# Patient Record
Sex: Male | Born: 1970 | Race: White | Hispanic: No | Smoking: Former smoker
Health system: Southern US, Community
[De-identification: ages and names within clinical notes are randomized; demographics above are authoritative.]

## PROBLEM LIST (undated history)

## (undated) DIAGNOSIS — E559 Vitamin D deficiency, unspecified: Secondary | ICD-10-CM

## (undated) DIAGNOSIS — G4730 Sleep apnea, unspecified: Secondary | ICD-10-CM

## (undated) DIAGNOSIS — E291 Testicular hypofunction: Secondary | ICD-10-CM

## (undated) DIAGNOSIS — K219 Gastro-esophageal reflux disease without esophagitis: Secondary | ICD-10-CM

## (undated) DIAGNOSIS — I4891 Unspecified atrial fibrillation: Secondary | ICD-10-CM

## (undated) DIAGNOSIS — E785 Hyperlipidemia, unspecified: Secondary | ICD-10-CM

## (undated) HISTORY — DX: Testicular hypofunction: E29.1

## (undated) HISTORY — PX: COLONOSCOPY: SHX174

## (undated) HISTORY — DX: Vitamin D deficiency, unspecified: E55.9

## (undated) HISTORY — DX: Gastro-esophageal reflux disease without esophagitis: K21.9

## (undated) HISTORY — DX: Sleep apnea, unspecified: G47.30

## (undated) HISTORY — DX: Hyperlipidemia, unspecified: E78.5

---

## 2005-11-08 ENCOUNTER — Ambulatory Visit (HOSPITAL_COMMUNITY): Admission: RE | Admit: 2005-11-08 | Discharge: 2005-11-08 | Payer: Self-pay | Admitting: Internal Medicine

## 2009-01-04 HISTORY — PX: SHOULDER SURGERY: SHX246

## 2010-08-28 ENCOUNTER — Other Ambulatory Visit: Payer: Self-pay | Admitting: Internal Medicine

## 2010-08-28 ENCOUNTER — Ambulatory Visit
Admission: RE | Admit: 2010-08-28 | Discharge: 2010-08-28 | Disposition: A | Discharge status: Home / Self Care | Payer: Commercial Managed Care - PPO | Source: Ambulatory Visit | Attending: Internal Medicine | Admitting: Internal Medicine

## 2010-08-28 DIAGNOSIS — M79642 Pain in left hand: Secondary | ICD-10-CM

## 2010-08-28 DIAGNOSIS — M79641 Pain in right hand: Secondary | ICD-10-CM

## 2011-02-16 ENCOUNTER — Encounter: Payer: Self-pay | Admitting: Gastroenterology

## 2011-03-01 ENCOUNTER — Ambulatory Visit: Payer: Commercial Managed Care - PPO | Admitting: Gastroenterology

## 2011-03-05 LAB — HM COLONOSCOPY

## 2011-03-09 ENCOUNTER — Encounter: Payer: Self-pay | Admitting: Gastroenterology

## 2011-03-09 ENCOUNTER — Ambulatory Visit (INDEPENDENT_AMBULATORY_CARE_PROVIDER_SITE_OTHER): Payer: Commercial Managed Care - PPO | Admitting: Gastroenterology

## 2011-03-09 VITALS — BP 122/80 | HR 76 | Ht 73.0 in | Wt 228.0 lb

## 2011-03-09 DIAGNOSIS — K625 Hemorrhage of anus and rectum: Secondary | ICD-10-CM

## 2011-03-09 DIAGNOSIS — Z809 Family history of malignant neoplasm, unspecified: Secondary | ICD-10-CM

## 2011-03-09 MED ORDER — PEG-KCL-NACL-NASULF-NA ASC-C 100 G PO SOLR: 1.0000 | ORAL | Status: DC

## 2011-03-09 NOTE — Progress Notes (Signed)
HPI: This is a   very pleasant 41 year old man whom I am meeting for the first time today  Uncle died at at 49.  Dad had precancerous polpys starting at age 54, he's had several colonoscopies since then and other polyps removed. He has noted some red blood in stool on very rare occasions. No other known family members with colon troubles.  HE has always had intermittent gas pains, improved with BMs.  Can be severe at times.     Review of systems: Pertinent positive and negative review of systems were noted in the above HPI section. Complete review of systems was performed and was otherwise normal.    Past Medical History  Diagnosis Date  . Hyperlipidemia   . GERD (gastroesophageal reflux disease)   . Hypertension     Past Surgical History  Procedure Date  . Shoulder surgery 2011    Current Outpatient Prescriptions  Medication Sig Dispense Refill  . lansoprazole (PREVACID) 30 MG capsule Take 30 mg by mouth daily.      . rosuvastatin (CRESTOR) 20 MG tablet Take 20 mg by mouth daily.        Allergies as of 03/09/2011  . (No Known Allergies)    Family History  Problem Relation Age of Onset  . Colon polyps Father   . Colon cancer Paternal Uncle     History   Social History  . Marital Status: Single    Spouse Name: N/A    Number of Children: N/A  . Years of Education: N/A   Occupational History  . Golf Corporate investment banker   Social History Main Topics  . Smoking status: Former Games developer  . Smokeless tobacco: Never Used  . Alcohol Use: Yes  . Drug Use: No  . Sexually Active: Not on file   Other Topics Concern  . Not on file   Social History Narrative  . No narrative on file       Physical Exam: BP 122/80  Pulse 76  Ht 6\' 1"  (1.854 m)  Wt 228 lb (103.42 kg)  BMI 30.08 kg/m2 Constitutional: generally well-appearing Psychiatric: alert and oriented x3 Eyes: extraocular movements intact Mouth: oral pharynx moist, no lesions Neck: supple no  lymphadenopathy Cardiovascular: heart regular rate and rhythm Lungs: clear to auscultation bilaterally Abdomen: soft, nontender, nondistended, no obvious ascites, no peritoneal signs, normal bowel sounds Extremities: no lower extremity edema bilaterally Skin: no lesions on visible extremities    Assessment and plan: 41 y.o. male with  minor intermittent rectal bleeding, family history of colon cancer, colon polyps  He has had minor rectal bleeding and is at slightly increased risk for colon cancer given his family history. We will proceed with colonoscopy at his soonest convenience. I see no reason for any further blood tests or imaging studies prior to then.

## 2011-03-09 NOTE — Patient Instructions (Signed)
Please start taking citrucel (orange flavored) powder fiber supplement.  This may cause some bloating at first but that usually goes away. Begin with a small spoonful and work your way up to a large, heaping spoonful daily over a week. You will be set up for a colonoscopy for minor rectal bleeding, FH of colon cancer, colon polyps.

## 2011-03-17 ENCOUNTER — Other Ambulatory Visit: Payer: Commercial Managed Care - PPO | Admitting: Gastroenterology

## 2011-03-31 ENCOUNTER — Other Ambulatory Visit: Payer: Commercial Managed Care - PPO | Admitting: Gastroenterology

## 2011-04-27 ENCOUNTER — Ambulatory Visit (AMBULATORY_SURGERY_CENTER): Payer: Commercial Managed Care - PPO | Admitting: Gastroenterology

## 2011-04-27 ENCOUNTER — Encounter: Payer: Self-pay | Admitting: Gastroenterology

## 2011-04-27 VITALS — BP 131/81 | HR 58 | Temp 98.3°F | Resp 18 | Ht 73.0 in | Wt 228.0 lb

## 2011-04-27 DIAGNOSIS — Z809 Family history of malignant neoplasm, unspecified: Secondary | ICD-10-CM

## 2011-04-27 DIAGNOSIS — K625 Hemorrhage of anus and rectum: Secondary | ICD-10-CM

## 2011-04-27 MED ORDER — SODIUM CHLORIDE 0.9 % IV SOLN: 500.0000 mL | INTRAVENOUS | Status: DC

## 2011-04-27 NOTE — Progress Notes (Signed)
Patient did not have preoperative order for IV antibiotic SSI prophylaxis. (G8918)  Patient did not experience any of the following events: a burn prior to discharge; a fall within the facility; wrong site/side/patient/procedure/implant event; or a hospital transfer or hospital admission upon discharge from the facility. (G8907)  

## 2011-04-27 NOTE — Op Note (Signed)
[facility] [address] [place], [place]  [address]  COLONOSCOPY PROCEDURE REPORT  PATIENT:  Michael Kim, Michael Kim  MR#:  [id number] BIRTHDATE:  Jul 15, 1970, 41 yrs. old  GENDER:  male ENDOSCOPIST:  Rachael Fee, MD REF. BY:  Neta Mends. Panosh, M.D. PROCEDURE DATE:  04/27/2011 PROCEDURE:  Colonoscopy 72536 ASA CLASS:  Class II INDICATIONS:  Elevated Risk Screening father with adenomatous polyps at young age, uncle with colon cancer MEDICATIONS:   Fentanyl 75 mcg IV, These medications were titrated to patient response per physician's verbal order, Versed 7 mg IV  DESCRIPTION OF PROCEDURE:   After the risks benefits and alternatives of the procedure were thoroughly explained, informed consent was obtained.  Digital rectal exam was performed and revealed no rectal masses.   The LB [id number] endoscope was introduced through the anus and advanced to the cecum, which was identified by both the appendix and ileocecal valve, without limitations.  The quality of the prep was good..  The instrument was then slowly withdrawn as the colon was fully examined. <<PROCEDUREIMAGES>> FINDINGS:  A normal appearing cecum, ileocecal valve, and appendiceal orifice were identified. The ascending, hepatic flexure, transverse, splenic flexure, descending, sigmoid colon, and rectum appeared unremarkable (see image2, image3, and image4). Retroflexed views in the rectum revealed no abnormalities. COMPLICATIONS:  None  ENDOSCOPIC IMPRESSION: 1) Normal colon 2) No polyps or cancers  RECOMMENDATIONS: 1) Given your significant family history of colon cancer, colon polyps, you should have a repeat colonoscopy in 5 years  REPEAT EXAM:  5 years  ______________________________ Rachael Fee, MD  n. eSIGNED:   Rachael Fee at 04/27/2011 01:41 PM  Zena Amos, [phone]

## 2011-04-27 NOTE — Patient Instructions (Addendum)
Normal colon, no polyps or cancers.  Recall colonoscopy in 5 years.  See below for d/c instructions.  YOU HAD AN ENDOSCOPIC PROCEDURE TODAY AT THE [facility]: Refer to the procedure report that was given to you for any specific questions about what was found during the examination.  If the procedure report does not answer your questions, please call your gastroenterologist to clarify.  If you requested that your care partner not be given the details of your procedure findings, then the procedure report has been included in a sealed envelope for you to review at your convenience later.  YOU SHOULD EXPECT: Some feelings of bloating in the abdomen. Passage of more gas than usual.  Walking can help get rid of the air that was put into your GI tract during the procedure and reduce the bloating. If you had a lower endoscopy (such as a colonoscopy or flexible sigmoidoscopy) you may notice spotting of blood in your stool or on the toilet paper. If you underwent a bowel prep for your procedure, then you may not have a normal bowel movement for a few days.  DIET: Your first meal following the procedure should be a light meal and then it is ok to progress to your normal diet.  A half-sandwich or bowl of soup is an example of a good first meal.  Heavy or fried foods are harder to digest and may make you feel nauseous or bloated.  Likewise meals heavy in dairy and vegetables can cause extra gas to form and this can also increase the bloating.  Drink plenty of fluids but you should avoid alcoholic beverages for 24 hours.  ACTIVITY: Your care partner should take you home directly after the procedure.  You should plan to take it easy, moving slowly for the rest of the day.  You can resume normal activity the day after the procedure however you should NOT DRIVE or use heavy machinery for 24 hours (because of the sedation medicines used during the test).     SYMPTOMS TO REPORT IMMEDIATELY: A  gastroenterologist can be reached at any hour.  During normal business hours, 8:30 AM to 5:00 PM Monday through Friday, call [phone].  After hours and on weekends, please call the GI answering service at [phone] who will take a message and have the physician on call contact you.   Following lower endoscopy (colonoscopy or flexible sigmoidoscopy):  Excessive amounts of blood in the stool  Significant tenderness or worsening of abdominal pains  Swelling of the abdomen that is new, acute  Fever of 100F or higher  FOLLOW UP: If any biopsies were taken you will be contacted by phone or by letter within the next 1-3 weeks.  Call your gastroenterologist if you have not heard about the biopsies in 3 weeks.  Our staff will call the home number listed on your records the next business day following your procedure to check on you and address any questions or concerns that you may have at that time regarding the information given to you following your procedure. This is a courtesy call and so if there is no answer at the home number and we have not heard from you through the emergency physician on call, we will assume that you have returned to your regular daily activities without incident.  SIGNATURES/CONFIDENTIALITY: You and/or your care partner have signed paperwork which will be entered into your electronic medical record.  These signatures attest to the fact that that the information  above on your After Visit Summary has been reviewed and is understood.  Full responsibility of the confidentiality of this discharge information lies with you and/or your care-partner.  

## 2011-04-28 ENCOUNTER — Telehealth: Payer: Self-pay

## 2011-04-28 NOTE — Telephone Encounter (Signed)
  Follow up Call-  Call back number 04/27/2011  Post procedure Call Back phone  # [phone]  Permission to leave phone message Yes     Patient questions:  Do you have a fever, pain , or abdominal swelling? no Pain Score  0 *  Have you tolerated food without any problems? yes  Have you been able to return to your normal activities? yes  Do you have any questions about your discharge instructions: Diet   no Medications  no Follow up visit  no  Do you have questions or concerns about your Care? no  Actions: * If pain score is 4 or above: No action needed, pain <4.

## 2011-07-23 ENCOUNTER — Ambulatory Visit (INDEPENDENT_AMBULATORY_CARE_PROVIDER_SITE_OTHER): Payer: Commercial Managed Care - PPO | Admitting: Emergency Medicine

## 2011-07-23 VITALS — BP 118/76 | HR 59 | Temp 99.1°F | Resp 16 | Ht 71.0 in | Wt 210.0 lb

## 2011-07-23 DIAGNOSIS — K625 Hemorrhage of anus and rectum: Secondary | ICD-10-CM

## 2011-07-23 NOTE — Addendum Note (Signed)
Addended by: Maryann Alar on: 07/23/2011 01:01 PM   Modules accepted: Orders

## 2011-07-23 NOTE — Progress Notes (Signed)
  Subjective:    Patient ID: Michael Kim, male    DOB: 1970-05-08, 41 y.o.   MRN: [id number]  Rectal Bleeding  The current episode started yesterday. The problem occurs rarely. The patient is experiencing no pain. The stool is described as streaked with blood. There was no prior successful therapy. There was no prior unsuccessful therapy. Pertinent negatives include no anorexia, no fever, no abdominal pain, no hematemesis, no hemorrhoids, no nausea, no rectal pain, no hematuria, no headaches, no coughing and no difficulty breathing. The last void occurred less than 6 hours ago. His past medical history does not include abdominal surgery, developmental delay, Hirschsprung's disease, inflammatory bowel disease, recent abdominal injury, recent antibiotic use, recent change in diet or a recent illness. There were no sick contacts. He has received no recent medical care.      Review of Systems  Constitutional: Negative.  Negative for fever.  HENT: Negative.   Eyes: Negative.   Respiratory: Negative.  Negative for cough.   Cardiovascular: Negative.   Gastrointestinal: Positive for blood in stool and hematochezia. Negative for nausea, abdominal pain, rectal pain, anorexia, hematemesis and hemorrhoids.  Genitourinary: Negative.  Negative for hematuria.  Musculoskeletal: Negative.   Skin: Negative.   Neurological: Negative for headaches.       Objective:   Physical Exam  Constitutional: He is oriented to person, place, and time. He appears well-developed and well-nourished. No distress.  HENT:  Head: Normocephalic and atraumatic.  Eyes: Conjunctivae are normal. Pupils are equal, round, and reactive to light. No scleral icterus.  Neck: Normal range of motion. Neck supple.  Cardiovascular: Normal rate and regular rhythm.   Pulmonary/Chest: Effort normal.  Abdominal: Soft. There is no tenderness.  Genitourinary: Rectum normal.  Musculoskeletal: Normal range of motion.  Neurological: He is  alert and oriented to person, place, and time.  Skin: Skin is warm and dry.          Assessment & Plan:  Bright red rectal bleeding at onset of stool. Follow up with gi for consultation

## 2011-07-23 NOTE — Patient Instructions (Signed)
Rectal Bleeding  Rectal bleeding is when blood passes out of the anus. It is usually a sign that something is wrong. It may not be serious, but it should always be evaluated. Rectal bleeding may present as bright red blood or extremely dark stools. The color may range from dark red or maroon to black (like tar). It is important that the cause of rectal bleeding be identified so treatment can be started and the problem corrected.  CAUSES    Hemorrhoids. These are enlarged (dilated) blood vessels or veins in the anal or rectal area.   Fistulas. Theseare abnormal, burrowing channels that usually run from inside the rectum to the skin around the anus. They can bleed.   Anal fissures. This is a tear in the tissue of the anus. Bleeding occurs with bowel movements.   Diverticulosis. This is a condition in which pockets or sacs project from the bowel wall. Occasionally, the sacs can bleed.   Diverticulitis. Thisis an infection involving diverticulosis of the colon.   Proctitis and colitis. These are conditions in which the rectum, colon, or both, can become inflamed and pitted (ulcerated).   Polyps and cancer. Polyps are non-cancerous (benign) growths in the colon that may bleed. Certain types of polyps turn into cancer.   Protrusion of the rectum. Part of the rectum can project from the anus and bleed.   Certain medicines.   Intestinal infections.   Blood vessel abnormalities.  HOME CARE INSTRUCTIONS   Eat a high-fiber diet to keep your stool soft.   Limit activity.   Drink enough fluids to keep your urine clear or pale yellow.   Warm baths may be useful to soothe rectal pain.   Follow up with your caregiver as directed.  SEEK IMMEDIATE MEDICAL CARE IF:   You develop increased bleeding.   You have black or dark red stools.   You vomit blood or material that looks like coffee grounds.   You have abdominal pain or tenderness.   You have a fever.   You feel weak, nauseous, or you faint.   You have  severe rectal pain or you are unable to have a bowel movement.  MAKE SURE YOU:   Understand these instructions.   Will watch your condition.   Will get help right away if you are not doing well or get worse.  Document Released: 06/12/2001 Document Revised: 12/10/2010 Document Reviewed: 06/07/2010  ExitCare Patient Information 2012 [employer].

## 2011-07-26 ENCOUNTER — Telehealth: Payer: Self-pay | Admitting: Gastroenterology

## 2011-07-26 NOTE — Telephone Encounter (Signed)
Colonoscopy 3 months ago was normal.  Likely minor hemorrhoid.  If bleeding continues to improve, nothing needs to be done.

## 2011-07-26 NOTE — Telephone Encounter (Signed)
Has had BRB per rectum since Thursday went to [facility] on friday and was told nothing abnormal was seen.  It is with every bowel movement but lessens with each one.  It colors the water and in the stool.  Appt was given for 08/18/11 with Dr Christella Hartigan but will be forwarded to Dr Christella Hartigan for review.  Dr Christella Hartigan the note from Brayton Mars is in [facility] 07/23/11  Please advise if anything further is recommended

## 2011-07-26 NOTE — Telephone Encounter (Signed)
Pt.notified

## 2011-07-29 ENCOUNTER — Ambulatory Visit (INDEPENDENT_AMBULATORY_CARE_PROVIDER_SITE_OTHER): Payer: Commercial Managed Care - PPO | Admitting: Internal Medicine

## 2011-07-29 ENCOUNTER — Ambulatory Visit: Payer: Commercial Managed Care - PPO

## 2011-07-29 VITALS — BP 122/84 | HR 57 | Temp 98.3°F | Resp 14 | Ht 71.0 in | Wt 208.0 lb

## 2011-07-29 DIAGNOSIS — T1490XA Injury, unspecified, initial encounter: Secondary | ICD-10-CM

## 2011-07-29 DIAGNOSIS — T5991XA Toxic effect of unspecified gases, fumes and vapors, accidental (unintentional), initial encounter: Secondary | ICD-10-CM

## 2011-07-29 DIAGNOSIS — R079 Chest pain, unspecified: Secondary | ICD-10-CM

## 2011-07-29 DIAGNOSIS — R059 Cough, unspecified: Secondary | ICD-10-CM

## 2011-07-29 DIAGNOSIS — R05 Cough: Secondary | ICD-10-CM

## 2011-07-29 DIAGNOSIS — R091 Pleurisy: Secondary | ICD-10-CM

## 2011-07-29 DIAGNOSIS — R0602 Shortness of breath: Secondary | ICD-10-CM

## 2011-07-29 MED ORDER — ALBUTEROL SULFATE HFA 108 (90 BASE) MCG/ACT IN AERS: 2.0000 | INHALATION_SPRAY | RESPIRATORY_TRACT | Status: DC | PRN

## 2011-07-29 NOTE — Progress Notes (Signed)
  Subjective:    Patient ID: Michael Kim, male    DOB: 26-Dec-1970, 41 y.o.   MRN: [id number]  HPI At home and working on swimming pool, opened chlorine tab container and whiff of chlorine gas was inhaled. Had immediate coughing, and wheezing, felt bad for a few hours. Now lungs feel 60%, has pleuritic cp,, Is wheezing, and normally is healthy.   Review of Systems     Objective:   Physical Exam  Nursing note and vitals reviewed. Constitutional: He is oriented to person, place, and time. He appears well-developed and well-nourished. No distress.  HENT:  Nose: Nose normal.  Mouth/Throat: Oropharynx is clear and moist.  Eyes: EOM are normal.  Cardiovascular: Normal rate, regular rhythm and normal heart sounds.   Pulmonary/Chest: Effort normal. He has wheezes. He has rales. He exhibits tenderness.  Neurological: He is alert and oriented to person, place, and time.  Skin: Skin is warm and dry. No rash noted.  Psychiatric: He has a normal mood and affect. His behavior is normal. Judgment and thought content normal.   Appears well   PFR 420 nl is low nl is 610  Will do neb with albuterol and atrovent  Post neb pfr 560 and feels a lot better    [facility] reading (PRIMARY) by  Dr.Cherron Blitzer. Chlorine inhalation, cxr nl   Assessment & Plan:  Chlorine inhalation injury Bronchospasm. Warned to rtc if returns

## 2011-07-29 NOTE — Patient Instructions (Addendum)
Chemical Inhalation  Your caregiver has diagnosed you as suffering from chemical inhalation. Inhaling chemical gas is dangerous. It causes an irritation of the linings of the breathing tubes within the lungs. If this irritation or reaction to the chemical is bad, it can cause difficulty breathing.  Do not return to the area of the chemical exposure until authorities tell you it is safe.  Chemical inhalation is often treated with observation. Observation may often be carried out at home. If the reaction has been severe, hospitalization may be required. Sometimes anti-inflammatory medicines are needed. These are medicines which decrease the inflammation in the lungs. If hospitalization is required, you will need to remain in the hospital until your breathing is close to normal and it is safe for you to go home.  SEEK IMMEDIATE MEDICAL CARE IF:    You have a fever.   You have wheezing, difficulty breathing, continuous cough, nausea, or vomiting.   You have shortness of breath with your usual activities, or your heart seems to beat too fast with minimal exercise.   You become confused, irritable, or unusually sleepy. Have someone drive you to the emergency department or call 911. Do not drive yourself. A re-check will determine if hospitalization is needed for closer observation or treatment.  Document Released: 09/15/2000 Document Revised: 12/10/2010 Document Reviewed: 04/24/2007  ExitCare Patient Information 2012 [employer].

## 2011-08-18 ENCOUNTER — Ambulatory Visit: Payer: Commercial Managed Care - PPO | Admitting: Gastroenterology

## 2012-01-05 HISTORY — PX: SHOULDER SURGERY: SHX246

## 2012-02-28 ENCOUNTER — Other Ambulatory Visit (HOSPITAL_COMMUNITY): Payer: Self-pay | Admitting: Internal Medicine

## 2012-02-28 ENCOUNTER — Ambulatory Visit (HOSPITAL_COMMUNITY)
Admission: RE | Admit: 2012-02-28 | Discharge: 2012-02-28 | Disposition: A | Discharge status: Home / Self Care | Payer: Commercial Managed Care - PPO | Source: Ambulatory Visit | Attending: Internal Medicine | Admitting: Internal Medicine

## 2012-02-28 DIAGNOSIS — R05 Cough: Secondary | ICD-10-CM | POA: Insufficient documentation

## 2012-02-28 DIAGNOSIS — R059 Cough, unspecified: Secondary | ICD-10-CM | POA: Insufficient documentation

## 2012-02-28 DIAGNOSIS — R079 Chest pain, unspecified: Secondary | ICD-10-CM | POA: Insufficient documentation

## 2012-06-26 ENCOUNTER — Ambulatory Visit (INDEPENDENT_AMBULATORY_CARE_PROVIDER_SITE_OTHER): Payer: Commercial Managed Care - PPO | Admitting: Family Medicine

## 2012-06-26 VITALS — BP 124/82 | HR 66 | Temp 98.0°F | Resp 18 | Ht 72.5 in | Wt 215.0 lb

## 2012-06-26 DIAGNOSIS — W57XXXA Bitten or stung by nonvenomous insect and other nonvenomous arthropods, initial encounter: Secondary | ICD-10-CM

## 2012-06-26 DIAGNOSIS — S20169A Insect bite (nonvenomous) of breast, unspecified breast, initial encounter: Secondary | ICD-10-CM

## 2012-06-26 DIAGNOSIS — Z8614 Personal history of Methicillin resistant Staphylococcus aureus infection: Secondary | ICD-10-CM

## 2012-06-26 DIAGNOSIS — L089 Local infection of the skin and subcutaneous tissue, unspecified: Secondary | ICD-10-CM

## 2012-06-26 MED ORDER — SULFAMETHOXAZOLE-TRIMETHOPRIM 800-160 MG PO TABS: 2.0000 | ORAL_TABLET | Freq: Two times a day (BID) | ORAL | Status: DC

## 2012-06-26 NOTE — Patient Instructions (Addendum)
1.  Apply ice to area twice daily. 2 .  Take Allegra once daily for one week. 3. Take Benadryl at bedtime for next week. 4. Apply hydrocortisone cream 1% to insect bite three times daily. 5.  Return for increased redness, swelling, pain, fever > 100.5.

## 2012-06-26 NOTE — Progress Notes (Signed)
[address]   [place], [place]  [address]   [phone]  Subjective:    Patient ID: Michael Kim, male    DOB: 15-Mar-1970, 42 y.o.   MRN: [id number]  HPI This 42 y.o. male presents for evaluation of possible MRSA infection on back.  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 Sat down with wife; had spot on back; thought insect bite.  Painful mildly.  +itchy.  No fevers/chills/sweats.  History of recurrent poison ivy.  No malaise or fatigue.  No drainage.  Tetanus UTD.  PCP:  [facility]   Review of Systems  Constitutional: Negative for fever, chills, diaphoresis and fatigue.  Skin: Positive for color change, rash and wound.    Past Medical History  Diagnosis Date  . Hyperlipidemia   . GERD (gastroesophageal reflux disease)   . Hypertension     Past Surgical History  Procedure Laterality Date  . Shoulder surgery  2011    Prior to Admission medications   Medication Sig Start Date End Date Taking? Authorizing Provider  lansoprazole (PREVACID) 30 MG capsule Take 30 mg by mouth daily.   Yes Historical Provider, MD  Multiple Vitamin (MULTIVITAMIN) tablet Take 1 tablet by mouth daily.   Yes Historical Provider, MD  rosuvastatin (CRESTOR) 20 MG tablet Take 20 mg by mouth daily.   Yes Historical Provider, MD  Vitamin D, Ergocalciferol, (DRISDOL) 50000 UNITS CAPS Take 50,000 Units by mouth.   Yes Historical Provider, MD  albuterol (PROVENTIL HFA;VENTOLIN HFA) 108 (90 BASE) MCG/ACT inhaler Inhale 2 puffs into the lungs every 4 (four) hours as needed for wheezing. 07/29/11 07/28/12  Jonita Albee, MD  psyllium (METAMUCIL) 58.6 % powder Take 1 packet by mouth 3 (three) times daily.    Historical Provider, MD  sulfamethoxazole-trimethoprim (BACTRIM DS,SEPTRA DS) 800-160 MG per tablet Take 2 tablets by mouth 2 (two) times daily. 06/26/12   Ethelda Chick, MD    No Known Allergies  History   Social History  . Marital Status: Married    Spouse Name: N/A    Number of Children: N/A  . 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 Smoking status: Former Smoker    Quit date: 07/29/2006  . Smokeless tobacco: Never Used  . Alcohol Use: 9.0 oz/week    15 Cans of beer per week  . Drug Use: No  . Sexually Active: Not on file   Other Topics Concern  . Not on file   Social History Narrative  . No narrative on file    Family History  Problem Relation Age of Onset  . Colon polyps Father   . Colon cancer Paternal Uncle   . Lymphoma Mother        Objective:   Physical Exam  Nursing note and vitals reviewed. Constitutional: He is oriented to person, place, and time. He appears well-developed and well-nourished. No distress.  Neurological: He is alert and oriented to person, place, and time.  Skin: Skin is warm and dry. Rash noted. He is not diaphoretic. There is erythema.  L mid back with two 4mm vesicular lesions with surrounding swelling 1 cm and erythema.  No fluctuants; non-tender.  Psychiatric: He has a normal mood and affect. His behavior is normal.       Assessment & Plan:   History of MRSA infection - Plan: sulfamethoxazole-trimethoprim (BACTRIM DS,SEPTRA DS) 800-160 MG per tablet  Insect  bite   1.  Insect bite upper back:  New.  With localized allergic reaction.  Recommend allegra daily for seven days; Benadryl qhs for one week; apply hydrocortisone to area tid for next week.  Ice wound bid for next five days. 2. History of MRSA:  Stable; no evidence of recurrent MRSA infection; rx for Bactrim provided to start if wound becomes painful. Tetanus UTD.

## 2012-11-16 ENCOUNTER — Other Ambulatory Visit: Payer: Self-pay | Admitting: Internal Medicine

## 2012-11-16 MED ORDER — ROSUVASTATIN CALCIUM 20 MG PO TABS: 20.0000 mg | ORAL_TABLET | Freq: Every day | ORAL | Status: DC

## 2012-11-16 MED ORDER — TESTOSTERONE CYPIONATE 200 MG/ML IM SOLN: 200.0000 mg | INTRAMUSCULAR | Status: DC

## 2012-11-21 ENCOUNTER — Encounter: Payer: Self-pay | Admitting: *Deleted

## 2012-11-21 ENCOUNTER — Other Ambulatory Visit: Payer: Commercial Managed Care - PPO | Admitting: *Deleted

## 2012-11-21 ENCOUNTER — Encounter (INDEPENDENT_AMBULATORY_CARE_PROVIDER_SITE_OTHER): Payer: Self-pay

## 2012-11-21 VITALS — BP 138/98 | HR 62 | Temp 98.2°F | Resp 18 | Ht 73.0 in | Wt 222.0 lb

## 2012-11-21 DIAGNOSIS — E291 Testicular hypofunction: Secondary | ICD-10-CM

## 2012-11-21 MED ORDER — TESTOSTERONE CYPIONATE 200 MG/ML IM SOLN
300.0000 mg | Freq: Once | INTRAMUSCULAR | Status: AC
  Administered 2012-11-21: 300 mg via INTRAMUSCULAR

## 2012-11-23 ENCOUNTER — Other Ambulatory Visit: Payer: Self-pay | Admitting: Emergency Medicine

## 2012-11-23 MED ORDER — TESTOSTERONE CYPIONATE 200 MG/ML IM SOLN: 200.0000 mg | INTRAMUSCULAR | Status: DC

## 2012-11-23 MED ORDER — ROSUVASTATIN CALCIUM 20 MG PO TABS: 20.0000 mg | ORAL_TABLET | Freq: Every day | ORAL | Status: DC

## 2012-12-04 ENCOUNTER — Telehealth: Payer: Self-pay | Admitting: *Deleted

## 2012-12-04 DIAGNOSIS — K625 Hemorrhage of anus and rectum: Secondary | ICD-10-CM

## 2012-12-04 NOTE — Telephone Encounter (Signed)
Pt is calling says that before he was told by Michael Kim if he ever continued to have blood in stool again to call the office right away. Pt is calling to let us know this & find out next step? Rather it be to be referred to a specialist?

## 2012-12-06 ENCOUNTER — Ambulatory Visit (INDEPENDENT_AMBULATORY_CARE_PROVIDER_SITE_OTHER): Payer: Commercial Managed Care - PPO | Admitting: Gastroenterology

## 2012-12-06 ENCOUNTER — Encounter: Payer: Self-pay | Admitting: Gastroenterology

## 2012-12-06 VITALS — BP 134/80 | HR 72 | Ht 71.0 in | Wt 221.0 lb

## 2012-12-06 DIAGNOSIS — K625 Hemorrhage of anus and rectum: Secondary | ICD-10-CM

## 2012-12-06 NOTE — Progress Notes (Signed)
Review of pertinent gastrointestinal problems: 1. a family history of adenomatous colon polyps (father), uncle with colon cancer. Colonoscopy April 2013, Dr. Christella Hartigan, was normal. He was recommended to have repeat colonoscopy in 5 years    HPI: This is a   very pleasant 41 year old man whom I last saw a little over a year ago for colon cancer screening. He is here today for a new problem.  Shortly after colonoscopy in 2013 he had minor rectal bleeding.  Went to PCP.  Was told to monitor it.  Has bright red rectal bleeding, last 1-2 days at a time.  NO anal pain.  Overall weight has been increasing (common in the winter for him).  Tried to stop pushing, straining and that helped.  Was taking metamucil in past, he loved it.     Review of systems: Pertinent positive and negative review of systems were noted in the above HPI section. Complete review of systems was performed and was otherwise normal.    Past Medical History  Diagnosis Date  . Hyperlipidemia   . GERD (gastroesophageal reflux disease)   . Hypertension     Past Surgical History  Procedure Laterality Date  . Shoulder surgery  2011    Current Outpatient Prescriptions  Medication Sig Dispense Refill  . Multiple Vitamin (MULTIVITAMIN) tablet Take 1 tablet by mouth daily.      . rosuvastatin (CRESTOR) 20 MG tablet Take 1 tablet (20 mg total) by mouth daily.  30 tablet  3  . testosterone cypionate (DEPOTESTOTERONE CYPIONATE) 200 MG/ML injection Inject 1 mL (200 mg total) into the muscle every 14 (fourteen) days.  10 mL  1  . Vitamin D, Ergocalciferol, (DRISDOL) 50000 UNITS CAPS Take 50,000 Units by mouth.       No current facility-administered medications for this visit.    Allergies as of 12/06/2012 - Review Complete 12/06/2012  Allergen Reaction Noted  . Pravastatin  11/21/2012    Family History  Problem Relation Age of Onset  . Colon polyps Father   . Colon cancer Paternal Uncle   . Lymphoma Mother     History    Social History  . Marital Status: Married    Spouse Name: N/A    Number of Children: N/A  . Years of Education: N/A   Occupational History  . [employer] investment [employer]   Social History Main Topics  . Smoking status: Former Smoker    Quit date: 07/29/2006  . Smokeless tobacco: Never Used  . Alcohol Use: 9.0 oz/week    15 Cans of beer per week  . Drug Use: No  . Sexual Activity: Not on file   Other Topics Concern  . Not on file   Social History Narrative  . No narrative on file       Physical Exam: BP 134/80  Pulse 72  Ht 5\' 11"  (1.803 m)  Wt 221 lb (100.245 kg)  BMI 30.84 kg/m2 Constitutional: generally well-appearing Psychiatric: alert and oriented x3 Eyes: extraocular movements intact Mouth: oral pharynx moist, no lesions Neck: supple no lymphadenopathy Cardiovascular: heart regular rate and rhythm Lungs: clear to auscultation bilaterally Abdomen: soft, nontender, nondistended, no obvious ascites, no peritoneal signs, normal bowel sounds Extremities: no lower extremity edema bilaterally Skin: no lesions on visible extremities Rectal examination: External anus was normal, no external hemorrhoids no fissures. Internal he felt a smooth nodule, this was about a centimeter across or less, no distal rectal masses, stool was brown and not check for  Hemoccult.   Assessment and plan: 42 y.o. male with  minor anorectal bleeding, likely from small internal hemorrhoid  Discussed the option of banding procedure with anoscopy by one of my partners however he is very reluctant to have procedures done anally. Special without sedation. I did recommend we least take a look by flexible sigmoidoscopy. A year ago I do not see your document any internal hemorrhoids I would just like to make sure not missing a distal rectal lesion such small polyp. I doubt that is the case. He is also going to resume fiber supplements daily.

## 2012-12-06 NOTE — Patient Instructions (Signed)
Restart daily metamucil. You will be set up for a flexible sigmoidoscopy (LEC, moderate sedation) for rectal bleeding.

## 2012-12-12 ENCOUNTER — Encounter: Payer: Self-pay | Admitting: Gastroenterology

## 2012-12-19 ENCOUNTER — Encounter: Payer: Self-pay | Admitting: *Deleted

## 2012-12-19 ENCOUNTER — Ambulatory Visit: Payer: Commercial Managed Care - PPO | Admitting: *Deleted

## 2012-12-19 VITALS — BP 128/82 | HR 68 | Temp 98.6°F | Resp 18 | Ht 73.0 in | Wt 225.0 lb

## 2012-12-19 DIAGNOSIS — E291 Testicular hypofunction: Secondary | ICD-10-CM

## 2012-12-19 MED ORDER — TESTOSTERONE CYPIONATE 200 MG/ML IM SOLN
300.0000 mg | Freq: Once | INTRAMUSCULAR | Status: AC
  Administered 2012-12-19: 300 mg via INTRAMUSCULAR

## 2013-01-09 ENCOUNTER — Ambulatory Visit (INDEPENDENT_AMBULATORY_CARE_PROVIDER_SITE_OTHER): Payer: Commercial Managed Care - PPO

## 2013-01-09 DIAGNOSIS — E291 Testicular hypofunction: Secondary | ICD-10-CM

## 2013-01-09 MED ORDER — TESTOSTERONE CYPIONATE 200 MG/ML IM SOLN
300.0000 mg | Freq: Once | INTRAMUSCULAR | Status: AC
  Administered 2013-01-09: 300 mg via INTRAMUSCULAR

## 2013-01-09 NOTE — Progress Notes (Signed)
Patient ID: Michael Kim, male   DOB: 1970-06-26, 43 y.o.   MRN: [id number] Patient here today for testosterone injection. Patient receoved 1.5 ml IM Right glut. Patient tolerated well.

## 2013-01-17 ENCOUNTER — Other Ambulatory Visit: Payer: Commercial Managed Care - PPO | Admitting: Gastroenterology

## 2013-01-30 ENCOUNTER — Ambulatory Visit (INDEPENDENT_AMBULATORY_CARE_PROVIDER_SITE_OTHER): Payer: Commercial Managed Care - PPO

## 2013-01-30 ENCOUNTER — Ambulatory Visit: Payer: Self-pay

## 2013-01-30 DIAGNOSIS — E291 Testicular hypofunction: Secondary | ICD-10-CM

## 2013-01-30 MED ORDER — TESTOSTERONE CYPIONATE 200 MG/ML IM SOLN
300.0000 mg | Freq: Once | INTRAMUSCULAR | Status: AC
  Administered 2013-01-30: 300 mg via INTRAMUSCULAR

## 2013-01-30 NOTE — Progress Notes (Signed)
Patient ID: Michael Kim, male   DOB: [birth year], 43 y.o.   MRN: [id number] Patient here today for testosterone injection, received 1.5 mls left glut. Patient tolerated well.

## 2013-01-31 ENCOUNTER — Ambulatory Visit: Payer: Self-pay

## 2013-02-19 ENCOUNTER — Ambulatory Visit: Payer: Self-pay

## 2013-02-19 ENCOUNTER — Ambulatory Visit (INDEPENDENT_AMBULATORY_CARE_PROVIDER_SITE_OTHER): Payer: Commercial Managed Care - PPO

## 2013-02-19 DIAGNOSIS — E291 Testicular hypofunction: Secondary | ICD-10-CM

## 2013-02-19 MED ORDER — TESTOSTERONE CYPIONATE 200 MG/ML IM SOLN
300.0000 mg | Freq: Once | INTRAMUSCULAR | Status: AC
  Administered 2013-02-19: 300 mg via INTRAMUSCULAR

## 2013-02-19 NOTE — Progress Notes (Signed)
Patient ID: Michael Kim, male   DOB: March 01, 1970, 44 y.o.   MRN: [id number] Patient here today for testosterone injection, received 1.5 mls right glute, patient tolerated well.

## 2013-02-25 ENCOUNTER — Encounter: Payer: Self-pay | Admitting: Emergency Medicine

## 2013-02-25 DIAGNOSIS — E349 Endocrine disorder, unspecified: Secondary | ICD-10-CM | POA: Insufficient documentation

## 2013-02-25 DIAGNOSIS — E559 Vitamin D deficiency, unspecified: Secondary | ICD-10-CM | POA: Insufficient documentation

## 2013-02-28 ENCOUNTER — Encounter: Payer: Self-pay | Admitting: Emergency Medicine

## 2013-03-12 ENCOUNTER — Encounter (INDEPENDENT_AMBULATORY_CARE_PROVIDER_SITE_OTHER): Payer: Self-pay

## 2013-03-12 ENCOUNTER — Ambulatory Visit (INDEPENDENT_AMBULATORY_CARE_PROVIDER_SITE_OTHER): Payer: Commercial Managed Care - PPO

## 2013-03-12 DIAGNOSIS — E291 Testicular hypofunction: Secondary | ICD-10-CM

## 2013-03-12 MED ORDER — TESTOSTERONE CYPIONATE 200 MG/ML IM SOLN
300.0000 mg | Freq: Once | INTRAMUSCULAR | Status: AC
  Administered 2013-03-12: 300 mg via INTRAMUSCULAR

## 2013-03-12 NOTE — Progress Notes (Signed)
Patient ID: Michael Kim, male   DOB: [birth year], 42 y.o.   MRN: [id number] Patient here today to receive testosterone injection. Patient received 1.5 ml  to left glut. Patient tolerated well.

## 2013-03-19 ENCOUNTER — Other Ambulatory Visit: Payer: Self-pay | Admitting: Emergency Medicine

## 2013-03-19 MED ORDER — TESTOSTERONE CYPIONATE 200 MG/ML IM SOLN: 200.0000 mg | INTRAMUSCULAR | Status: DC

## 2013-04-02 ENCOUNTER — Ambulatory Visit: Payer: Self-pay

## 2013-04-03 ENCOUNTER — Encounter: Payer: Self-pay | Admitting: Emergency Medicine

## 2013-04-03 ENCOUNTER — Ambulatory Visit (INDEPENDENT_AMBULATORY_CARE_PROVIDER_SITE_OTHER): Payer: Commercial Managed Care - PPO | Admitting: Emergency Medicine

## 2013-04-03 VITALS — BP 144/90 | HR 64 | Temp 98.2°F | Resp 18 | Ht 73.0 in | Wt 222.0 lb

## 2013-04-03 DIAGNOSIS — Z8349 Family history of other endocrine, nutritional and metabolic diseases: Secondary | ICD-10-CM

## 2013-04-03 DIAGNOSIS — E782 Mixed hyperlipidemia: Secondary | ICD-10-CM

## 2013-04-03 DIAGNOSIS — R239 Unspecified skin changes: Secondary | ICD-10-CM

## 2013-04-03 DIAGNOSIS — Z1212 Encounter for screening for malignant neoplasm of rectum: Secondary | ICD-10-CM

## 2013-04-03 DIAGNOSIS — Z111 Encounter for screening for respiratory tuberculosis: Secondary | ICD-10-CM

## 2013-04-03 DIAGNOSIS — E291 Testicular hypofunction: Secondary | ICD-10-CM

## 2013-04-03 DIAGNOSIS — Z0000 Encounter for general adult medical examination without abnormal findings: Secondary | ICD-10-CM

## 2013-04-03 LAB — CBC WITH DIFFERENTIAL/PLATELET
BASOS PCT: 0 % (ref 0–1)
Basophils Absolute: 0 10*3/uL (ref 0.0–0.1)
Eosinophils Absolute: 0.1 10*3/uL (ref 0.0–0.7)
Eosinophils Relative: 2 % (ref 0–5)
HCT: 46.6 % (ref 39.0–52.0)
HEMOGLOBIN: 16.4 g/dL (ref 13.0–17.0)
Lymphocytes Relative: 41 % (ref 12–46)
Lymphs Abs: 2.9 10*3/uL (ref 0.7–4.0)
MCH: 32.7 pg (ref 26.0–34.0)
MCHC: 35.2 g/dL (ref 30.0–36.0)
MCV: 92.8 fL (ref 78.0–100.0)
MONOS PCT: 10 % (ref 3–12)
Monocytes Absolute: 0.7 10*3/uL (ref 0.1–1.0)
NEUTROS ABS: 3.3 10*3/uL (ref 1.7–7.7)
Neutrophils Relative %: 47 % (ref 43–77)
Platelets: 153 10*3/uL (ref 150–400)
RBC: 5.02 MIL/uL (ref 4.22–5.81)
RDW: 13.3 % (ref 11.5–15.5)
WBC: 7.1 10*3/uL (ref 4.0–10.5)

## 2013-04-03 LAB — HEMOGLOBIN A1C
Hgb A1c MFr Bld: 5.6 % (ref <5.7)
Mean Plasma Glucose: 114 mg/dL (ref <117)

## 2013-04-03 MED ORDER — TESTOSTERONE CYPIONATE 200 MG/ML IM SOLN
300.0000 mg | Freq: Once | INTRAMUSCULAR | Status: AC
  Administered 2013-04-03: 300 mg via INTRAMUSCULAR

## 2013-04-03 NOTE — Patient Instructions (Signed)
Hemochromatosis You have hemochromatosis. This is when you have too much iron in your body. Iron is necessary in your body for many reasons. Iron works in the hemoglobin (a protein in red blood cells) which carries oxygen to your body. However, too much iron can lead to long-term health problems. When the iron builds up in the body it affects the liver, heart, and pancreas. This can lead to heart failure, diabetes, cirrhosis, cancer, impotence, and depression. Fatigue is a common complaint. Additional symptoms include headache, shortness of breath, heart irregularities, and tanning skin. The most common cause of this disease is hereditary (passed from parents). Another cause is a diet that contains too much iron. This can happen to people who eat a lot of red meat, take supplemental iron, take vitamin C, and drink alcohol. Frequent transfusions may also lead to iron overload. DIAGNOSIS  Three simple blood tests will usually make the diagnosis of hemochromatosis.  A transferrin iron saturation percentage test will measure your ability to circulate iron.  A ferritin test will measure the amount of iron you have in storage.  A hemoglobin test will measure how much iron is circulating in your blood. HOME CARE INSTRUCTIONS  Follow the diet provided for you by your caregiver. The recommended daily allowance for iron is:  15 mg per day for women ages 63 to 25.  30 mg per day for women who are pregnant or lactating.  10 mg per day for men and women ages 106 and older. It is important to follow your caregiver's instructions because you can lead a normal life with treatment. If left untreated, this disease can be life-threatening. MAKE SURE YOU:   Understand these instructions.  Will watch your condition.  Will get help right away if you are not doing well or get worse. Document Released: 12/19/1999 Document Revised: 03/15/2011 Document Reviewed: 12/21/2004 [facility] Patient Information 2014  [place].

## 2013-04-03 NOTE — Progress Notes (Signed)
Subjective:    Patient ID: Michael Kim, male    DOB: 12/25/1970, 43 y.o.   MRN: [id number]  HPI Comments: 43 yo WM CPE and presents for 3 month F/U for Elevated BP w/o HTN, Cholesterol, Pre-Dm, D. Deficient. He has been stressed today and thinks has elevated BP. He has been increasing activity with cardio/ yard work. He is eating healthy for the most part. He has been taking his cholesterol RX AD. He notes mild stress occasionally increases fatigue but resolve with rest. He notes testosterone has helped significantly with energy. LAST LABS T 200 TG 203 H 73 L 86  TESTOSTERONE 368 FROM 255 PSA .98   He denies any recent GERD episodes and has been off Prevacid x several months.   He notes mom was just diagnosed with hemochromatosis and was advised to get screened for the disorder. He denies nay unusual symptoms.   Hyperlipidemia   Current Outpatient Prescriptions on File Prior to Visit  Medication Sig Dispense Refill  . Multiple Vitamin (MULTIVITAMIN) tablet Take 1 tablet by mouth daily.      . rosuvastatin (CRESTOR) 20 MG tablet Take 1 tablet (20 mg total) by mouth daily.  30 tablet  3  . testosterone cypionate (DEPOTESTOTERONE CYPIONATE) 200 MG/ML injection Inject 1 mL (200 mg total) into the muscle every 14 (fourteen) days.  10 mL  1  . Vitamin D, Ergocalciferol, (DRISDOL) 50000 UNITS CAPS Take 50,000 Units by mouth.       No current facility-administered medications on file prior to visit.   Allergies  Allergen Reactions  . Pravastatin     Fatigue   Past Medical History  Diagnosis Date  . Hyperlipidemia   . GERD (gastroesophageal reflux disease)   . Unspecified vitamin D deficiency   . Other testicular hypofunction    Past Surgical History  Procedure Laterality Date  . Shoulder surgery  2011   History  Substance Use Topics  . Smoking status: Former Smoker    Quit date: 07/29/2006  . Smokeless tobacco: Never Used  . Alcohol Use: 9.0 oz/week    15 Cans of beer per week     Family History  Problem Relation Age of Onset  . Colon polyps Father   . Hyperlipidemia Father   . Colon cancer Paternal Uncle   . Lymphoma Mother   . Cancer Mother     lymphoma  . Depression Maternal Grandmother   . Cancer Maternal Grandmother 88    fatal colon  . Heart disease Paternal Grandfather     MAINTENANCE: Colonoscopy: 04/27/11 due 2018 EYE: 11/14 wnl Dentist: q 6 months  IMMUNIZATIONS: Tdap:2007 Pneumovax:n/a Zostavax:n/a Influenza:declines  Patient Care Team: Unk Pinto, MD as PCP - General (Internal Medicine) Milus Banister, MD as Attending Physician (Gastroenterology) Thornell Sartorius, MD as Consulting Physician (Otolaryngology) Willow Ora (Optometry) Marin Shutter, MD as Consulting Physician (Orthopedic Surgery) Ophelia Charter, MD as Consulting Physician (Neurosurgery)    Review of Systems  Constitutional: Positive for fatigue.  All other systems reviewed and are negative.   BP 144/90  Pulse 64  Temp(Src) 98.2 F (36.8 C) (Temporal)  Resp 18  Ht 6\' 1"  (1.854 m)  Wt 222 lb (100.699 kg)  BMI 29.30 kg/m2  Recheck 132/82    Objective:   Physical Exam  Nursing note and vitals reviewed. Constitutional: He is oriented to person, place, and time. He appears well-developed and well-nourished.  HENT:  Head: Normocephalic and atraumatic.  Right Ear: External ear normal.  Left Ear: External ear normal.  Nose: Nose normal.  Mouth/Throat: No oropharyngeal exudate.  Eyes: Conjunctivae and EOM are normal. Pupils are equal, round, and reactive to light. Right eye exhibits no discharge. Left eye exhibits no discharge. No scleral icterus.  Neck: Normal range of motion. Neck supple. No JVD present. No tracheal deviation present. No thyromegaly present.  Cardiovascular: Normal rate, regular rhythm, normal heart sounds and intact distal pulses.   Pulmonary/Chest: Effort normal and breath sounds normal.  Abdominal: Soft. Bowel sounds are normal. He  exhibits no distension and no mass. There is no tenderness. There is no rebound and no guarding.  Genitourinary:  DEF TO STOOL CARDS, CHECK 2016  Musculoskeletal: Normal range of motion. He exhibits no edema and no tenderness.  Lymphadenopathy:    He has no cervical adenopathy.  Neurological: He is alert and oriented to person, place, and time. He has normal reflexes. No cranial nerve deficit. He exhibits normal muscle tone. Coordination normal.  Skin: Skin is warm and dry. No rash noted. No erythema. No pallor.     Several flat 2-4 mm med to dark brown nevi  Psychiatric: He has a normal mood and affect. His behavior is normal. Judgment and thought content normal.      EKG NSCSPT WNL     Assessment & Plan:  1. CPE- Update screening labs/ History/ Immunizations/ Testing as needed. Advised healthy diet, QD exercise, increase H20 and continue RX/ Vitamins AD.  2. 3 month F/U for elevated BP w/o HTN, Cholesterol,Hypogonadism,  D. Deficient. Needs healthy diet, cardio QD and obtain healthy weight. Check Labs, Check BP if >130/80 call office  3. New FHX hemochromatosis with mother and needs screening  4. irreg Nevi- needs to schedule Removal

## 2013-04-04 LAB — BASIC METABOLIC PANEL WITH GFR
BUN: 12 mg/dL (ref 6–23)
CHLORIDE: 101 meq/L (ref 96–112)
CO2: 31 meq/L (ref 19–32)
Calcium: 10 mg/dL (ref 8.4–10.5)
Creat: 0.85 mg/dL (ref 0.50–1.35)
GFR, Est African American: >89 mL/min
GFR, Est Non African American: >89 mL/min
GLUCOSE: 98 mg/dL (ref 70–99)
Potassium: 4.3 mEq/L (ref 3.5–5.3)
SODIUM: 137 meq/L (ref 135–145)

## 2013-04-04 LAB — MAGNESIUM: MAGNESIUM: 1.8 mg/dL (ref 1.5–2.5)

## 2013-04-04 LAB — MICROALBUMIN / CREATININE URINE RATIO
CREATININE, URINE: 155.3 mg/dL
Microalb Creat Ratio: 3.9 mg/g (ref 0.0–30.0)
Microalb, Ur: 0.6 mg/dL (ref 0.00–1.89)

## 2013-04-04 LAB — LIPID PANEL
Cholesterol: 231 mg/dL — ABNORMAL HIGH (ref 0–200)
HDL: 82 mg/dL (ref >39)
LDL Cholesterol: 96 mg/dL (ref 0–99)
Total CHOL/HDL Ratio: 2.8 Ratio
Triglycerides: 264 mg/dL — ABNORMAL HIGH (ref <150)
VLDL: 53 mg/dL — ABNORMAL HIGH (ref 0–40)

## 2013-04-04 LAB — URINALYSIS, ROUTINE W REFLEX MICROSCOPIC
BILIRUBIN URINE: NEGATIVE
GLUCOSE, UA: NEGATIVE mg/dL
Hgb urine dipstick: NEGATIVE
Ketones, ur: NEGATIVE mg/dL
Leukocytes, UA: NEGATIVE
Nitrite: NEGATIVE
Protein, ur: NEGATIVE mg/dL
SPECIFIC GRAVITY, URINE: 1.019 (ref 1.005–1.030)
Urobilinogen, UA: 0.2 mg/dL (ref 0.0–1.0)
pH: 5 (ref 5.0–8.0)

## 2013-04-04 LAB — HEPATIC FUNCTION PANEL
ALK PHOS: 41 U/L (ref 39–117)
ALT: 39 U/L (ref 0–53)
AST: 28 U/L (ref 0–37)
Albumin: 4.8 g/dL (ref 3.5–5.2)
Bilirubin, Direct: 0.1 mg/dL (ref 0.0–0.3)
Indirect Bilirubin: 0.5 mg/dL (ref 0.2–1.2)
TOTAL PROTEIN: 7.2 g/dL (ref 6.0–8.3)
Total Bilirubin: 0.6 mg/dL (ref 0.2–1.2)

## 2013-04-04 LAB — FERRITIN: Ferritin: 268 ng/mL (ref 22–322)

## 2013-04-04 LAB — TSH: TSH: 2.407 u[IU]/mL (ref 0.350–4.500)

## 2013-04-04 LAB — INSULIN, FASTING: Insulin fasting, serum: 9 u[IU]/mL (ref 3–28)

## 2013-04-04 LAB — TESTOSTERONE: TESTOSTERONE: 158 ng/dL — AB (ref 300–890)

## 2013-04-04 LAB — PSA: PSA: 1.14 ng/mL (ref <=4.00)

## 2013-04-04 LAB — VITAMIN D 25 HYDROXY (VIT D DEFICIENCY, FRACTURES): Vit D, 25-Hydroxy: 52 ng/mL (ref 30–89)

## 2013-04-05 LAB — HEMOCHROMATOSIS DNA-PCR(C282Y,H63D)

## 2013-04-06 LAB — TB SKIN TEST
Induration: 0 mm
TB Skin Test: NEGATIVE

## 2013-04-09 ENCOUNTER — Encounter: Payer: Self-pay | Admitting: Emergency Medicine

## 2013-04-09 DIAGNOSIS — E782 Mixed hyperlipidemia: Secondary | ICD-10-CM | POA: Insufficient documentation

## 2013-04-23 ENCOUNTER — Encounter: Payer: Self-pay | Admitting: Emergency Medicine

## 2013-04-23 ENCOUNTER — Telehealth: Payer: Self-pay | Admitting: Internal Medicine

## 2013-04-23 ENCOUNTER — Ambulatory Visit: Payer: Self-pay

## 2013-04-23 ENCOUNTER — Other Ambulatory Visit: Payer: Self-pay | Admitting: Emergency Medicine

## 2013-04-23 MED ORDER — "SYRINGE 21G X 1"" 3 ML MISC": Status: DC

## 2013-04-23 NOTE — Telephone Encounter (Signed)
Patient would like to pick up Testosterone from our office and have wife to give injections.  Requesting an rx for the needles to [facility] at [phone].  Please notify patient if any issues with this request. Patient has MY CHART.  Thanks, Katrina

## 2013-05-07 ENCOUNTER — Observation Stay (HOSPITAL_COMMUNITY)
Admission: EM | Admit: 2013-05-07 | Discharge: 2013-05-08 | Disposition: A | Discharge status: Home / Self Care | Payer: Commercial Managed Care - PPO | Attending: Cardiovascular Disease | Admitting: Cardiovascular Disease

## 2013-05-07 ENCOUNTER — Encounter (HOSPITAL_COMMUNITY): Payer: Self-pay | Admitting: Emergency Medicine

## 2013-05-07 ENCOUNTER — Emergency Department (HOSPITAL_COMMUNITY): Payer: Commercial Managed Care - PPO

## 2013-05-07 DIAGNOSIS — I480 Paroxysmal atrial fibrillation: Secondary | ICD-10-CM | POA: Diagnosis present

## 2013-05-07 DIAGNOSIS — E559 Vitamin D deficiency, unspecified: Secondary | ICD-10-CM | POA: Insufficient documentation

## 2013-05-07 DIAGNOSIS — I4891 Unspecified atrial fibrillation: Principal | ICD-10-CM | POA: Insufficient documentation

## 2013-05-07 DIAGNOSIS — R0989 Other specified symptoms and signs involving the circulatory and respiratory systems: Secondary | ICD-10-CM | POA: Insufficient documentation

## 2013-05-07 DIAGNOSIS — Z79899 Other long term (current) drug therapy: Secondary | ICD-10-CM | POA: Insufficient documentation

## 2013-05-07 DIAGNOSIS — E782 Mixed hyperlipidemia: Secondary | ICD-10-CM | POA: Insufficient documentation

## 2013-05-07 DIAGNOSIS — K219 Gastro-esophageal reflux disease without esophagitis: Secondary | ICD-10-CM | POA: Insufficient documentation

## 2013-05-07 DIAGNOSIS — R0609 Other forms of dyspnea: Secondary | ICD-10-CM | POA: Insufficient documentation

## 2013-05-07 DIAGNOSIS — R7309 Other abnormal glucose: Secondary | ICD-10-CM | POA: Insufficient documentation

## 2013-05-07 DIAGNOSIS — Z87891 Personal history of nicotine dependence: Secondary | ICD-10-CM | POA: Insufficient documentation

## 2013-05-07 DIAGNOSIS — I369 Nonrheumatic tricuspid valve disorder, unspecified: Secondary | ICD-10-CM

## 2013-05-07 DIAGNOSIS — D696 Thrombocytopenia, unspecified: Secondary | ICD-10-CM | POA: Insufficient documentation

## 2013-05-07 DIAGNOSIS — E291 Testicular hypofunction: Secondary | ICD-10-CM | POA: Insufficient documentation

## 2013-05-07 HISTORY — DX: Unspecified atrial fibrillation: I48.91

## 2013-05-07 LAB — TROPONIN I: Troponin I: <0.3 ng/mL (ref <0.30)

## 2013-05-07 LAB — BASIC METABOLIC PANEL
BUN: 15 mg/dL (ref 6–23)
CHLORIDE: 98 meq/L (ref 96–112)
CO2: 23 mEq/L (ref 19–32)
Calcium: 9.8 mg/dL (ref 8.4–10.5)
Creatinine, Ser: 0.77 mg/dL (ref 0.50–1.35)
Glucose, Bld: 101 mg/dL — ABNORMAL HIGH (ref 70–99)
POTASSIUM: 4.3 meq/L (ref 3.7–5.3)
Sodium: 136 mEq/L — ABNORMAL LOW (ref 137–147)

## 2013-05-07 LAB — CBC WITH DIFFERENTIAL/PLATELET
Basophils Absolute: 0 10*3/uL (ref 0.0–0.1)
Basophils Relative: 0 % (ref 0–1)
EOS ABS: 0.1 10*3/uL (ref 0.0–0.7)
Eosinophils Relative: 1 % (ref 0–5)
HCT: 49.3 % (ref 39.0–52.0)
HEMOGLOBIN: 17.3 g/dL — AB (ref 13.0–17.0)
Lymphocytes Relative: 30 % (ref 12–46)
Lymphs Abs: 2.1 10*3/uL (ref 0.7–4.0)
MCH: 32.6 pg (ref 26.0–34.0)
MCHC: 35.1 g/dL (ref 30.0–36.0)
MCV: 93 fL (ref 78.0–100.0)
MONOS PCT: 13 % — AB (ref 3–12)
Monocytes Absolute: 0.9 10*3/uL (ref 0.1–1.0)
NEUTROS ABS: 3.9 10*3/uL (ref 1.7–7.7)
NEUTROS PCT: 56 % (ref 43–77)
PLATELETS: 146 10*3/uL — AB (ref 150–400)
RBC: 5.3 MIL/uL (ref 4.22–5.81)
RDW: 12.4 % (ref 11.5–15.5)
WBC: 6.9 10*3/uL (ref 4.0–10.5)

## 2013-05-07 LAB — TSH: TSH: 0.942 u[IU]/mL (ref 0.350–4.500)

## 2013-05-07 LAB — MAGNESIUM: Magnesium: 2.1 mg/dL (ref 1.5–2.5)

## 2013-05-07 MED ORDER — ATORVASTATIN CALCIUM 40 MG PO TABS
40.0000 mg | ORAL_TABLET | Freq: Every day | ORAL | Status: DC
  Administered 2013-05-07: 40 mg via ORAL
  Filled 2013-05-07 (×2): qty 1

## 2013-05-07 MED ORDER — VITAMIN D (ERGOCALCIFEROL) 1.25 MG (50000 UNIT) PO CAPS
50000.0000 [IU] | ORAL_CAPSULE | Freq: Every day | ORAL | Status: DC
  Administered 2013-05-07 – 2013-05-08 (×2): 50000 [IU] via ORAL
  Filled 2013-05-07 (×2): qty 1

## 2013-05-07 MED ORDER — PSYLLIUM 95 % PO PACK
1.0000 | PACK | Freq: Every day | ORAL | Status: DC
  Administered 2013-05-07 – 2013-05-08 (×2): 1 via ORAL
  Filled 2013-05-07 (×2): qty 1

## 2013-05-07 MED ORDER — DILTIAZEM LOAD VIA INFUSION
10.0000 mg | Freq: Once | INTRAVENOUS | Status: AC
  Administered 2013-05-07: 10 mg via INTRAVENOUS
  Filled 2013-05-07: qty 10

## 2013-05-07 MED ORDER — APIXABAN 5 MG PO TABS
5.0000 mg | ORAL_TABLET | Freq: Two times a day (BID) | ORAL | Status: DC
  Administered 2013-05-07: 5 mg via ORAL
  Filled 2013-05-07 (×3): qty 1

## 2013-05-07 MED ORDER — OMEGA-3-ACID ETHYL ESTERS 1 G PO CAPS
1.0000 g | ORAL_CAPSULE | Freq: Every day | ORAL | Status: DC
  Administered 2013-05-07 – 2013-05-08 (×2): 1 g via ORAL
  Filled 2013-05-07 (×2): qty 1

## 2013-05-07 MED ORDER — DILTIAZEM HCL 100 MG IV SOLR
10.0000 mg/h | INTRAVENOUS | Status: DC
  Administered 2013-05-07 (×2): 10 mg/h via INTRAVENOUS
  Filled 2013-05-07: qty 100

## 2013-05-07 MED ORDER — TESTOSTERONE CYPIONATE 200 MG/ML IM SOLN
200.0000 mg | INTRAMUSCULAR | Status: DC
  Filled 2013-05-07: qty 1

## 2013-05-07 MED ORDER — MAGNESIUM GLUCONATE 500 MG PO TABS
1000.0000 mg | ORAL_TABLET | Freq: Every day | ORAL | Status: DC
  Filled 2013-05-07 (×2): qty 2

## 2013-05-07 MED ORDER — SODIUM CHLORIDE 0.9 % IV SOLN
INTRAVENOUS | Status: DC
  Administered 2013-05-07 – 2013-05-08 (×3): via INTRAVENOUS

## 2013-05-07 NOTE — ED Provider Notes (Signed)
CSN: [id number]     Arrival date & time 05/07/13  0855 History   First MD Initiated Contact with Patient 05/07/13 [phone]     Chief Complaint  Patient presents with  . Palpitations     (Consider location/radiation/quality/duration/timing/severity/associated sxs/prior Treatment) HPI Comments: Patient presents to the ER for evaluation of dizziness, fluttering in his chest, shortness of breath. He awakened at 4 AM with these symptoms. He had been feeling fine when he went to bed the night before. He denies any chest pain. He feels more short of breath and dizzy when he ambulates.  Patient is a 43 y.o. male presenting with palpitations.  Palpitations Associated symptoms: shortness of breath     Past Medical History  Diagnosis Date  . Hyperlipidemia   . GERD (gastroesophageal reflux disease)   . Unspecified vitamin D deficiency   . Other testicular hypofunction   . Atrial fibrillation with rapid ventricular response    Past Surgical History  Procedure Laterality Date  . Shoulder surgery  2011   Family History  Problem Relation Age of Onset  . Colon polyps Father   . Hyperlipidemia Father   . Colon cancer Paternal Uncle   . Lymphoma Mother   . Cancer Mother     lymphoma  . Depression Maternal Grandmother   . Cancer Maternal Grandmother 48    fatal colon  . Heart disease Paternal Grandfather    History  Substance Use Topics  . Smoking status: Former Smoker    Quit date: 07/29/2006  . Smokeless tobacco: Never Used  . Alcohol Use: 9.0 oz/week    15 Cans of beer per week    Review of Systems  Respiratory: Positive for shortness of breath.   Cardiovascular: Positive for palpitations.  All other systems reviewed and are negative.     Allergies  Pravastatin  Home Medications   Prior to Admission medications   Medication Sig Start Date End Date Taking? Authorizing Provider  magnesium gluconate (MAGONATE) 500 MG tablet Take 1,000 mg by mouth daily.   Yes Historical  Provider, MD  Multiple Vitamin (MULTIVITAMIN) tablet Take 1 tablet by mouth daily.   Yes Historical Provider, MD  Omega-3 Fatty Acids (FISH OIL) 1000 MG CAPS Take 1,000 mg by mouth daily.   Yes Historical Provider, MD  psyllium (HYDROCIL/METAMUCIL) 95 % PACK Take 1 packet by mouth daily.   Yes Historical Provider, MD  rosuvastatin (CRESTOR) 20 MG tablet Take 1 tablet (20 mg total) by mouth daily. 11/23/12  Yes Melissa R Smith, PA-C  testosterone cypionate (DEPOTESTOTERONE CYPIONATE) 200 MG/ML injection Inject 1 mL (200 mg total) into the muscle every 14 (fourteen) days. 03/19/13  Yes Melissa R Smith, PA-C  Vitamin D, Ergocalciferol, (DRISDOL) 50000 UNITS CAPS Take 50,000 Units by mouth daily.    Yes Historical Provider, MD  VITAMIN E PO Take 1 capsule by mouth daily.   Yes Historical Provider, MD  Syringe/Needle, Disp, (SYRINGE 3CC/21GX1") 21G X 1" 3 ML MISC Use AD 04/23/13   Melissa R Smith, PA-C   BP 122/83  Pulse 96  Temp(Src) 98.6 F (37 C) (Oral)  Resp 15  Ht 6' (1.829 m)  Wt 212 lb (96.163 kg)  BMI 28.75 kg/m2  SpO2 97% Physical Exam  Constitutional: He is oriented to person, place, and time. He appears well-developed and well-nourished. No distress.  HENT:  Head: Normocephalic and atraumatic.  Right Ear: Hearing normal.  Left Ear: Hearing normal.  Nose: Nose normal.  Mouth/Throat: Oropharynx is clear and moist  and mucous membranes are normal.  Eyes: Conjunctivae and EOM are normal. Pupils are equal, round, and reactive to light.  Neck: Normal range of motion. Neck supple.  Cardiovascular: S1 normal and S2 normal.  An irregularly irregular rhythm present. Tachycardia present.  Exam reveals no gallop and no friction rub.   No murmur heard. Pulmonary/Chest: Effort normal and breath sounds normal. No respiratory distress. He exhibits no tenderness.  Abdominal: Soft. Normal appearance and bowel sounds are normal. There is no hepatosplenomegaly. There is no tenderness. There is no  rebound, no guarding, no tenderness at McBurney's point and negative Murphy's sign. No hernia.  Musculoskeletal: Normal range of motion.  Neurological: He is alert and oriented to person, place, and time. He has normal strength. No cranial nerve deficit or sensory deficit. Coordination normal. GCS eye subscore is 4. GCS verbal subscore is 5. GCS motor subscore is 6.  Skin: Skin is warm, dry and intact. No rash noted. No cyanosis.  Psychiatric: He has a normal mood and affect. His speech is normal and behavior is normal. Thought content normal.    ED Course  Procedures (including critical care time) Labs Review Labs Reviewed  CBC WITH DIFFERENTIAL - Abnormal; Notable for the following:    Hemoglobin 17.3 (*)    Platelets 146 (*)    Monocytes Relative 13 (*)    All other components within normal limits  BASIC METABOLIC PANEL - Abnormal; Notable for the following:    Sodium 136 (*)    Glucose, Bld 101 (*)    All other components within normal limits  TROPONIN I  TSH    Imaging Review Dg Chest Port 1 View  05/07/2013   CLINICAL DATA:  Shortness of breath.  EXAM: PORTABLE CHEST - 1 VIEW  COMPARISON:  02/28/2012  FINDINGS: Mild hyperinflation. Numerous leads and wires project over the chest. Midline trachea. Normal heart size and mediastinal contours. No pleural effusion or pneumothorax. Clear lungs.  IMPRESSION: No acute cardiopulmonary disease.   Electronically Signed   By: Abigail Miyamoto M.D.   On: 05/07/2013 10:02     EKG Interpretation   Date/Time:  Monday May 07 2013 09:09:20 EDT Ventricular Rate:  131 PR Interval:    QRS Duration: 82 QT Interval:  298 QTC Calculation: 440 R Axis:   60 Text Interpretation:  Atrial fibrillation with RVR No previous ECGs  available Confirmed by POLLINA  MD, CHRISTOPHER [phone]) on 05/07/2013  10:28:00 AM      MDM   Final diagnoses:  Atrial fibrillation    Patient presents to the ER with her palpitations, weakness, dizziness and mild  shortness of breath. He is found to be in atrial fibrillation with a rapid ventricular response. His initial workup has been unremarkable. He was a blistered IV Cardizem the Cardizem drip and has had improved heart rate and symptoms. Thus far, he has not converted to sinus rhythm. Cardiology consult for further treatment.   Orpah Greek, MD 05/07/13 [phone]

## 2013-05-07 NOTE — ED Notes (Signed)
Pt reports feelings of his heart "fluttering and racing" since 4am. Pt denies chest pain. Pt states he feels winded with ambulation. Irregular HR on monitor.

## 2013-05-07 NOTE — H&P (Signed)
Patient ID: Michael Kim MRN: [id number], DOB/AGE: [birth year]   Admit date: 05/07/2013   Primary Physician: Alesia Richards, MD Primary Cardiologist: New   Pt. Profile: Michael Kim is a 43 y.o. male with a history of HLD, GERD and testicular hypofunction who presetned to [facility] ED with palpitations and SOB since 4am and was found to be in atrial fibrillation with RVR. The patient has no past cardiac history and has experienced anything like this before. He was in his usual state of health, had a good nights sleep, and woke up to get ready for the day. He suddenly started to notice palpitations assocaited with shortness of breath that was made worse with exertion. He felt like he could not walk more than a few steps without feeling weak and having to stop. He also noticed right-sided upper extremity weakness that has now resolved. No chest pain, diaphoresis, lightheadedness dizziness, or visual disturbance, presyncope or syncope. He denies orthopnea, PND, lower extremity edema. He denies illicit drug use or tobacco use. He does drink regularly, but no increased usage or binge drinking episodes.  He was started on a dilt gtt in the ED with symtpmatic improvement.    Problem List  Past Medical History  Diagnosis Date  . Hyperlipidemia   . GERD (gastroesophageal reflux disease)   . Unspecified vitamin D deficiency   . Other testicular hypofunction   . Mixed hyperlipidemia 04/09/2013    Past Surgical History  Procedure Laterality Date  . Shoulder surgery  2011     Allergies  Allergies  Allergen Reactions  . Pravastatin     Fatigue    Home Medications  Prior to Admission medications   Medication Sig Start Date End Date Taking? Authorizing Provider  magnesium gluconate (MAGONATE) 500 MG tablet Take 1,000 mg by mouth daily.   Yes Historical Provider, MD  Multiple Vitamin (MULTIVITAMIN) tablet Take 1 tablet by mouth daily.   Yes Historical Provider, MD  Omega-3 Fatty Acids  (FISH OIL) 1000 MG CAPS Take 1,000 mg by mouth daily.   Yes Historical Provider, MD  psyllium (HYDROCIL/METAMUCIL) 95 % PACK Take 1 packet by mouth daily.   Yes Historical Provider, MD  rosuvastatin (CRESTOR) 20 MG tablet Take 1 tablet (20 mg total) by mouth daily. 11/23/12  Yes Melissa R Smith, PA-C  testosterone cypionate (DEPOTESTOTERONE CYPIONATE) 200 MG/ML injection Inject 1 mL (200 mg total) into the muscle every 14 (fourteen) days. 03/19/13  Yes Melissa R Smith, PA-C  Vitamin D, Ergocalciferol, (DRISDOL) 50000 UNITS CAPS Take 50,000 Units by mouth daily.    Yes Historical Provider, MD  VITAMIN E PO Take 1 capsule by mouth daily.   Yes Historical Provider, MD  Syringe/Needle, Disp, (SYRINGE 3CC/21GX1") 21G X 1" 3 ML MISC Use AD 04/23/13   Ardis Hughs, PA-C    Family History  Family History  Problem Relation Age of Onset  . Colon polyps Father   . Hyperlipidemia Father   . Colon cancer Paternal Uncle   . Lymphoma Mother   . Cancer Mother     lymphoma  . Depression Maternal Grandmother   . Cancer Maternal Grandmother 38    fatal colon  . Heart disease Paternal Grandfather     Social History  History   Social History  . Marital Status: Married    Spouse Name: N/A    Number of Children: N/A  . 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 Smoking status: Former Smoker    Quit date: 07/29/2006  . Smokeless tobacco: Never Used  . Alcohol Use: 9.0 oz/week    15 Cans of beer per week  . Drug Use: No  . Sexual Activity: Yes   Other Topics Concern  . Not on file   Social History Narrative  . No narrative on file     Review of Systems General:  No chills, fever, night sweats or weight changes.  Cardiovascular:  No chest pain, +dyspnea on exertion, edema, orthopnea, +palpitations, paroxysmal nocturnal dyspnea. Dermatological: No rash, lesions/masses Respiratory: No cough, +dyspnea Urologic: No  hematuria, dysuria Abdominal:   No nausea, vomiting, diarrhea, bright red blood per rectum, melena, or hematemesis Neurologic:  No visual changes, wkns, changes in mental status. All other systems reviewed and are otherwise negative except as noted above.  Physical Exam  Blood pressure 122/83, pulse 96, temperature 98.6 F (37 C), temperature source Oral, resp. rate 15, height 6' (1.829 m), weight 212 lb (96.163 kg), SpO2 97.00%.  General: Pleasant, NAD Psych: Normal affect. Neuro: Alert and oriented X 3. Moves all extremities spontaneously. HEENT: Normal  Neck: Supple without bruits or JVD. Lungs:  Resp regular and unlabored, CTA. Heart: RRR no s3, s4, or murmurs. Abdomen: Soft, non-tender, non-distended, BS + x 4.  Extremities: No clubbing, cyanosis or edema. DP/PT/Radials 2+ and equal bilaterally.  Labs   Recent Labs  05/07/13 0945  TROPONINI <0.30   Lab Results  Component Value Date   WBC 6.9 05/07/2013   HGB 17.3* 05/07/2013   HCT 49.3 05/07/2013   MCV 93.0 05/07/2013   PLT 146* 05/07/2013    Recent Labs Lab 05/07/13 0945  NA 136*  K 4.3  CL 98  CO2 23  BUN 15  CREATININE 0.77  CALCIUM 9.8  GLUCOSE 101*   Lab Results  Component Value Date   CHOL 231* 04/03/2013   HDL 82 04/03/2013   LDLCALC 96 04/03/2013   TRIG 264* 04/03/2013      Radiology/Studies  Dg Chest Port 1 View  05/07/2013   CLINICAL DATA:  Shortness of breath.  EXAM: PORTABLE CHEST - 1 VIEW  COMPARISON:  02/28/2012  FINDINGS: Mild hyperinflation. Numerous leads and wires project over the chest. Midline trachea. Normal heat size and mediastinal contours. No pleural effusion or pneumothorax. Clear lungs.  IMPRESSION: No acute cardiopulmonary disease.    ECG  Atrial fibrillation HR 131  ASSESSMENT AND PLAN Michael Kim is a 43 y.o. male with a history of HLD, GERD and testicular hypofunction who presetned to [facility] ED with palpitations and SOB since 4am and was found to be in atrial fibrillation with RVR.     Atrial fibrillation with RVR- symptomatic improvement with dilt gtt in ED. HR now controlled. -- Duration since 4 am this morning.  -- CHADS score of 0. He does not have CHF, HTN, Age <75 DM or previous cardiac disease or CVA. 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 Normal on 04/03/13   SignedPerry Mount, PA-C 05/07/2013, 1:55 PM  Pager [phone]  Attending Note:   The patient was seen and examined.  Agree with assessment and plan as noted above.  Changes made to the above note as needed.  Pt is doing well.  Basically asymptomatic at this point. He has eaten lunch today  1.  Atrial fib:  At this point,  we do not know how long he's been in AF.  Likely since 4 AM this am but we do not know.   His HR has slowed nicely on the dilt drip  Plan:  Continue dilt drip tonight.  Transition to PO Dilt tomorrow am. Start eliquis 5 BID tonight. Echo tomorrow AM  If he converts back to NSR, he can be DC'd on no meds and can follow up with me in the office in several weeks.  If he remains in Afib, we should schedule him for a TEE / CV on Thursday or Friday .  He will remain on Eliquis for 4 weeks following cardioversion.  I'll see him in the office in 3-4 weeks .   Thayer Headings, Brooke Bonito., MD, [facility] 05/07/2013, 3:12 PM

## 2013-05-07 NOTE — Progress Notes (Signed)
  Echocardiogram 2D Echocardiogram has been performed.  Basilia Jumbo 05/07/2013, 4:35 PM

## 2013-05-07 NOTE — Progress Notes (Signed)
At [place], telemetry called RN to say patient's HR was saying below 60 up to 70. BP normal. Cardizem drip was stopped. On call doctor notified, doctor stated to keep drip turned off and restart if heart rate got above 90. No new orders given. MD stated doctor in the morning would put orders in for PO cardizem. Will continue to monitor.

## 2013-05-08 LAB — TROPONIN I

## 2013-05-08 LAB — BASIC METABOLIC PANEL
BUN: 14 mg/dL (ref 6–23)
CALCIUM: 8.9 mg/dL (ref 8.4–10.5)
CO2: 25 mEq/L (ref 19–32)
CREATININE: 0.77 mg/dL (ref 0.50–1.35)
Chloride: 102 mEq/L (ref 96–112)
Glucose, Bld: 127 mg/dL — ABNORMAL HIGH (ref 70–99)
Potassium: 3.8 mEq/L (ref 3.7–5.3)
SODIUM: 137 meq/L (ref 137–147)

## 2013-05-08 LAB — CBC
HCT: 46.8 % (ref 39.0–52.0)
Hemoglobin: 15.9 g/dL (ref 13.0–17.0)
MCH: 31.9 pg (ref 26.0–34.0)
MCHC: 34 g/dL (ref 30.0–36.0)
MCV: 94 fL (ref 78.0–100.0)
PLATELETS: 142 10*3/uL — AB (ref 150–400)
RBC: 4.98 MIL/uL (ref 4.22–5.81)
RDW: 12.6 % (ref 11.5–15.5)
WBC: 7.3 10*3/uL (ref 4.0–10.5)

## 2013-05-08 MED ORDER — DILTIAZEM HCL 60 MG PO TABS: 60.0000 mg | ORAL_TABLET | Freq: Four times a day (QID) | ORAL | Status: DC

## 2013-05-08 NOTE — Plan of Care (Signed)
Problem: Phase I Progression Outcomes Goal: Heart rate or rhythm control medication Outcome: Completed/Met Date Met:  05/08/13 Patient's heart converted back into NSR

## 2013-05-08 NOTE — Progress Notes (Signed)
Patient's heart converted back into NSR around 0045. HR in the 60's-70's. Will continue to monitor.

## 2013-05-08 NOTE — Progress Notes (Signed)
UR Completed.  Michael Kim Michael Kim [phone] 05/08/2013  

## 2013-05-08 NOTE — Progress Notes (Signed)
    Subjective:  No complaints, no SOB, no CP. Symptomatic AFIB yesterday. Drinks ETOH in general. No decongestants. CXR normal. EF normal.  Snores  Objective:  Vital Signs in the last 24 hours: Temp:  [98.2 F (36.8 C)-98.6 F (37 C)] 98.2 F (36.8 C) (05/05 0424) Pulse Rate:  [63-112] 63 (05/05 0424) Resp:  [14-22] 20 (05/05 0424) BP: (118-141)/(64-91) 137/81 mmHg (05/05 0424) SpO2:  [95 %-100 %] 99 % (05/05 0424) Weight:  [212 lb (96.163 kg)-221 lb 6.4 oz (100.426 kg)] 221 lb 6.4 oz (100.426 kg) (05/05 5732)  Intake/Output from previous day: 05/04 0701 - 05/05 0700 In: 2146.3 [P.O.:480; I.V.:1666.3] Out: -    Physical Exam: General: Well developed, well nourished, in no acute distress. Head:  Normocephalic and atraumatic. Lungs: Clear to auscultation and percussion. Heart: Normal S1 and S2.  No murmur, rubs or gallops.  Abdomen: soft, non-tender, positive bowel sounds. Extremities: No clubbing or cyanosis. No edema. Neurologic: Alert and oriented x 3.    Lab Results:  Recent Labs  05/07/13 0945 05/08/13 0105  WBC 6.9 7.3  HGB 17.3* 15.9  PLT 146* 142*    Recent Labs  05/07/13 0945 05/08/13 0105  NA 136* 137  K 4.3 3.8  CL 98 102  CO2 23 25  GLUCOSE 101* 127*  BUN 15 14  CREATININE 0.77 0.77    Recent Labs  05/07/13 1949 05/08/13 0105  TROPONINI <0.30 <0.30   Imaging: Dg Chest Port 1 View  05/07/2013   CLINICAL DATA:  Shortness of breath.  EXAM: PORTABLE CHEST - 1 VIEW  COMPARISON:  02/28/2012  FINDINGS: Mild hyperinflation. Numerous leads and wires project over the chest. Midline trachea. Normal heart size and mediastinal contours. No pleural effusion or pneumothorax. Clear lungs.  IMPRESSION: No acute cardiopulmonary disease.   Electronically Signed   By: Abigail Miyamoto M.D.   On: 05/07/2013 10:02   Personally viewed.   Telemetry: Converted to NSR over night Personally viewed.  Cardiac Studies:  EF normal, LA size normal  Assessment/Plan:    Active Problems:   Atrial fibrillation with rapid ventricular response  1) DC home - follow up in 4 weeks or so with Dr. Acie Fredrickson. 2) Give diltiazem 60mg  PO Q6 hrs prn. Take if feels another episode, lay down. If no relief, seek medical attention.  3) Avoid excessive ETOH 4) decrease caffeine use 5) Snores -consider outpt sleep eval. 6) Weight loss. 7) CHADS-VASc-0. No anticoagulation.   TSH normal.    Candee Furbish 05/08/2013, 6:51 AM

## 2013-05-08 NOTE — Discharge Summary (Signed)
CARDIOLOGY DISCHARGE SUMMARY   Patient ID: Michael Kim MRN: [id number] DOB/AGE: [birth year] 43 y.o.  Admit date: 05/07/2013 Discharge date: 05/08/2013  PCP: Alesia Richards, MD Primary Cardiologist: Dr. Acie Fredrickson  Primary Discharge Diagnosis:  Atrial fibrillation with rapid ventricular response Secondary Discharge Diagnosis:  Borderline Obesity Possible obstructive sleep apnea  Procedures: 2-D echocardiogram  Hospital Course: Michael Kim is a 43 y.o. male with no history of CAD or arrhythmia. He had sudden onset of palpitations associated with shortness of breath and weakness. He came to the emergency room and was in atrial fibrillation with rapid ventricular response. He was started on IV Cardizem for rate control and admitted for further evaluation and treatment.  He responded well to IV diltiazem for rate control and will be on an oral formulation at discharge. A TSH was checked with results below, it was normal and no further thyroid functions were tested. A 2-D echocardiogram was performed results below. His heart muscle function was normal, with no wall motion abnormalities. He was noted to be a heavy snorer, and heme any a sleep study as an outpatient.  He spontaneously converted to sinus rhythm just before 1 AM. His heart rate remained stable, and the 60s-70s.  On 05/05, he was seen by Dr. Marlou Porch and all data were reviewed. His CHADs2VASc score is 0 so anticoagulation is not indicated. He was maintaining sinus rhythm. His other labs showed no significant abnormality except a mild thrombocytopenia.  He had mild hyperglycemia, and is encouraged to stick to a heart healthy diet. Dr. Marlou Porch and did not feel daily medication was indicated and felt that when necessary Cardizem would be adequate as an outpatient. No further inpatient workup is indicated and he is considered stable for discharge, to follow up as an outpatient.  Labs:   Lab Results  Component Value Date   WBC 7.3  05/08/2013   HGB 15.9 05/08/2013   HCT 46.8 05/08/2013   MCV 94.0 05/08/2013   PLT 142* 05/08/2013     Recent Labs Lab 05/08/13 0105  NA 137  K 3.8  CL 102  CO2 25  BUN 14  CREATININE 0.77  CALCIUM 8.9  GLUCOSE 127*    Recent Labs  05/07/13 0945 05/07/13 1949 05/08/13 0105  TROPONINI <0.30 <0.30 <0.30   Lab Results  Component Value Date   TSH 0.942 05/07/2013      Radiology: Dg Chest Port 1 View 05/07/2013   CLINICAL DATA:  Shortness of breath.  EXAM: PORTABLE CHEST - 1 VIEW  COMPARISON:  02/28/2012  FINDINGS: Mild hyperinflation. Numerous leads and wires project over the chest. Midline trachea. Normal heart size and mediastinal contours. No pleural effusion or pneumothorax. Clear lungs.  IMPRESSION: No acute cardiopulmonary disease.   Electronically Signed   By: Abigail Miyamoto M.D.   On: 05/07/2013 10:02   EKG: 05/07/2013 Atrial fibrillation, rapid ventricular response No acute ischemic changes Vent. rate 131 BPM PR interval * ms QRS duration 82 ms QT/QTc 298/440 ms P-R-T axes -1 60 35  Echo: 05/07/2013 Study Conclusions - Left ventricle: The cavity size was normal. Wall thickness was increased in a pattern of mild LVH. Systolic function was normal. The estimated ejection fraction was in the range of 60% to 65%. Wall motion was normal; there were no regional wall motion abnormalities. - Right atrium: The atrium was mildly dilated.   FOLLOW UP PLANS AND APPOINTMENTS Allergies  Allergen Reactions  . Pravastatin     Fatigue     Medication  List         diltiazem 60 MG tablet  Commonly known as:  CARDIZEM  Take 1 tablet (60 mg total) by mouth 4 (four) times daily. When necessary for palpitations. Seek medical help if they do not resolve.     Fish Oil 1000 MG Caps  Take 1,000 mg by mouth daily.     magnesium gluconate 500 MG tablet  Commonly known as:  MAGONATE  Take 1,000 mg by mouth daily.     multivitamin tablet  Take 1 tablet by mouth daily.     psyllium  95 % Pack  Commonly known as:  HYDROCIL/METAMUCIL  Take 1 packet by mouth daily.     rosuvastatin 20 MG tablet  Commonly known as:  CRESTOR  Take 1 tablet (20 mg total) by mouth daily.     SYRINGE 3CC/21GX1" 21G X 1" 3 ML Misc  Use AD     testosterone cypionate 200 MG/ML injection  Commonly known as:  DEPOTESTOTERONE CYPIONATE  Inject 1 mL (200 mg total) into the muscle every 14 (fourteen) days.     Vitamin D (Ergocalciferol) 50000 UNITS Caps capsule  Commonly known as:  DRISDOL  Take 50,000 Units by mouth daily.     VITAMIN E PO  Take 1 capsule by mouth daily.        Discharge Orders   Future Appointments Provider Department Dept Phone   06/04/2013 9:45 AM Thayer Headings, MD [place] [phone]   04/11/2014 2:00 PM Christean Leaf  [facility] [phone]   Future Orders Complete By Expires   Diet - low sodium heart healthy  As directed    Increase activity slowly  As directed      Follow-up Information   Follow up with Darden Amber., MD On 06/04/2013. (at 9:45 am)    Specialty:  Cardiology   Contact information:   [place] [address] [place] [address] [phone]       BRING ALL MEDICATIONS WITH YOU TO FOLLOW UP APPOINTMENTS  Time spent with patient to include physician time: 33 min Signed: Lonn Georgia, PA-C 05/08/2013, 11:05 AM Co-Sign MD

## 2013-05-08 NOTE — Discharge Instructions (Signed)
Take the Cardizem if you feel another episode, and lay down. If no relief, seek medical attention.   No more than 1 ounce of alcohol daily. Limit caffeine. Avoid over-the-counter medications containing pseudoephedrine or phenylephrine.

## 2013-05-08 NOTE — Discharge Summary (Signed)
Personally seen and examined, agree with above. Paroxysmal atrial fibrillation. Decrease overall alcohol intake. Diltiazem 60 mg when necessary. No anticoagulation.

## 2013-06-04 ENCOUNTER — Encounter: Payer: Self-pay | Admitting: Cardiovascular Disease

## 2013-06-04 ENCOUNTER — Ambulatory Visit (INDEPENDENT_AMBULATORY_CARE_PROVIDER_SITE_OTHER): Payer: Commercial Managed Care - PPO | Admitting: Cardiovascular Disease

## 2013-06-04 VITALS — BP 120/85 | HR 65 | Ht 72.0 in | Wt 213.0 lb

## 2013-06-04 DIAGNOSIS — I4891 Unspecified atrial fibrillation: Secondary | ICD-10-CM

## 2013-06-04 MED ORDER — ASPIRIN EC 81 MG PO TBEC: 81.0000 mg | DELAYED_RELEASE_TABLET | Freq: Every day | ORAL | Status: DC

## 2013-06-04 MED ORDER — DILTIAZEM HCL 60 MG PO TABS: 60.0000 mg | ORAL_TABLET | Freq: Four times a day (QID) | ORAL | Status: DC | PRN

## 2013-06-04 NOTE — Patient Instructions (Signed)
Your physician recommends that you continue on your current medications as directed. Please refer to the Current Medication list given to you today.  Your physician wants you to follow-up in: 1 year with Dr. Nahser.  You will receive a reminder letter in the mail two months in advance. If you don't receive a letter, please call our office to schedule the follow-up appointment.  

## 2013-06-04 NOTE — Progress Notes (Signed)
Michael Kim Date of Birth  September 14, 1970       [facility]    [employer] [address], [place], [place]  [address]   [place], [place]  [address] [place]   Fax  [phone]     Fax [phone]  Problem List: 1. Atrial fibrillation 2. Possible obstructive sleep apnea 3.  History of Present Illness:  Michael Kim is a 43 yo with hx of PAF.  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  I met him at the hospital with rapid AF.  He converted spontaneously.  Has not had any significant arrhythmias.      Current Outpatient Prescriptions on File Prior to Visit  Medication Sig Dispense Refill  . magnesium gluconate (MAGONATE) 500 MG tablet Take 1,000 mg by mouth daily.      . Multiple Vitamin (MULTIVITAMIN) tablet Take 1 tablet by mouth daily.      . Omega-3 Fatty Acids (FISH OIL) 1000 MG CAPS Take 1,000 mg by mouth daily.      . psyllium (HYDROCIL/METAMUCIL) 95 % PACK Take 1 packet by mouth daily.      . rosuvastatin (CRESTOR) 20 MG tablet Take 1 tablet (20 mg total) by mouth daily.  30 tablet  3  . Syringe/Needle, Disp, (SYRINGE 3CC/21GX1") 21G X 1" 3 ML MISC Use AD  100 each  0  . testosterone cypionate (DEPOTESTOTERONE CYPIONATE) 200 MG/ML injection Inject 1 mL (200 mg total) into the muscle every 14 (fourteen) days.  10 mL  1  . Vitamin D, Ergocalciferol, (DRISDOL) 50000 UNITS CAPS Take 50,000 Units by mouth daily.       Marland Kitchen VITAMIN E PO Take 1 capsule by mouth daily.       No current facility-administered medications on file prior to visit.    Allergies  Allergen Reactions  . Pravastatin     Fatigue    Past Medical History  Diagnosis Date  . Hyperlipidemia   . GERD (gastroesophageal reflux disease)   . Unspecified vitamin D deficiency   . Other testicular hypofunction   . Atrial fibrillation with rapid ventricular response     Past Surgical History  Procedure Laterality Date  . Shoulder surgery  2011    History    Smoking status  . Former Smoker  . Quit date: 07/29/2006  Smokeless tobacco  . Never Used    History  Alcohol Use  . 9.0 oz/week  . 15 Cans of beer per week    Family History  Problem Relation Age of Onset  . Colon polyps Father   . Hyperlipidemia Father   . Colon cancer Paternal Uncle   . Lymphoma Mother   . Cancer Mother     lymphoma  . Depression Maternal Grandmother   . Cancer Maternal Grandmother 41    fatal colon  . Heart disease Paternal Grandfather     Reviw of Systems:  Reviewed in the HPI.  All other systems are negative.  Physical Exam: Blood pressure 120/85, pulse 65, height 6' (1.829 m), weight 213 lb (96.616 kg). Wt Readings from Last 3 Encounters:  06/04/13 213 lb (96.616 kg)  05/08/13 221 lb 6.4 oz (100.426 kg)  04/03/13 222 lb (100.699 kg)     General: Well developed, well nourished, in no acute distress.  Head: Normocephalic, atraumatic, sclera non-icteric, mucus membranes are moist,   Neck: Supple. Carotids are 2 + without bruits. No JVD  Lungs: Clear   Heart: RR, normal s1s2  Abdomen: Soft, non-tender, non-distended with normal bowel sounds.  Msk:  Strength and tone are normal   Extremities: No clubbing or cyanosis. No edema.  Distal pedal pulses are 2+ and equal    Neuro: CN II - XII intact.  Alert and oriented X 3.   Psych:  Normal   ECG:   Assessment / Plan:

## 2013-06-04 NOTE — Assessment & Plan Note (Signed)
Revel has done well.  He has been taking his BP and hr regularly.   Will continue meds. Add ASA 81 mg a day  I'll see him in 1 year for office visit.-- sooner if neede.

## 2013-06-04 NOTE — Assessment & Plan Note (Signed)
>>  ASSESSMENT AND PLAN FOR ATRIAL FIBRILLATION WITH RAPID VENTRICULAR RESPONSE (HCC) WRITTEN ON 06/04/2013 11:05 AM BY NAHSER, Deloris Ping, MD  Michael Kim has done well.  He has been taking his BP and hr regularly.   Will continue meds. Add ASA 81 mg a day  I'll see him in 1 year for office visit.-- sooner if neede.

## 2013-07-20 IMAGING — CR DG CHEST 2V
2 series · 2 of 2 positions shown · non-contrast
Comparison: 07/29/2011

CLINICAL DATA: Cough, chest pain.

CHEST - 2 VIEW

[view not recorded (1 of 2)]
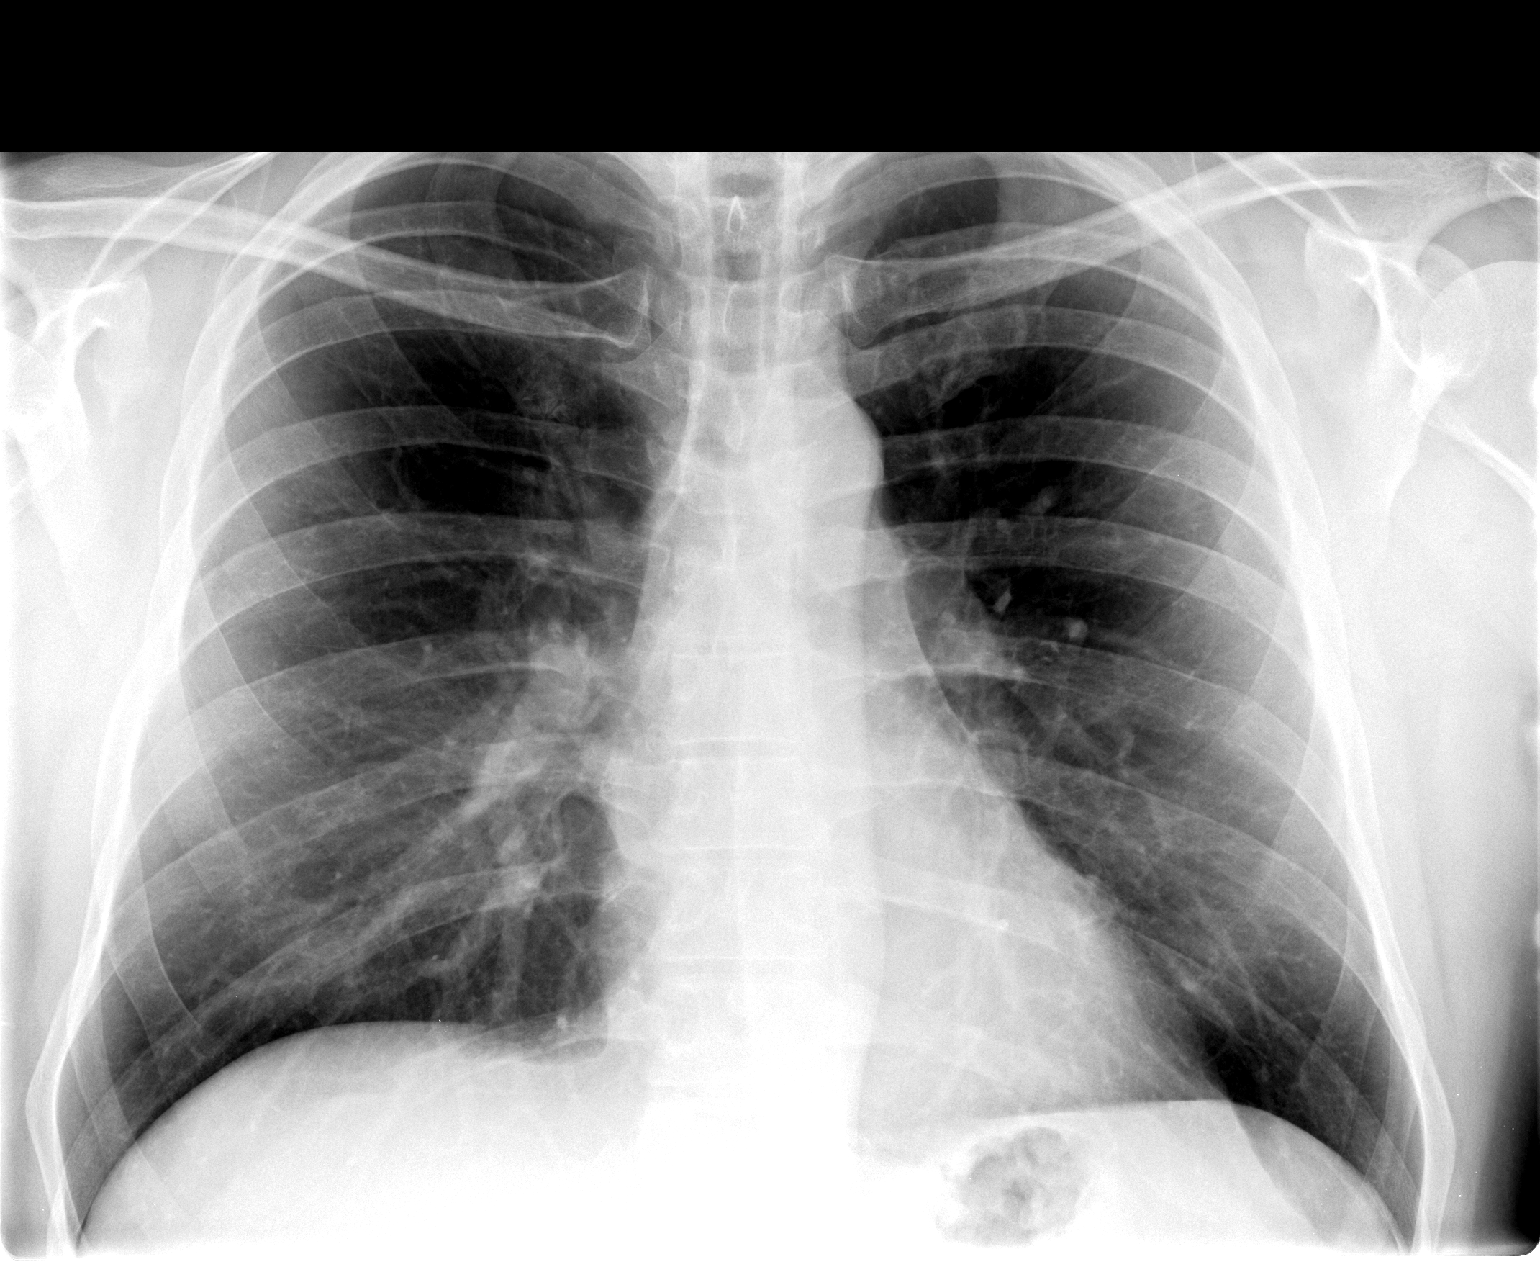

[view not recorded (2 of 2)]
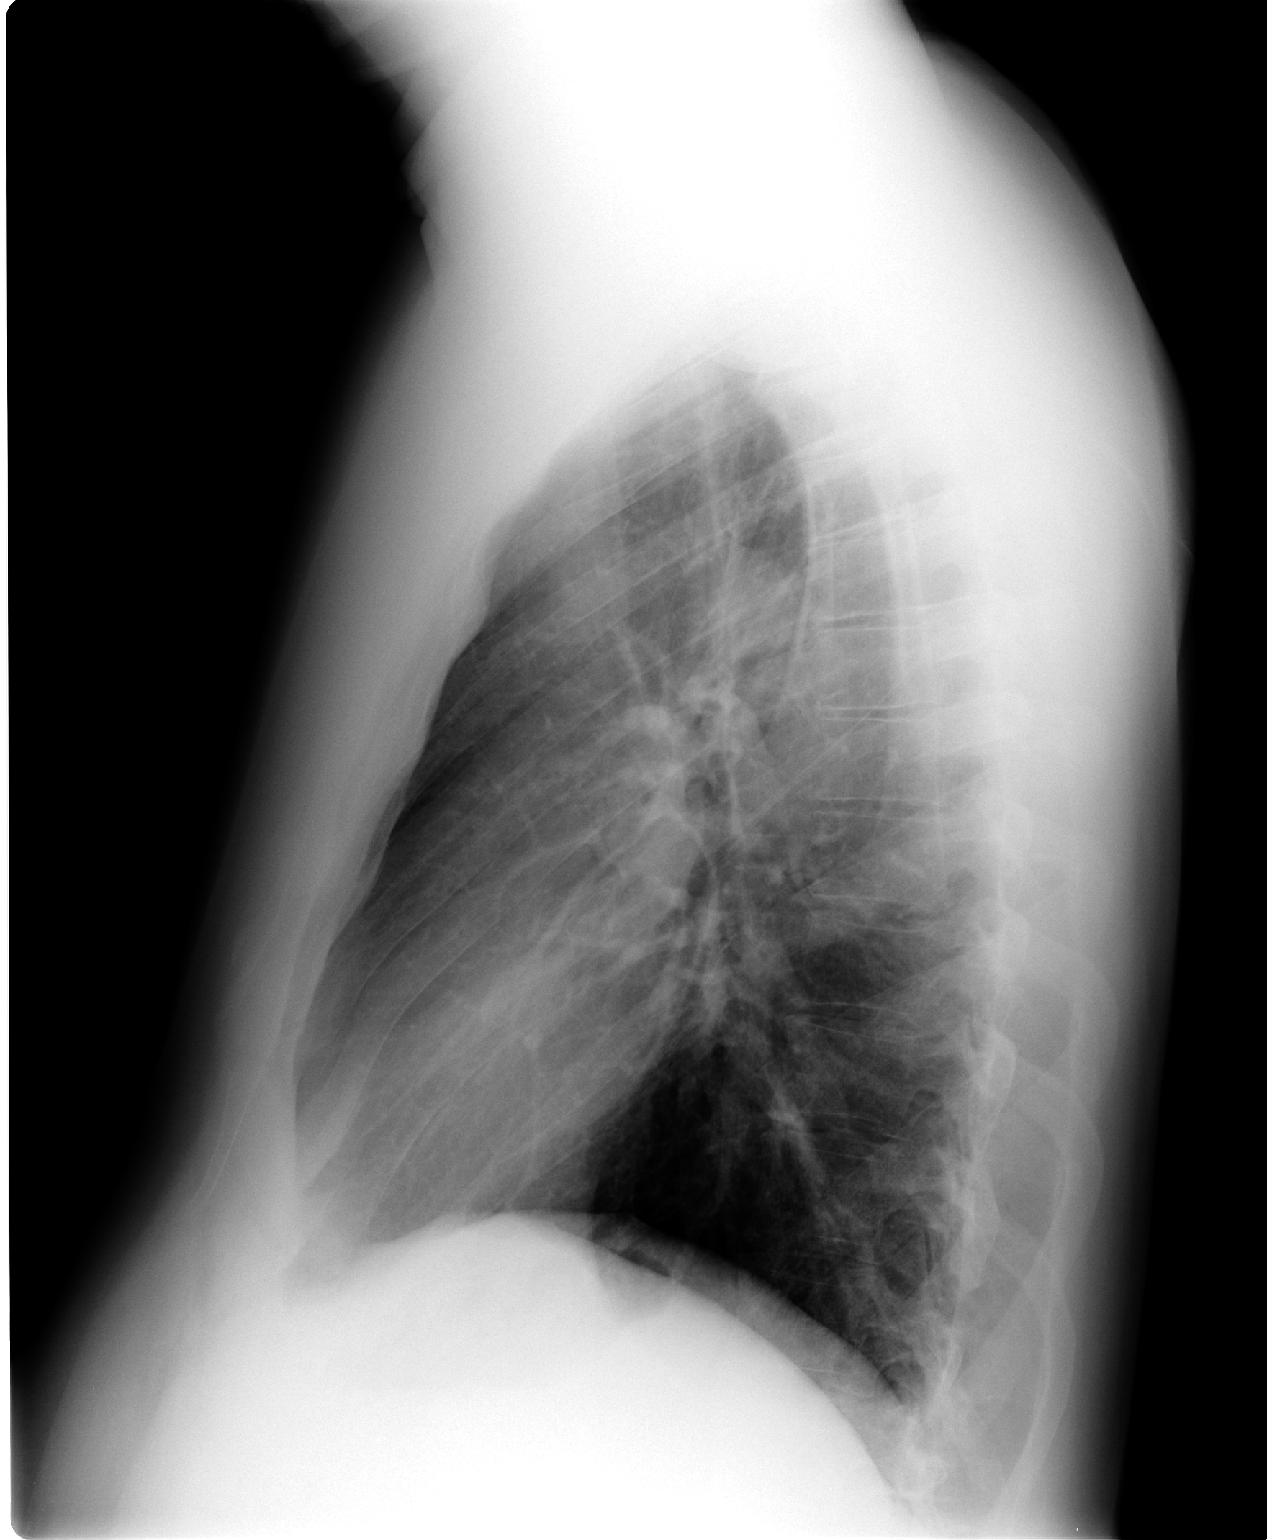

[2 of 2 positions shown; findings below may reference images not displayed]

FINDINGS: Heart and mediastinal contours are within normal limits.
No focal opacities or effusions.  No acute bony abnormality.
IMPRESSION: No active cardiopulmonary disease.

## 2013-09-11 ENCOUNTER — Other Ambulatory Visit: Payer: Self-pay | Admitting: *Deleted

## 2013-09-11 MED ORDER — ROSUVASTATIN CALCIUM 20 MG PO TABS: 20.0000 mg | ORAL_TABLET | Freq: Every day | ORAL | Status: DC

## 2013-10-12 ENCOUNTER — Other Ambulatory Visit: Payer: Self-pay | Admitting: Internal Medicine

## 2013-10-17 ENCOUNTER — Other Ambulatory Visit: Payer: Self-pay | Admitting: *Deleted

## 2013-10-17 MED ORDER — TESTOSTERONE CYPIONATE 200 MG/ML IM SOLN: 200.0000 mg | INTRAMUSCULAR | Status: DC

## 2013-10-23 ENCOUNTER — Encounter: Payer: Self-pay | Admitting: Internal Medicine

## 2013-10-23 ENCOUNTER — Ambulatory Visit (INDEPENDENT_AMBULATORY_CARE_PROVIDER_SITE_OTHER): Payer: Commercial Managed Care - PPO | Admitting: Internal Medicine

## 2013-10-23 VITALS — BP 120/78 | HR 68 | Temp 98.1°F | Resp 16 | Ht 73.0 in | Wt 220.6 lb

## 2013-10-23 DIAGNOSIS — I10 Essential (primary) hypertension: Secondary | ICD-10-CM | POA: Insufficient documentation

## 2013-10-23 DIAGNOSIS — R030 Elevated blood-pressure reading, without diagnosis of hypertension: Secondary | ICD-10-CM

## 2013-10-23 DIAGNOSIS — E349 Endocrine disorder, unspecified: Secondary | ICD-10-CM

## 2013-10-23 DIAGNOSIS — E782 Mixed hyperlipidemia: Secondary | ICD-10-CM

## 2013-10-23 DIAGNOSIS — IMO0001 Reserved for inherently not codable concepts without codable children: Secondary | ICD-10-CM

## 2013-10-23 DIAGNOSIS — E559 Vitamin D deficiency, unspecified: Secondary | ICD-10-CM

## 2013-10-23 DIAGNOSIS — R7309 Other abnormal glucose: Secondary | ICD-10-CM | POA: Insufficient documentation

## 2013-10-23 DIAGNOSIS — Z79899 Other long term (current) drug therapy: Secondary | ICD-10-CM

## 2013-10-23 NOTE — Progress Notes (Signed)
Patient ID: Michael Kim, male   DOB: 07-29-1970, 42 y.o.   MRN: [id number]   This very nice 43 y.o.MWM presents for 3 month follow up with Hypertension, Hyperlipidemia, Pre-Diabetes and Vitamin D Deficiency.    Patient is monitored for HTN with hx/o elevated BP 144/90 in Mar 2015 at his CPE. & hence he's being monitored expectantly. Today's BP was 120/78 mmHg. Patient has hx/o pAfib in May - hospitalized overnight and spontaneous conversion . 2D Echo was normal. He does report occasional pounding ? regular brief palpitations. Patient has had no complaints of any cardiac type chest pain, dyspnea/orthopnea/PND, dizziness, claudication, or dependent edema.   Hyperlipidemia is controlled with diet & meds. Patient denies myalgias or other med SE's. Last Lipids were at goal Total Chol  231*; HDL 82; LDL 96 with elevated Trig 264 on 04/03/2013.   Also, the patient is screened for  PreDiabetes and has had no symptoms of reactive hypoglycemia, diabetic polys, paresthesias or visual blurring.  Last A1c was  5.6% on 04/03/2013.    Patient has Testosterone Deficiency and is on replacement therapy with improved sense of well- being. Further, the patient also has history of Vitamin D Deficiency (16 in 2009) and supplements vitamin D without any suspected side-effects. Last vitamin D was  52 on 04/03/2013.   Medication List   diltiazem 60 MG tablet  Commonly known as:  CARDIZEM  Take 1 tablet (60 mg total) by mouth 4 (four) times daily as needed.     Fish Oil 1000 MG Caps  Take 1,000 mg by mouth daily.     meloxicam 15 MG tablet  Commonly known as:  MOBIC     multivitamin tablet  Take 1 tablet by mouth daily.     psyllium 95 % Pack  Commonly known as:  HYDROCIL/METAMUCIL  Take 1 packet by mouth daily.     rosuvastatin 20 MG tablet  Commonly known as:  CRESTOR  Take 1 tablet (20 mg total) by mouth daily.     SYRINGE 3CC/21GX1" 21G X 1" 3 ML Misc  Use AD     testosterone cypionate 200 MG/ML injection   Commonly known as:  DEPOTESTOTERONE CYPIONATE  Inject 1 mL (200 mg total) into the muscle every 14 (fourteen) days.     Vitamin D (Ergocalciferol) 50000 UNITS Caps capsule  Commonly known as:  DRISDOL  Take 50,000 Units by mouth daily.     Allergies  Allergen Reactions  . Pravastatin     Fatigue   PMHx:   Past Medical History  Diagnosis Date  . Hyperlipidemia   . GERD (gastroesophageal reflux disease)   . Unspecified vitamin D deficiency   . Other testicular hypofunction   . Atrial fibrillation with rapid ventricular response    Immunization History  Administered Date(s) Administered  . PPD Test 04/03/2013  . Td 11/08/2005   Past Surgical History  Procedure Laterality Date  . Shoulder surgery  2011   FHx:    Reviewed / unchanged  SHx:    Reviewed / unchanged  Systems Review:  Constitutional: Denies fever, chills, wt changes, headaches, insomnia, fatigue, night sweats, change in appetite. Eyes: Denies redness, blurred vision, diplopia, discharge, itchy, watery eyes.  ENT: Denies discharge, congestion, post nasal drip, epistaxis, sore throat, earache, hearing loss, dental pain, tinnitus, vertigo, sinus pain, snoring.  CV: Denies chest pain, syncope, dyspnea, diaphoresis, orthopnea, PND, claudication or edema. Respiratory: denies cough, dyspnea, DOE, pleurisy, hoarseness, laryngitis, wheezing.  Gastrointestinal: Denies dysphagia, odynophagia, heartburn, reflux, water  brash, abdominal pain or cramps, nausea, vomiting, bloating, diarrhea, constipation, hematemesis, melena, hematochezia  or hemorrhoids. Genitourinary: Denies dysuria, frequency, urgency, nocturia, hesitancy, discharge, hematuria or flank pain. Musculoskeletal: Denies arthralgias, myalgias, stiffness, jt. swelling, pain, limping or strain/sprain.  Skin: Denies pruritus, rash, hives, warts, acne, eczema or change in skin lesion(s). Neuro: No weakness, tremor, incoordination, spasms, paresthesia or  pain. Psychiatric: Denies confusion, memory loss or sensory loss. Endo: Denies change in weight, skin or hair change.  Heme/Lymph: No excessive bleeding, bruising or enlarged lymph nodes.  Exam:  BP 120/78  Pulse 68  Temp(Src) 98.1 F (36.7 C) (Temporal)  Resp 16  Ht 6\' 1"  (1.854 m)  Wt 220 lb 9.6 oz (100.064 kg)  BMI 29.11 kg/m2  Appears well nourished and in no distress. Eyes: PERRLA, EOMs, conjunctiva no swelling or erythema. Sinuses: No frontal/maxillary tenderness ENT/Mouth: EAC's clear, TM's nl w/o erythema, bulging. Nares clear w/o erythema, swelling, exudates. Oropharynx clear without erythema or exudates. Oral hygiene is good. Tongue normal, non obstructing. Hearing intact.  Neck: Supple. Thyroid nl. Car 2+/2+ without bruits, nodes or JVD. Chest: Respirations nl with BS clear & equal w/o rales, rhonchi, wheezing or stridor.  Cor: Heart sounds normal w/ regular rate and rhythm without sig. murmurs, gallops, clicks, or rubs. Peripheral pulses normal and equal  without edema.  Abdomen: Soft & bowel sounds normal. Non-tender w/o guarding, rebound, hernias, masses, or organomegaly.  Lymphatics: Unremarkable.  Musculoskeletal: Full ROM all peripheral extremities, joint stability, 5/5 strength, and normal gait.  Skin: Warm, dry without exposed rashes, lesions or ecchymosis apparent.  Neuro: Cranial nerves intact, reflexes equal bilaterally. Sensory-motor testing grossly intact. Tendon reflexes grossly intact.  Pysch: Alert & oriented x 3.  Insight and judgement nl & appropriate. No ideations.  Assessment and Plan:  1. Hypertension - Continue monitor blood pressure at home. Continue diet/meds same.  2. Hyperlipidemia - Continue diet/meds, exercise,& lifestyle modifications. Continue monitor periodic cholesterol/liver & renal functions   3. Pre-Diabetes - Continue diet, exercise, lifestyle modifications. Monitor appropriate labs.  4. Vitamin D Deficiency - Continue  supplementation.  5. pAfib - discussed consider taking his Diltiazem on a regular schedule.  6. Testosterone Deficiency - monitor levels.   Recommended regular exercise, BP monitoring, weight control, and discussed med and SE's. Recommended labs to assess and monitor clinical status. Further disposition pending results of labs.

## 2013-10-23 NOTE — Patient Instructions (Signed)

## 2013-10-24 LAB — HEPATIC FUNCTION PANEL
ALBUMIN: 4.6 g/dL (ref 3.5–5.2)
ALT: 38 U/L (ref 0–53)
AST: 27 U/L (ref 0–37)
Alkaline Phosphatase: 37 U/L — ABNORMAL LOW (ref 39–117)
BILIRUBIN INDIRECT: 0.6 mg/dL (ref 0.2–1.2)
Bilirubin, Direct: 0.2 mg/dL (ref 0.0–0.3)
TOTAL PROTEIN: 6.9 g/dL (ref 6.0–8.3)
Total Bilirubin: 0.8 mg/dL (ref 0.2–1.2)

## 2013-10-24 LAB — CBC WITH DIFFERENTIAL/PLATELET
BASOS ABS: 0 10*3/uL (ref 0.0–0.1)
Basophils Relative: 0 % (ref 0–1)
Eosinophils Absolute: 0.2 10*3/uL (ref 0.0–0.7)
Eosinophils Relative: 3 % (ref 0–5)
HCT: 47.8 % (ref 39.0–52.0)
Hemoglobin: 16.2 g/dL (ref 13.0–17.0)
LYMPHS PCT: 35 % (ref 12–46)
Lymphs Abs: 2 10*3/uL (ref 0.7–4.0)
MCH: 31.8 pg (ref 26.0–34.0)
MCHC: 33.9 g/dL (ref 30.0–36.0)
MCV: 93.9 fL (ref 78.0–100.0)
MONO ABS: 0.6 10*3/uL (ref 0.1–1.0)
Monocytes Relative: 10 % (ref 3–12)
NEUTROS ABS: 3 10*3/uL (ref 1.7–7.7)
Neutrophils Relative %: 52 % (ref 43–77)
PLATELETS: 150 10*3/uL (ref 150–400)
RBC: 5.09 MIL/uL (ref 4.22–5.81)
RDW: 13.6 % (ref 11.5–15.5)
WBC: 5.7 10*3/uL (ref 4.0–10.5)

## 2013-10-24 LAB — TSH: TSH: 0.749 u[IU]/mL (ref 0.350–4.500)

## 2013-10-24 LAB — TESTOSTERONE: TESTOSTERONE: 661 ng/dL (ref 300–890)

## 2013-10-24 LAB — LIPID PANEL
CHOLESTEROL: 185 mg/dL (ref 0–200)
HDL: 84 mg/dL (ref >39)
LDL Cholesterol: 83 mg/dL (ref 0–99)
Total CHOL/HDL Ratio: 2.2 Ratio
Triglycerides: 88 mg/dL (ref <150)
VLDL: 18 mg/dL (ref 0–40)

## 2013-10-24 LAB — HEMOGLOBIN A1C
Hgb A1c MFr Bld: 5.7 % — ABNORMAL HIGH (ref <5.7)
MEAN PLASMA GLUCOSE: 117 mg/dL — AB (ref <117)

## 2013-10-24 LAB — BASIC METABOLIC PANEL WITH GFR
BUN: 16 mg/dL (ref 6–23)
CO2: 26 mEq/L (ref 19–32)
CREATININE: 0.7 mg/dL (ref 0.50–1.35)
Calcium: 9.4 mg/dL (ref 8.4–10.5)
Chloride: 101 mEq/L (ref 96–112)
Glucose, Bld: 88 mg/dL (ref 70–99)
POTASSIUM: 4.5 meq/L (ref 3.5–5.3)
Sodium: 138 mEq/L (ref 135–145)

## 2013-10-24 LAB — INSULIN, FASTING: Insulin fasting, serum: 3.1 u[IU]/mL (ref 2.0–19.6)

## 2013-10-24 LAB — VITAMIN D 25 HYDROXY (VIT D DEFICIENCY, FRACTURES): Vit D, 25-Hydroxy: 61 ng/mL (ref 30–89)

## 2013-10-24 LAB — MAGNESIUM: Magnesium: 1.9 mg/dL (ref 1.5–2.5)

## 2014-01-22 ENCOUNTER — Other Ambulatory Visit: Payer: Self-pay | Admitting: Internal Medicine

## 2014-01-24 ENCOUNTER — Ambulatory Visit (INDEPENDENT_AMBULATORY_CARE_PROVIDER_SITE_OTHER): Payer: Commercial Managed Care - PPO | Admitting: Physician Assistant

## 2014-01-24 ENCOUNTER — Encounter: Payer: Self-pay | Admitting: Physician Assistant

## 2014-01-24 VITALS — BP 132/88 | HR 80 | Temp 98.2°F | Resp 18 | Ht 73.0 in

## 2014-01-24 DIAGNOSIS — R030 Elevated blood-pressure reading, without diagnosis of hypertension: Secondary | ICD-10-CM

## 2014-01-24 DIAGNOSIS — Z79899 Other long term (current) drug therapy: Secondary | ICD-10-CM

## 2014-01-24 DIAGNOSIS — R7309 Other abnormal glucose: Secondary | ICD-10-CM

## 2014-01-24 DIAGNOSIS — E782 Mixed hyperlipidemia: Secondary | ICD-10-CM

## 2014-01-24 DIAGNOSIS — IMO0001 Reserved for inherently not codable concepts without codable children: Secondary | ICD-10-CM

## 2014-01-24 DIAGNOSIS — E349 Endocrine disorder, unspecified: Secondary | ICD-10-CM

## 2014-01-24 DIAGNOSIS — E559 Vitamin D deficiency, unspecified: Secondary | ICD-10-CM

## 2014-01-24 DIAGNOSIS — R7303 Prediabetes: Secondary | ICD-10-CM | POA: Insufficient documentation

## 2014-01-24 NOTE — Progress Notes (Signed)
HPI A Caucasian 44 y.o. male presents for 3 month follow up with hypertension, hyperlipidemia, prediabetes, testosterone deficiency and vitamin D.  Last visit was 10/23/13.  Patient wants to see if he can get off of Crestor.  Patient states he was drinking grapefruit juice and stopped drinking it since May 2015.   He is currently on testosterone replacement due to deficiency and states no side effects from testosterone and feels great.  His blood pressure has been controlled at home, today their BP is BP: 132/88 mmHg.  He currently takes Cardizem when he feels palpitations only.  He has only taken 6 tablets since being in hospital in May for paroxsymal atrial fibrillation with spontaneous conversion.  He admits to not taking Aspirin every day.  He does not workout, due to injury with stress fracture in right foot.  Currently being treated by Ortho and wearing brace.  He denies chest pain, shortness of breath, dizziness.   He is on cholesterol medication (Crestor 20mg  and Fish Oil) and denies myalgias. His cholesterol is at goal. The cholesterol last visit was:   Lab Results  Component Value Date   CHOL 185 10/23/2013   HDL 84 10/23/2013   LDLCALC 83 10/23/2013   TRIG 88 10/23/2013   CHOLHDL 2.2 10/23/2013   He has not been working on diet and exercise for prediabetes, and denies increased appetite, polydipsia and polyuria. Has had prediabetes since last visit on 10/23/13.  Last A1C in the office was:  Lab Results  Component Value Date   HGBA1C 5.7* 10/23/2013  Number of meals per day? 3  B- breakfast pizza- eggs and bacon  L- meat, starch, soup  D- meat, starch, vegetable  Snacks? Sometimes dessert, bigger dinner, ice cream   Beverages? 12 beers, 1 cup of coffee a day, water, unsweet tea.  Patient is on Vitamin D supplement. - 50,000 IU Daily Lab Results  Component Value Date   VD25OH 61 10/23/2013     ROS- Review of Systems  Constitutional: Negative.  Negative for fever, chills,  malaise/fatigue and diaphoresis.  HENT: Negative.  Negative for congestion, ear discharge, ear pain and sore throat.   Eyes: Negative.   Respiratory: Negative.  Negative for cough, sputum production, shortness of breath and wheezing.   Cardiovascular: Negative.  Negative for chest pain and leg swelling.  Gastrointestinal: Negative.  Negative for nausea, vomiting, abdominal pain, diarrhea and constipation.  Genitourinary: Negative.  Negative for dysuria, urgency and frequency.  Musculoskeletal: Positive for joint pain. Negative for myalgias.       Patient has pain in right foot due to recent stress fracture and is wearing brace.  Skin: Negative.  Negative for rash.  Neurological: Negative.  Negative for dizziness and headaches.  Psychiatric/Behavioral: Negative.  Negative for depression. The patient is not nervous/anxious.    Medical History:  Past Medical History  Diagnosis Date  . Hyperlipidemia   . GERD (gastroesophageal reflux disease)   . Unspecified vitamin D deficiency   . Other testicular hypofunction   . Atrial fibrillation with rapid ventricular response    Current Medications:  Current Outpatient Prescriptions on File Prior to Visit  Medication Sig Dispense Refill  . CRESTOR 20 MG tablet take 1 tablet by mouth once daily 30 tablet 0  . Multiple Vitamin (MULTIVITAMIN) tablet Take 1 tablet by mouth daily.    . Omega-3 Fatty Acids (FISH OIL) 1000 MG CAPS Take 1,000 mg by mouth daily.    . psyllium (HYDROCIL/METAMUCIL) 95 % PACK Take 1  packet by mouth daily.    . Syringe/Needle, Disp, (SYRINGE 3CC/21GX1") 21G X 1" 3 ML MISC Use AD 100 each 0  . testosterone cypionate (DEPOTESTOTERONE CYPIONATE) 200 MG/ML injection Inject 1 mL (200 mg total) into the muscle every 14 (fourteen) days. 10 mL 5  . Vitamin D, Ergocalciferol, (DRISDOL) 50000 UNITS CAPS Take 50,000 Units by mouth daily.     Marland Kitchen diltiazem (CARDIZEM) 60 MG tablet Take 1 tablet (60 mg total) by mouth 4 (four) times daily as  needed. (Patient not taking: Reported on 01/24/2014) 120 tablet 6   No current facility-administered medications on file prior to visit.   Allergies:  Allergies  Allergen Reactions  . Pravastatin     Fatigue    Family history- Review and unchanged Social history- Review and unchanged  Physical Exam: BP 132/88 mmHg  Pulse 80  Temp(Src) 98.2 F (36.8 C) (Temporal)  Resp 18  Ht 6\' 1"  (1.854 m)  SpO2 98% Wt Readings from Last 3 Encounters:  10/23/13 220 lb 9.6 oz (100.064 kg)  06/04/13 213 lb (96.616 kg)  05/08/13 221 lb 6.4 oz (100.426 kg)  Vitals Reviewed. General Appearance: Well nourished, in no apparent distress. Eyes: PERRLA, EOMIs, conjunctiva no swelling or erythema.  No scleral icterus. Sinuses: No Frontal/maxillary tenderness ENT/Mouth: External auditory canals clear, TMs without erythema, edema or bulging. No erythema, edema, or exudate on posterior pharynx.  Tonsils not swollen or erythematous. Hearing normal.  Neck: Supple, thyroid normal.  Respiratory: Respiratory effort normal, CTAB.  No w/r/r or stridor.  Cardio: RRR.  m/r/g. S1S2nl. Brisk peripheral pulses without edema. Patient has brace on right leg due to recent stress fracture in right foot. Abdomen: Soft, + normal BS.  Non tender, no guarding, rebound, hernias, masses. Lymphatics: Non tender without lymphadenopathy.  Musculoskeletal: Full ROM, 5/5 strength, normal gait.  Skin: Warm, dry intact without rashes, lesions, ecchymosis.  Neuro: Cranial nerves intact. No cerebellar symptoms. Sensation intact.  Psych: Awake and oriented X 3, normal affect, Insight and Judgment appropriate.   Assessment and Plan:  1. Elevated BP Monitor blood pressure at home.  Reminder to go to the ER if any CP, SOB, nausea, dizziness, severe HA, changes vision/speech, left arm numbness and tingling, and jaw pain. Continue Aspirin daily for prevention of clots and Cardizem as needed for paroxysmal AFib. - CBC with Differential -  BASIC METABOLIC PANEL WITH GFR - Hepatic function panel  2. Mixed hyperlipidemia Continue Crestor and Fish Oil as prescribed.  Might cut back on Crestor depending on labs.  Check cholesterol. - Lipid panel  3. Prediabetes Please follow recommended diet and exercise.  Check A1C and Insulin levels. - Hemoglobin A1c - Insulin, fasting  4. Vitamin D deficiency Continue vitamin D as prescribed.  Check vitamin D level. - Vit D  25 hydroxy   5. Testosterone deficiency Continue testosterone as prescribed.  Check Testosterone level. - Testosterone  6. Medication management Will monitor kidney and liver function. - CBC with Differential - BASIC METABOLIC PANEL WITH GFR - Hepatic function panel  Continue diet and meds as discussed. Further disposition pending results of labs. Discussed medication effects and SE's.  Pt agreed to treatment plan. Please keep your physical appt on 04/11/14.   Marvie Brevik, Stephani Police, PA-C 12:16 PM [facility]

## 2014-01-24 NOTE — Patient Instructions (Addendum)
-  continue medications as prescribed. Please remember to take Aspirin every day to help prevent clots.  Please follow up in April 2016 for physical.  GIVE PT FOOD CHOICE LISTS FOR MEAL PLANNING  1)The amount of food you eat is important  -Too much can increase glucose levels and cause you to gain weight (which can  also increase glucose levels.  -Too little can decrease glucose level to unsafe levels (<70) 2)Eat meals and snacks at the same time each day to help your diabetes medication help you.  If you eat at different times each day, then the medication will not be as effective. 3)Do NOT skip meals or eat meals later than usual.  If you skip meals, then your glucose level can go low (<70).  Eating meals later than usual will not help the medication work effectively. 4) Amount of carbohydrates (carbs) per meal  -Breakfast- 30-45 grams of carbs  -Lunch and Dinner- 45-60 grams of carbs  -Snacks- 15-30 grams of carbs 5)Low fat foods have no more than 3 grams of fat per serving.  -Saturated- look for less than 1 gram per serving  -Trans Fat- look for 0 grams per serving 6)Exercise at least 120 minutes per week  Exercise Benefits:   -Lower LDL (Bad cholesterol)   -Lower blood pressure   -Increase HDL (Good cholesterol)   -Strengthen heart, lungs and muscles   -Burn calories and relieve stress   -Sleep better and help you feel better overall  To lower your risk of heart disease, limit your intake of saturated fat and trans fat as much as possible. THE GOOD WHAT IT DOES WHERE IT'S FOUND  MONOUNSATURATED FAT Lowers LDL and maybe raises HDL cholesterol Canola oil, olives, olive oil, peanuts, peanut oil, avocados, nuts  POLYUNSATURATED FAT Lowers LDL cholesterol Corn, safflower, sunflower and soybean oils, nuts, seeds  OMEGA-3 FATTY ACIDS Lowers triglycerides (blood fats) and blood pressure Salmon, mackerel, herring, sardines, flax seed, flaxseed oil, walnuts, soybean oil  THE BAD WHAT IT  DOES WHERE IT'S FOUND  SATURATED FAT Raises LDL (bad) cholesterol Butter, shortening, lard, red meat, cheese, whole milk, ice cream, coconut and palm oils  TRANS FAT Raises LDL cholesterol, lowers HDL (good) cholesterol Fried foods, some stick margarines, some cookies and crackers (look for hydrogenated fat on the ingredient list)  CHOLESTEROL FROM FOOD Too much may raise cholesterol levels Meat, poultry, seafood, eggs, milk, cheese, yogurt, butter   Plate Method (How much food of each food group) 1) Fill one half of your plate with nonstarchy vegetables: lettuce, broccoli, green beans, spinach, carrots or peppers. 2) Fill one quarter with protein: chicken, [place], fish, lean meat, eggs or tofu. 3) Fill one quarter with a nutritious carbohydrate food: brown rice, whole-wheat pasta, whole-wheat bread, peas, or corn.  Choose whole-wheat carbs for extra nutrition.  Controlling carbs helps you control your blood glucose. 4) Include a small piece of fruit at each meal, as well as 8 ounces of lowfat milk or yogurt. 5) Add 1-2 teaspoons of heart-healthy fat, such as olive or canola oil, trans fat-free margarine, avocado, nuts or seeds.

## 2014-01-25 ENCOUNTER — Ambulatory Visit: Payer: Self-pay | Admitting: Physician Assistant

## 2014-01-25 LAB — HEPATIC FUNCTION PANEL
ALT: 52 U/L (ref 0–53)
AST: 34 U/L (ref 0–37)
Albumin: 4.7 g/dL (ref 3.5–5.2)
Alkaline Phosphatase: 47 U/L (ref 39–117)
Bilirubin, Direct: 0.1 mg/dL (ref 0.0–0.3)
Indirect Bilirubin: 0.5 mg/dL (ref 0.2–1.2)
Total Bilirubin: 0.6 mg/dL (ref 0.2–1.2)
Total Protein: 7.6 g/dL (ref 6.0–8.3)

## 2014-01-25 LAB — BASIC METABOLIC PANEL WITH GFR
BUN: 17 mg/dL (ref 6–23)
CO2: 24 mEq/L (ref 19–32)
CREATININE: 0.84 mg/dL (ref 0.50–1.35)
Calcium: 9.8 mg/dL (ref 8.4–10.5)
Chloride: 104 mEq/L (ref 96–112)
Glucose, Bld: 97 mg/dL (ref 70–99)
POTASSIUM: 4.7 meq/L (ref 3.5–5.3)
Sodium: 140 mEq/L (ref 135–145)

## 2014-01-25 LAB — CBC WITH DIFFERENTIAL/PLATELET
BASOS ABS: 0 10*3/uL (ref 0.0–0.1)
BASOS PCT: 0 % (ref 0–1)
Eosinophils Absolute: 0.2 10*3/uL (ref 0.0–0.7)
Eosinophils Relative: 3 % (ref 0–5)
HCT: 48.9 % (ref 39.0–52.0)
Hemoglobin: 16.6 g/dL (ref 13.0–17.0)
Lymphocytes Relative: 35 % (ref 12–46)
Lymphs Abs: 2.4 10*3/uL (ref 0.7–4.0)
MCH: 32.3 pg (ref 26.0–34.0)
MCHC: 33.9 g/dL (ref 30.0–36.0)
MCV: 95.1 fL (ref 78.0–100.0)
MONOS PCT: 12 % (ref 3–12)
MPV: 10.5 fL (ref 8.6–12.4)
Monocytes Absolute: 0.8 10*3/uL (ref 0.1–1.0)
Neutro Abs: 3.4 10*3/uL (ref 1.7–7.7)
Neutrophils Relative %: 50 % (ref 43–77)
Platelets: 164 10*3/uL (ref 150–400)
RBC: 5.14 MIL/uL (ref 4.22–5.81)
RDW: 13.3 % (ref 11.5–15.5)
WBC: 6.8 10*3/uL (ref 4.0–10.5)

## 2014-01-25 LAB — LIPID PANEL
CHOL/HDL RATIO: 2.6 ratio
Cholesterol: 208 mg/dL — ABNORMAL HIGH (ref 0–200)
HDL: 80 mg/dL (ref >39)
LDL Cholesterol: 107 mg/dL — ABNORMAL HIGH (ref 0–99)
Triglycerides: 104 mg/dL (ref <150)
VLDL: 21 mg/dL (ref 0–40)

## 2014-01-25 LAB — TESTOSTERONE: Testosterone: 422 ng/dL (ref 300–890)

## 2014-01-25 LAB — HEMOGLOBIN A1C
HEMOGLOBIN A1C: 5.3 % (ref <5.7)
MEAN PLASMA GLUCOSE: 105 mg/dL (ref <117)

## 2014-01-25 LAB — INSULIN, FASTING: Insulin fasting, serum: 1.9 u[IU]/mL — ABNORMAL LOW (ref 2.0–19.6)

## 2014-01-25 LAB — VITAMIN D 25 HYDROXY (VIT D DEFICIENCY, FRACTURES): VIT D 25 HYDROXY: 50 ng/mL (ref 30–100)

## 2014-02-28 ENCOUNTER — Encounter: Payer: Self-pay | Admitting: Emergency Medicine

## 2014-03-28 ENCOUNTER — Encounter: Payer: Self-pay | Admitting: Physician Assistant

## 2014-03-28 ENCOUNTER — Other Ambulatory Visit: Payer: Self-pay | Admitting: Physician Assistant

## 2014-03-28 MED ORDER — AZITHROMYCIN 250 MG PO TABS: ORAL_TABLET | ORAL | Status: DC

## 2014-03-28 NOTE — Progress Notes (Signed)
Patient called with complainants  of symptoms of a URI. Symptoms include productive cough with  green colored sputum, sinus pressure, sore throat and wheezing. Onset of symptoms was 7 days ago, and has been gradually worsening since that time. Treatment to date: none and due to fear of taking something with his Afib, patient is not on coumadin. Marland Kitchen

## 2014-04-11 ENCOUNTER — Encounter: Payer: Self-pay | Admitting: Internal Medicine

## 2014-04-12 ENCOUNTER — Other Ambulatory Visit: Payer: Self-pay | Admitting: Internal Medicine

## 2014-05-21 ENCOUNTER — Telehealth: Payer: Self-pay | Admitting: *Deleted

## 2014-05-21 NOTE — Telephone Encounter (Signed)
Patient called and states he is trying to have to have a child and is concerned Testosterone can make is sperm count low.  Per Dr  Melford Aase, Michael Kim to stop the testosterone, but it will not lower his sprem count. Patient aware.

## 2014-05-27 ENCOUNTER — Encounter: Payer: Self-pay | Admitting: Internal Medicine

## 2014-06-05 ENCOUNTER — Telehealth: Payer: Self-pay | Admitting: Physician Assistant

## 2014-06-05 NOTE — Telephone Encounter (Addendum)
Patient  has a past medical history of Hyperlipidemia; GERD (gastroesophageal reflux disease); Unspecified vitamin D deficiency; Other testicular hypofunction; and Atrial fibrillation with rapid ventricular response., called complaining of of possible sinusitis. Symptoms include bilateral ear pressure/pain, congestion, productive cough with  yellow colored sputum, purulent nasal discharge and sore throat. Onset of symptoms was 2 days ago, and has been gradually worsening since that time. Treatment to date: none.  Has no future appointment and has not been seen since Jan.  Will require OV

## 2014-06-06 ENCOUNTER — Ambulatory Visit (INDEPENDENT_AMBULATORY_CARE_PROVIDER_SITE_OTHER): Payer: Commercial Managed Care - PPO | Admitting: Physician Assistant

## 2014-06-06 VITALS — BP 112/78 | HR 73 | Temp 98.9°F | Resp 18 | Ht 73.0 in | Wt 212.4 lb

## 2014-06-06 DIAGNOSIS — J069 Acute upper respiratory infection, unspecified: Secondary | ICD-10-CM | POA: Diagnosis not present

## 2014-06-06 DIAGNOSIS — B9789 Other viral agents as the cause of diseases classified elsewhere: Principal | ICD-10-CM

## 2014-06-06 MED ORDER — BENZONATATE 100 MG PO CAPS: ORAL_CAPSULE | ORAL | Status: AC

## 2014-06-06 MED ORDER — GUAIFENESIN ER 1200 MG PO TB12: 1.0000 | ORAL_TABLET | Freq: Two times a day (BID) | ORAL | Status: AC

## 2014-06-06 MED ORDER — IPRATROPIUM BROMIDE 0.06 % NA SOLN: 2.0000 | Freq: Three times a day (TID) | NASAL | Status: DC

## 2014-06-06 NOTE — Patient Instructions (Signed)
Do not use anything that says decongestant or cough suppressant.  It is ok to use Mucinex blue box to help with congestion.  Use steam for congestion and tylenol or motrin to help with myalgias.

## 2014-06-06 NOTE — Progress Notes (Signed)
Michael Kim  MRN: [id number] DOB: 05-Sep-1970  Subjective:  Pt presents to clinic with 3 day h/o cold symptoms. He started with a sore throat that has improved.  He has nasal congestion with yellow rhinorrhea and PND with a productive cough.  He is worried to take any OTC medications due to his h/o a fib.  He overall feels better today but he missed work because he felt bad and that is really abnormal for him. He is having some chest tightness and trouble getting a deep breath.  His wife has an albuterol inhaler but he has not used it because he is afraid to use any medications due to his a fib.  OTC treatments - none Sick contacts - none know Patient Active Problem List   Diagnosis Date Noted  . Prediabetes 01/24/2014  . Elevated BP 10/23/2013  . Elevated glucose 10/23/2013  . Medication management 10/23/2013  . Atrial fibrillation with rapid ventricular response 05/07/2013  . Mixed hyperlipidemia 04/09/2013  . Vitamin D deficiency   . Testosterone deficiency   . Rectal bleeding 03/09/2011    Past Medical History  Diagnosis Date  . Hyperlipidemia   . GERD (gastroesophageal reflux disease)   . Unspecified vitamin D deficiency   . Other testicular hypofunction   . Atrial fibrillation with rapid ventricular response    Outpatient Prescriptions Prior to Visit  Medication Sig Dispense Refill  . Multiple Vitamin (MULTIVITAMIN) tablet Take 1 tablet by mouth daily.    . psyllium (HYDROCIL/METAMUCIL) 95 % PACK Take 1 packet by mouth daily.    . Syringe/Needle, Disp, (SYRINGE 3CC/21GX1") 21G X 1" 3 ML MISC Use AD 100 each 0  . Vitamin D, Ergocalciferol, (DRISDOL) 50000 UNITS CAPS Take 50,000 Units by mouth daily.     Marland Kitchen diltiazem (CARDIZEM) 60 MG tablet Take 1 tablet (60 mg total) by mouth 4 (four) times daily as needed. (Patient not taking: Reported on 06/06/2014) 120 tablet 6  . Omega-3 Fatty Acids (FISH OIL) 1000 MG CAPS Take 1,000 mg by mouth daily.    Marland Kitchen testosterone cypionate  (DEPOTESTOTERONE CYPIONATE) 200 MG/ML injection Inject 1 mL (200 mg total) into the muscle every 14 (fourteen) days. (Patient not taking: Reported on 06/06/2014) 10 mL 5  . azithromycin (ZITHROMAX) 250 MG tablet Take 2 tablets PO on day 1, then 1 tablet PO Q24H x 4 days (Patient not taking: Reported on 06/06/2014) 6 tablet 1  . CRESTOR 20 MG tablet take 1 tablet by mouth once daily (Patient not taking: Reported on 06/06/2014) 30 tablet 0   No facility-administered medications prior to visit.   Allergies  Allergen Reactions  . Pravastatin     Fatigue    Review of Systems  Constitutional: Positive for malaise/fatigue.  HENT: Positive for congestion (yellow rhinorrhea) and sore throat.   Respiratory: Positive for cough, sputum production (yellow), shortness of breath and wheezing.        No h/o asthma, nonsmoker  Gastrointestinal: Positive for diarrhea.  Neurological: Positive for headaches.    Objective:  Physical Exam  Constitutional: He is oriented to person, place, and time and well-developed, well-nourished, and in no distress.  BP 112/78 mmHg  Pulse 73  Temp(Src) 98.9 F (37.2 C) (Oral)  Resp 18  Ht 6\' 1"  (1.854 m)  Wt 212 lb 6.4 oz (96.344 kg)  BMI 28.03 kg/m2  SpO2 97%   HENT:  Head: Normocephalic and atraumatic.  Right Ear: Hearing, tympanic membrane, external ear and ear canal normal.  Left Ear:  Hearing, tympanic membrane, external ear and ear canal normal.  Nose: Nose normal.  Mouth/Throat: Uvula is midline, oropharynx is clear and moist and mucous membranes are normal.  Eyes: Conjunctivae are normal.  Neck: Normal range of motion.  Cardiovascular: Normal rate, regular rhythm and normal heart sounds.   Pulmonary/Chest: Effort normal and breath sounds normal. He has no wheezes (no wheezing with forced expiration).  Lymphadenopathy:       Head (right side): No tonsillar adenopathy present.       Head (left side): No tonsillar adenopathy present.    He has no cervical  adenopathy.       Right: No supraclavicular adenopathy present.       Left: No supraclavicular adenopathy present.  Neurological: He is alert and oriented to person, place, and time. Gait normal.  Skin: Skin is warm and dry.  Psychiatric: Mood, memory, affect and judgment normal.    Assessment and Plan :  Viral URI with cough - Plan: ipratropium (ATROVENT) 0.06 % nasal spray, Guaifenesin (MUCINEX MAXIMUM STRENGTH) 1200 MG TB12, benzonatate (TESSALON) 100 MG capsule   Symptomatic care d/w pt.  Due to his h/o a fib we will not use albuterol or xopenex because at this time with his symptoms improving it is not worth the risk of triggering his a fib.  Windell Hummingbird PA-C  [facility] 06/06/2014 12:49 PM

## 2014-06-25 ENCOUNTER — Ambulatory Visit (INDEPENDENT_AMBULATORY_CARE_PROVIDER_SITE_OTHER): Payer: Commercial Managed Care - PPO | Admitting: Internal Medicine

## 2014-06-25 ENCOUNTER — Encounter: Payer: Self-pay | Admitting: Internal Medicine

## 2014-06-25 VITALS — BP 128/86 | HR 62 | Temp 98.2°F | Resp 18 | Ht 73.0 in | Wt 219.0 lb

## 2014-06-25 DIAGNOSIS — R7303 Prediabetes: Secondary | ICD-10-CM

## 2014-06-25 DIAGNOSIS — Z1212 Encounter for screening for malignant neoplasm of rectum: Secondary | ICD-10-CM

## 2014-06-25 DIAGNOSIS — R030 Elevated blood-pressure reading, without diagnosis of hypertension: Secondary | ICD-10-CM

## 2014-06-25 DIAGNOSIS — Z789 Other specified health status: Secondary | ICD-10-CM

## 2014-06-25 DIAGNOSIS — R5383 Other fatigue: Secondary | ICD-10-CM

## 2014-06-25 DIAGNOSIS — R7309 Other abnormal glucose: Secondary | ICD-10-CM

## 2014-06-25 DIAGNOSIS — Z125 Encounter for screening for malignant neoplasm of prostate: Secondary | ICD-10-CM

## 2014-06-25 DIAGNOSIS — E782 Mixed hyperlipidemia: Secondary | ICD-10-CM

## 2014-06-25 DIAGNOSIS — E291 Testicular hypofunction: Secondary | ICD-10-CM

## 2014-06-25 DIAGNOSIS — E559 Vitamin D deficiency, unspecified: Secondary | ICD-10-CM

## 2014-06-25 DIAGNOSIS — IMO0001 Reserved for inherently not codable concepts without codable children: Secondary | ICD-10-CM

## 2014-06-25 DIAGNOSIS — Z79899 Other long term (current) drug therapy: Secondary | ICD-10-CM

## 2014-06-25 DIAGNOSIS — E349 Endocrine disorder, unspecified: Secondary | ICD-10-CM

## 2014-06-25 NOTE — Patient Instructions (Signed)
Sertraline tablets What is this medicine? SERTRALINE (SER tra leen) is used to treat depression. It may also be used to treat obsessive compulsive disorder, panic disorder, post-trauma stress, premenstrual dysphoric disorder (PMDD) or social anxiety. This medicine may be used for other purposes; ask your health care provider or pharmacist if you have questions. COMMON BRAND NAME(S): Zoloft What should I tell my health care provider before I take this medicine? They need to know if you have any of these conditions: -bipolar disorder or a family history of bipolar disorder -diabetes -glaucoma -heart disease -high blood pressure -history of irregular heartbeat -history of low levels of calcium, magnesium, or potassium in the blood -if you often drink alcohol -liver disease -receiving electroconvulsive therapy -seizures -suicidal thoughts, plans, or attempt; a previous suicide attempt by you or a family member -thyroid disease -an unusual or allergic reaction to sertraline, other medicines, foods, dyes, or preservatives -pregnant or trying to get pregnant -breast-feeding How should I use this medicine? Take this medicine by mouth with a glass of water. Follow the directions on the prescription label. You can take it with or without food. Take your medicine at regular intervals. Do not take your medicine more often than directed. Do not stop taking this medicine suddenly except upon the advice of your doctor. Stopping this medicine too quickly may cause serious side effects or your condition may worsen. A special MedGuide will be given to you by the pharmacist with each prescription and refill. Be sure to read this information carefully each time. Talk to your pediatrician regarding the use of this medicine in children. While this drug may be prescribed for children as young as 7 years for selected conditions, precautions do apply. Overdosage: If you think you have taken too much of this  medicine contact a poison control center or emergency room at once. NOTE: This medicine is only for you. Do not share this medicine with others. What if I miss a dose? If you miss a dose, take it as soon as you can. If it is almost time for your next dose, take only that dose. Do not take double or extra doses. What may interact with this medicine? Do not take this medicine with any of the following medications: -certain medicines for fungal infections like fluconazole, itraconazole, ketoconazole, posaconazole, voriconazole -cisapride -disulfiram -dofetilide -linezolid -MAOIs like Carbex, Eldepryl, Marplan, Nardil, and Parnate -metronidazole -methylene blue (injected into a vein) -pimozide -thioridazine -ziprasidone This medicine may also interact with the following medications: -alcohol -aspirin and aspirin-like medicines -certain medicines for depression, anxiety, or psychotic disturbances -certain medicines for irregular heart beat like flecainide, propafenone -certain medicines for migraine headaches like almotriptan, eletriptan, frovatriptan, naratriptan, rizatriptan, sumatriptan, zolmitriptan -certain medicines for sleep -certain medicines for seizures like carbamazepine, valproic acid, phenytoin -certain medicines that treat or prevent blood clots like warfarin, enoxaparin, dalteparin -cimetidine -digoxin -diuretics -fentanyl -furazolidone -isoniazid -lithium -NSAIDs, medicines for pain and inflammation, like ibuprofen or naproxen -other medicines that prolong the QT interval (cause an abnormal heart rhythm) -procarbazine -rasagiline -supplements like St. John's wort, kava kava, valerian -tolbutamide -tramadol -tryptophan This list may not describe all possible interactions. Give your health care provider a list of all the medicines, herbs, non-prescription drugs, or dietary supplements you use. Also tell them if you smoke, drink alcohol, or use illegal drugs. Some  items may interact with your medicine. What should I watch for while using this medicine? Tell your doctor if your symptoms do not get better or if they get   worse. Visit your doctor or health care professional for regular checks on your progress. Because it may take several weeks to see the full effects of this medicine, it is important to continue your treatment as prescribed by your doctor. Patients and their families should watch out for new or worsening thoughts of suicide or depression. Also watch out for sudden changes in feelings such as feeling anxious, agitated, panicky, irritable, hostile, aggressive, impulsive, severely restless, overly excited and hyperactive, or not being able to sleep. If this happens, especially at the beginning of treatment or after a change in dose, call your health care professional. Dennis Bast may get drowsy or dizzy. Do not drive, use machinery, or do anything that needs mental alertness until you know how this medicine affects you. Do not stand or sit up quickly, especially if you are an older patient. This reduces the risk of dizzy or fainting spells. Alcohol may interfere with the effect of this medicine. Avoid alcoholic drinks. Your mouth may get dry. Chewing sugarless gum or sucking hard candy, and drinking plenty of water may help. Contact your doctor if the problem does not go away or is severe. What side effects may I notice from receiving this medicine? Side effects that you should report to your doctor or health care professional as soon as possible: -allergic reactions like skin rash, itching or hives, swelling of the face, lips, or tongue -black or bloody stools, blood in the urine or vomit -fast, irregular heartbeat -feeling faint or lightheaded, falls -hallucination, loss of contact with reality -seizures -suicidal thoughts or other mood changes -unusual bleeding or bruising -unusually weak or tired -vomiting Side effects that usually do not require  medical attention (report to your doctor or health care professional if they continue or are bothersome): -change in appetite -change in sex drive or performance -diarrhea -increased sweating -indigestion, nausea -tremors This list may not describe all possible side effects. Call your doctor for medical advice about side effects. You may report side effects to FDA at 1-800-FDA-1088. Where should I keep my medicine? Keep out of the reach of children. Store at room temperature between 15 and 30 degrees C (59 and 86 degrees F). Throw away any unused medicine after the expiration date. NOTE: This sheet is a summary. It may not cover all possible information. If you have questions about this medicine, talk to your doctor, pharmacist, or health care provider.  2015, [employer]. (2012-07-18 12:57:35)   Preventive Care for Adults  A healthy lifestyle and preventive care can promote health and wellness. Preventive health guidelines for men include the following key practices:  A routine yearly physical is a good way to check with your health care provider about your health and preventative screening. It is a chance to share any concerns and updates on your health and to receive a thorough exam.  Visit your dentist for a routine exam and preventative care every 6 months. Brush your teeth twice a day and floss once a day. Good oral hygiene prevents tooth decay and gum disease.  The frequency of eye exams is based on your age, health, family medical history, use of contact lenses, and other factors. Follow your health care provider's recommendations for frequency of eye exams.  Eat a healthy diet. Foods such as vegetables, fruits, whole grains, low-fat dairy products, and lean protein foods contain the nutrients you need without too many calories. Decrease your intake of foods high in solid fats, added sugars, and salt. Eat the right amount  of calories for you.Get information about a  proper diet from your health care provider, if necessary.  Regular physical exercise is one of the most important things you can do for your health. Most adults should get at least 150 minutes of moderate-intensity exercise (any activity that increases your heart rate and causes you to sweat) each week. In addition, most adults need muscle-strengthening exercises on 2 or more days a week.  Maintain a healthy weight. The body mass index (BMI) is a screening tool to identify possible weight problems. It provides an estimate of body fat based on height and weight. Your health care provider can find your BMI and can help you achieve or maintain a healthy weight.For adults 20 years and older:  A BMI below 18.5 is considered underweight.  A BMI of 18.5 to 24.9 is normal.  A BMI of 25 to 29.9 is considered overweight.  A BMI of 30 and above is considered obese.  Maintain normal blood lipids and cholesterol levels by exercising and minimizing your intake of saturated fat. Eat a balanced diet with plenty of fruit and vegetables. Blood tests for lipids and cholesterol should begin at age 73 and be repeated every 5 years. If your lipid or cholesterol levels are high, you are over 50, or you are at high risk for heart disease, you may need your cholesterol levels checked more frequently.Ongoing high lipid and cholesterol levels should be treated with medicines if diet and exercise are not working.  If you smoke, find out from your health care provider how to quit. If you do not use tobacco, do not start.  Lung cancer screening is recommended for adults aged 59-80 years who are at high risk for developing lung cancer because of a history of smoking. A yearly low-dose CT scan of the lungs is recommended for people who have at least a 30-pack-year history of smoking and are a current smoker or have quit within the past 15 years. A pack year of smoking is smoking an average of 1 pack of cigarettes a day for 1  year (for example: 1 pack a day for 30 years or 2 packs a day for 15 years). Yearly screening should continue until the smoker has stopped smoking for at least 15 years. Yearly screening should be stopped for people who develop a health problem that would prevent them from having lung cancer treatment.  If you choose to drink alcohol, do not have more than 2 drinks per day. One drink is considered to be 12 ounces (355 mL) of beer, 5 ounces (148 mL) of wine, or 1.5 ounces (44 mL) of liquor.  Avoid use of street drugs. Do not share needles with anyone. Ask for help if you need support or instructions about stopping the use of drugs.  High blood pressure causes heart disease and increases the risk of stroke. Your blood pressure should be checked at least every 1-2 years. Ongoing high blood pressure should be treated with medicines, if weight loss and exercise are not effective.  If you are 32-48 years old, ask your health care provider if you should take aspirin to prevent heart disease.  Diabetes screening involves taking a blood sample to check your fasting blood sugar level. This should be done once every 3 years, after age 52, if you are within normal weight and without risk factors for diabetes. Testing should be considered at a younger age or be carried out more frequently if you are overweight and have at  least 1 risk factor for diabetes.  Colorectal cancer can be detected and often prevented. Most routine colorectal cancer screening begins at the age of 25 and continues through age 31. However, your health care provider may recommend screening at an earlier age if you have risk factors for colon cancer. On a yearly basis, your health care provider may provide home test kits to check for hidden blood in the stool. Use of a small camera at the end of a tube to directly examine the colon (sigmoidoscopy or colonoscopy) can detect the earliest forms of colorectal cancer. Talk to your health care provider  about this at age 26, when routine screening begins. Direct exam of the colon should be repeated every 5-10 years through age 74, unless early forms of precancerous polyps or small growths are found.   Talk with your health care provider about prostate cancer screening.  Testicular cancer screening isrecommended for adult males. Screening includes self-exam, a health care provider exam, and other screening tests. Consult with your health care provider about any symptoms you have or any concerns you have about testicular cancer.  Use sunscreen. Apply sunscreen liberally and repeatedly throughout the day. You should seek shade when your shadow is shorter than you. Protect yourself by wearing long sleeves, pants, a wide-brimmed hat, and sunglasses year round, whenever you are outdoors.  Once a month, do a whole-body skin exam, using a mirror to look at the skin on your back. Tell your health care provider about new moles, moles that have irregular borders, moles that are larger than a pencil eraser, or moles that have changed in shape or color.  Stay current with required vaccines (immunizations).  Influenza vaccine. All adults should be immunized every year.  Tetanus, diphtheria, and acellular pertussis (Td, Tdap) vaccine. An adult who has not previously received Tdap or who does not know his vaccine status should receive 1 dose of Tdap. This initial dose should be followed by tetanus and diphtheria toxoids (Td) booster doses every 10 years. Adults with an unknown or incomplete history of completing a 3-dose immunization series with Td-containing vaccines should begin or complete a primary immunization series including a Tdap dose. Adults should receive a Td booster every 10 years.  Varicella vaccine. An adult without evidence of immunity to varicella should receive 2 doses or a second dose if he has previously received 1 dose.  Human papillomavirus (HPV) vaccine. Males aged 5-21 years who have not  received the vaccine previously should receive the 3-dose series. Males aged 22-26 years may be immunized. Immunization is recommended through the age of 86 years for any male who has sex with males and did not get any or all doses earlier. Immunization is recommended for any person with an immunocompromised condition through the age of 25 years if he did not get any or all doses earlier. During the 3-dose series, the second dose should be obtained 4-8 weeks after the first dose. The third dose should be obtained 24 weeks after the first dose and 16 weeks after the second dose.  Zoster vaccine. One dose is recommended for adults aged 63 years or older unless certain conditions are present.    PREVNAR  - Pneumococcal 13-valent conjugate (PCV13) vaccine. When indicated, a person who is uncertain of his immunization history and has no record of immunization should receive the PCV13 vaccine. An adult aged 70 years or older who has certain medical conditions and has not been previously immunized should receive 1 dose of  PCV13 vaccine. This PCV13 should be followed with a dose of pneumococcal polysaccharide (PPSV23) vaccine. The PPSV23 vaccine dose should be obtained at least 8 weeks after the dose of PCV13 vaccine. An adult aged 86 years or older who has certain medical conditions and previously received 1 or more doses of PPSV23 vaccine should receive 1 dose of PCV13. The PCV13 vaccine dose should be obtained 1 or more years after the last PPSV23 vaccine dose.    PNEUMOVAX - Pneumococcal polysaccharide (PPSV23) vaccine. When PCV13 is also indicated, PCV13 should be obtained first. All adults aged 31 years and older should be immunized. An adult younger than age 37 years who has certain medical conditions should be immunized. Any person who resides in a nursing home or long-term care facility should be immunized. An adult smoker should be immunized. People with an immunocompromised condition and certain other  conditions should receive both PCV13 and PPSV23 vaccines. People with human immunodeficiency virus (HIV) infection should be immunized as soon as possible after diagnosis. Immunization during chemotherapy or radiation therapy should be avoided. Routine use of PPSV23 vaccine is not recommended for American Indians, [place]atives, or people younger than 65 years unless there are medical conditions that require PPSV23 vaccine. When indicated, people who have unknown immunization and have no record of immunization should receive PPSV23 vaccine. One-time revaccination 5 years after the first dose of PPSV23 is recommended for people aged 19-64 years who have chronic kidney failure, nephrotic syndrome, asplenia, or immunocompromised conditions. People who received 1-2 doses of PPSV23 before age 70 years should receive another dose of PPSV23 vaccine at age 60 years or later if at least 5 years have passed since the previous dose. Doses of PPSV23 are not needed for people immunized with PPSV23 at or after age 19 years.    Hepatitis A vaccine. Adults who wish to be protected from this disease, have certain high-risk conditions, work with hepatitis A-infected animals, work in hepatitis A research labs, or travel to or work in countries with a high rate of hepatitis A should be immunized. Adults who were previously unvaccinated and who anticipate close contact with an international adoptee during the first 60 days after arrival in the [place] from a country with a high rate of hepatitis A should be immunized.    Hepatitis B vaccine. Adults should be immunized if they wish to be protected from this disease, have certain high-risk conditions, may be exposed to blood or other infectious body fluids, are household contacts or sex partners of hepatitis B positive people, are clients or workers in certain care facilities, or travel to or work in countries with a high rate of hepatitis B.   Preventive Service /  Frequency   Ages 73 to 50  Blood pressure check.  Lipid and cholesterol check  Lung cancer screening. / Every year if you are aged 73-80 years and have a 30-pack-year history of smoking and currently smoke or have quit within the past 15 years. Yearly screening is stopped once you have quit smoking for at least 15 years or develop a health problem that would prevent you from having lung cancer treatment.  Fecal occult blood test (FOBT) of stool. / Every year beginning at age 51 and continuing until age 85. You may not have to do this test if you get a colonoscopy every 10 years.  Flexible sigmoidoscopy** or colonoscopy.** / Every 5 years for a flexible sigmoidoscopy or every 10 years for a colonoscopy beginning at age  41 and continuing until age 31. Screening for abdominal aortic aneurysm (AAA)  by ultrasound is recommended for people who have history of high blood pressure or who are current or former smokers.

## 2014-06-25 NOTE — Progress Notes (Signed)
Patient ID: Michael Kim, male   DOB: 1970-08-23, 44 y.o.   MRN: [id number]  Complete Physical  Assessment and Plan:   1. Prediabetes  - Hemoglobin A1c - Insulin, random  2. Vitamin D deficiency -cont supplement - Vit D  25 hydroxy (rtn osteoporosis monitoring)  3. Mixed hyperlipidemia -consider medication holiday if lipids okay -recheck in 4 months if okay - Lipid panel  4. Elevated BP  - Urinalysis, Routine w reflex microscopic (not at [facility]) - Microalbumin / creatinine urine ratio - EKG 12-Lead - TSH  5. Medication management  - CBC with Differential/Platelet - BASIC METABOLIC PANEL WITH GFR - Hepatic function panel - Magnesium  6. Screening for prostate cancer  - PSA  7. Other fatigue  - Iron and TIBC - Vitamin B12  8. Testosterone deficiency -currently off until is seen by urology - Testosterone    Discussed med's effects and SE's. Screening labs and tests as requested with regular follow-up as recommended.  HPI Patient presents for a complete physical.   His blood pressure has been controlled at home, today their BP is BP: 128/86 mmHg He does workout. He denies chest pain, shortness of breath, dizziness.   He is on cholesterol medication and denies myalgias. His cholesterol is at goal. The cholesterol last visit was:   Lab Results  Component Value Date   CHOL 208* 01/24/2014   HDL 80 01/24/2014   LDLCALC 107* 01/24/2014   TRIG 104 01/24/2014   CHOLHDL 2.6 01/24/2014    He has been working on diet and exercise for prediabetes, he is not on bASA, he is not on ACE/ARB and denies foot ulcerations, hyperglycemia, hypoglycemia , increased appetite, nausea, paresthesia of the feet, polydipsia, polyuria, visual disturbances, vomiting and weight loss. Last A1C in the office was:  Lab Results  Component Value Date   HGBA1C 5.3 01/24/2014  He does have salads daily for lunch and he does have ranch dressing.  He reports that at dinner time he  struggles  Patient is on Vitamin D supplement.   Lab Results  Component Value Date   VD25OH 50 01/24/2014      Last PSA was: Lab Results  Component Value Date   PSA 1.14 04/03/2013  .  Denies BPH symptoms daytime frequency, double voiding, dysuria, hematuria, hesitancy, incontinence, intermittency, nocturia, sensation of incomplete bladder emptying, suprapubic pain, urgency or weak urinary stream.  He reports that he and his wife are trying to get pregnant.  He reports that he has a 0 sperm count on an analysis and he is going to see a urologist.  He has not had an injection in 8 weeks.      Current Medications:  Current Outpatient Prescriptions on File Prior to Visit  Medication Sig Dispense Refill  . diltiazem (CARDIZEM) 60 MG tablet Take 1 tablet (60 mg total) by mouth 4 (four) times daily as needed. 120 tablet 6  . ipratropium (ATROVENT) 0.06 % nasal spray Place 2 sprays into the nose 3 (three) times daily. 15 mL 0  . Multiple Vitamin (MULTIVITAMIN) tablet Take 1 tablet by mouth daily.    . Omega-3 Fatty Acids (FISH OIL) 1000 MG CAPS Take 1,000 mg by mouth daily.    . psyllium (HYDROCIL/METAMUCIL) 95 % PACK Take 1 packet by mouth daily.    . rosuvastatin (CRESTOR) 10 MG tablet Take 10 mg by mouth daily.    . Syringe/Needle, Disp, (SYRINGE 3CC/21GX1") 21G X 1" 3 ML MISC Use AD 100 each 0  No current facility-administered medications on file prior to visit.    Health Maintenance:  Immunization History  Administered Date(s) Administered  . PPD Test 04/03/2013  . Td 11/08/2005   Patient does have an Uncle with colon cancer.  He is due to have another colonoscopy at 47.   Patient Care Team: Unk Pinto, MD as PCP - General (Internal Medicine) Milus Banister, MD as Attending Physician (Gastroenterology) Thornell Sartorius, MD as Consulting Physician (Otolaryngology) Willow Ora (Optometry) Justice Britain, MD as Consulting Physician (Orthopedic Surgery) Newman Pies,  MD as Consulting Physician (Neurosurgery)  Allergies:  Allergies  Allergen Reactions  . Pravastatin     Fatigue    Medical History:  Past Medical History  Diagnosis Date  . Hyperlipidemia   . GERD (gastroesophageal reflux disease)   . Unspecified vitamin D deficiency   . Other testicular hypofunction   . Atrial fibrillation with rapid ventricular response     Surgical History:  Past Surgical History  Procedure Laterality Date  . Shoulder surgery  2011    Family History:  Family History  Problem Relation Age of Onset  . Colon polyps Father   . Hyperlipidemia Father   . Colon cancer Paternal Uncle   . Lymphoma Mother   . Cancer Mother     lymphoma  . Depression Maternal Grandmother   . Cancer Maternal Grandmother 75    fatal colon  . Heart disease Paternal Grandfather     Social History:   History  Substance Use Topics  . Smoking status: Former Smoker    Quit date: 07/29/2006  . Smokeless tobacco: Never Used  . Alcohol Use: 9.0 oz/week    15 Cans of beer per week    Review of Systems:  Review of Systems  Constitutional: Negative for fever, chills and malaise/fatigue.  HENT: Negative for congestion, ear pain and sore throat.   Eyes: Negative.   Respiratory: Negative for cough, shortness of breath and wheezing.   Cardiovascular: Negative for chest pain, palpitations and leg swelling.  Gastrointestinal: Negative for heartburn, nausea, vomiting, diarrhea, constipation, blood in stool and melena.  Genitourinary: Negative.   Skin: Negative.   Neurological: Negative for dizziness, sensory change, loss of consciousness and headaches.  Psychiatric/Behavioral: Negative for depression. The patient is nervous/anxious. The patient does not have insomnia.     Physical Exam: Estimated body mass index is 28.9 kg/(m^2) as calculated from the following:   Height as of this encounter: 6\' 1"  (1.854 m).   Weight as of this encounter: 219 lb (99.338 kg). BP 128/86 mmHg   Pulse 62  Temp(Src) 98.2 F (36.8 C) (Temporal)  Resp 18  Ht 6\' 1"  (1.854 m)  Wt 219 lb (99.338 kg)  BMI 28.90 kg/m2  General Appearance: Well nourished, in no apparent distress.  Eyes: PERRLA, EOMs, conjunctiva no swelling or erythema ENT/Mouth: Ear canals clear bilaterally with no erythema, swelling, discharge.  TMs normal bilaterally with no erythema, bulging, or retractions.  Oropharynx clear and moist with no exudate, swelling, or erythema.  Dentition normal.   Neck: Supple, thyroid normal. No bruits, JVD, cervical adenopathy Respiratory: Respiratory effort normal, BS equal bilaterally without rales, rhonchi, wheezing or stridor.  Cardio: RRR without murmurs, rubs or gallops. Brisk peripheral pulses without edema.  Chest: symmetric, with normal excursions Abdomen: Soft, nontender, no guarding, rebound, hernias, masses, or organomegaly. Genitourinary: Deferred as prostate was examined by urology Musculoskeletal: Full ROM all peripheral extremities,5/5 strength, and normal gait.  Skin: Warm, dry without rashes, lesions, ecchymosis.  Neuro: A&Ox3, Cranial nerves intact, reflexes equal bilaterally. Normal muscle tone, no cerebellar symptoms. Sensation intact.  Psych: Normal affect, Insight and Judgment appropriate.   EKG: WNL no changes.  Over 40 minutes of exam, counseling, chart review and critical decision making was performed  Michael Kim, Michael Kim 3:24 PM [facility]

## 2014-06-26 LAB — CBC WITH DIFFERENTIAL/PLATELET
BASOS ABS: 0 10*3/uL (ref 0.0–0.1)
Basophils Relative: 0 % (ref 0–1)
Eosinophils Absolute: 0.2 10*3/uL (ref 0.0–0.7)
Eosinophils Relative: 3 % (ref 0–5)
HCT: 43.8 % (ref 39.0–52.0)
Hemoglobin: 15 g/dL (ref 13.0–17.0)
LYMPHS PCT: 42 % (ref 12–46)
Lymphs Abs: 2.8 10*3/uL (ref 0.7–4.0)
MCH: 31.9 pg (ref 26.0–34.0)
MCHC: 34.2 g/dL (ref 30.0–36.0)
MCV: 93.2 fL (ref 78.0–100.0)
MPV: 10.2 fL (ref 8.6–12.4)
Monocytes Absolute: 0.7 10*3/uL (ref 0.1–1.0)
Monocytes Relative: 11 % (ref 3–12)
NEUTROS ABS: 2.9 10*3/uL (ref 1.7–7.7)
Neutrophils Relative %: 44 % (ref 43–77)
PLATELETS: 161 10*3/uL (ref 150–400)
RBC: 4.7 MIL/uL (ref 4.22–5.81)
RDW: 13.2 % (ref 11.5–15.5)
WBC: 6.7 10*3/uL (ref 4.0–10.5)

## 2014-06-26 LAB — IRON AND TIBC
%SAT: 54 % (ref 20–55)
Iron: 180 ug/dL — ABNORMAL HIGH (ref 42–165)
TIBC: 335 ug/dL (ref 215–435)
UIBC: 155 ug/dL (ref 125–400)

## 2014-06-26 LAB — URINALYSIS, ROUTINE W REFLEX MICROSCOPIC
BILIRUBIN URINE: NEGATIVE
GLUCOSE, UA: NEGATIVE mg/dL
Hgb urine dipstick: NEGATIVE
KETONES UR: NEGATIVE mg/dL
Leukocytes, UA: NEGATIVE
Nitrite: NEGATIVE
Protein, ur: NEGATIVE mg/dL
SPECIFIC GRAVITY, URINE: 1.025 (ref 1.005–1.030)
UROBILINOGEN UA: 0.2 mg/dL (ref 0.0–1.0)
pH: 5.5 (ref 5.0–8.0)

## 2014-06-26 LAB — MICROALBUMIN / CREATININE URINE RATIO
CREATININE, URINE: 156.4 mg/dL
Microalb Creat Ratio: 2.6 mg/g (ref 0.0–30.0)
Microalb, Ur: 0.4 mg/dL (ref <2.0)

## 2014-06-26 LAB — BASIC METABOLIC PANEL WITH GFR
BUN: 24 mg/dL — ABNORMAL HIGH (ref 6–23)
CHLORIDE: 100 meq/L (ref 96–112)
CO2: 29 meq/L (ref 19–32)
Calcium: 9.7 mg/dL (ref 8.4–10.5)
Creat: 0.87 mg/dL (ref 0.50–1.35)
GFR, Est African American: >89 mL/min
GLUCOSE: 90 mg/dL (ref 70–99)
Potassium: 4.2 mEq/L (ref 3.5–5.3)
SODIUM: 139 meq/L (ref 135–145)

## 2014-06-26 LAB — MAGNESIUM: MAGNESIUM: 2.1 mg/dL (ref 1.5–2.5)

## 2014-06-26 LAB — LIPID PANEL
CHOLESTEROL: 212 mg/dL — AB (ref 0–200)
HDL: 81 mg/dL (ref >=40)
LDL Cholesterol: 98 mg/dL (ref 0–99)
TRIGLYCERIDES: 164 mg/dL — AB (ref <150)
Total CHOL/HDL Ratio: 2.6 Ratio
VLDL: 33 mg/dL (ref 0–40)

## 2014-06-26 LAB — HEPATIC FUNCTION PANEL
ALT: 32 U/L (ref 0–53)
AST: 24 U/L (ref 0–37)
Albumin: 4.2 g/dL (ref 3.5–5.2)
Alkaline Phosphatase: 43 U/L (ref 39–117)
BILIRUBIN DIRECT: 0.1 mg/dL (ref 0.0–0.3)
Indirect Bilirubin: 0.5 mg/dL (ref 0.2–1.2)
TOTAL PROTEIN: 7.1 g/dL (ref 6.0–8.3)
Total Bilirubin: 0.6 mg/dL (ref 0.2–1.2)

## 2014-06-26 LAB — VITAMIN B12: Vitamin B-12: 449 pg/mL (ref 211–911)

## 2014-06-26 LAB — TSH: TSH: 1.755 u[IU]/mL (ref 0.350–4.500)

## 2014-06-26 LAB — VITAMIN D 25 HYDROXY (VIT D DEFICIENCY, FRACTURES): Vit D, 25-Hydroxy: 55 ng/mL (ref 30–100)

## 2014-06-26 LAB — TESTOSTERONE: Testosterone: 220 ng/dL — ABNORMAL LOW (ref 300–890)

## 2014-06-26 LAB — INSULIN, RANDOM: Insulin: 3.7 u[IU]/mL (ref 2.0–19.6)

## 2014-06-26 LAB — HEMOGLOBIN A1C
Hgb A1c MFr Bld: 5.7 % — ABNORMAL HIGH (ref <5.7)
Mean Plasma Glucose: 117 mg/dL — ABNORMAL HIGH (ref <117)

## 2014-06-26 LAB — PSA: PSA: 0.96 ng/mL (ref <=4.00)

## 2014-08-05 ENCOUNTER — Other Ambulatory Visit: Payer: Self-pay | Admitting: Internal Medicine

## 2014-09-24 ENCOUNTER — Encounter: Payer: Self-pay | Admitting: *Deleted

## 2014-11-21 ENCOUNTER — Ambulatory Visit (INDEPENDENT_AMBULATORY_CARE_PROVIDER_SITE_OTHER): Payer: Commercial Managed Care - PPO | Admitting: Internal Medicine

## 2014-11-21 ENCOUNTER — Encounter: Payer: Self-pay | Admitting: Internal Medicine

## 2014-11-21 VITALS — BP 128/74 | HR 68 | Temp 98.6°F | Resp 18 | Ht 73.0 in | Wt 216.0 lb

## 2014-11-21 DIAGNOSIS — IMO0001 Reserved for inherently not codable concepts without codable children: Secondary | ICD-10-CM

## 2014-11-21 DIAGNOSIS — E782 Mixed hyperlipidemia: Secondary | ICD-10-CM

## 2014-11-21 DIAGNOSIS — E559 Vitamin D deficiency, unspecified: Secondary | ICD-10-CM | POA: Diagnosis not present

## 2014-11-21 DIAGNOSIS — R7309 Other abnormal glucose: Secondary | ICD-10-CM

## 2014-11-21 DIAGNOSIS — I4891 Unspecified atrial fibrillation: Secondary | ICD-10-CM

## 2014-11-21 DIAGNOSIS — R030 Elevated blood-pressure reading, without diagnosis of hypertension: Secondary | ICD-10-CM

## 2014-11-21 DIAGNOSIS — Z79899 Other long term (current) drug therapy: Secondary | ICD-10-CM

## 2014-11-21 MED ORDER — TADALAFIL 20 MG PO TABS
20.0000 mg | ORAL_TABLET | Freq: Every day | ORAL | Status: DC | PRN
Start: 2014-11-21 — End: 2015-11-20
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
## 2014-11-22 ENCOUNTER — Encounter: Payer: Self-pay | Admitting: Internal Medicine

## 2014-11-22 LAB — TSH: TSH: 1.355 u[IU]/mL (ref 0.350–4.500)

## 2014-11-22 LAB — BASIC METABOLIC PANEL WITH GFR
BUN: 28 mg/dL — ABNORMAL HIGH (ref 7–25)
CALCIUM: 9.6 mg/dL (ref 8.6–10.3)
CO2: 25 mmol/L (ref 20–31)
Chloride: 102 mmol/L (ref 98–110)
Creat: 0.8 mg/dL (ref 0.60–1.35)
Glucose, Bld: 92 mg/dL (ref 65–99)
POTASSIUM: 4.4 mmol/L (ref 3.5–5.3)
SODIUM: 138 mmol/L (ref 135–146)

## 2014-11-22 LAB — HEPATIC FUNCTION PANEL
ALK PHOS: 55 U/L (ref 40–115)
ALT: 31 U/L (ref 9–46)
AST: 23 U/L (ref 10–40)
Albumin: 4.3 g/dL (ref 3.6–5.1)
BILIRUBIN DIRECT: 0.1 mg/dL (ref <=0.2)
BILIRUBIN INDIRECT: 0.4 mg/dL (ref 0.2–1.2)
Total Bilirubin: 0.5 mg/dL (ref 0.2–1.2)
Total Protein: 7.1 g/dL (ref 6.1–8.1)

## 2014-11-22 LAB — CBC WITH DIFFERENTIAL/PLATELET
BASOS PCT: 0 % (ref 0–1)
Basophils Absolute: 0 10*3/uL (ref 0.0–0.1)
Eosinophils Absolute: 0.2 10*3/uL (ref 0.0–0.7)
Eosinophils Relative: 2 % (ref 0–5)
HCT: 43.3 % (ref 39.0–52.0)
Hemoglobin: 14.7 g/dL (ref 13.0–17.0)
Lymphocytes Relative: 34 % (ref 12–46)
Lymphs Abs: 2.6 10*3/uL (ref 0.7–4.0)
MCH: 32 pg (ref 26.0–34.0)
MCHC: 33.9 g/dL (ref 30.0–36.0)
MCV: 94.1 fL (ref 78.0–100.0)
MONO ABS: 1 10*3/uL (ref 0.1–1.0)
MONOS PCT: 13 % — AB (ref 3–12)
MPV: 10.7 fL (ref 8.6–12.4)
Neutro Abs: 3.9 10*3/uL (ref 1.7–7.7)
Neutrophils Relative %: 51 % (ref 43–77)
Platelets: 177 10*3/uL (ref 150–400)
RBC: 4.6 MIL/uL (ref 4.22–5.81)
RDW: 12.7 % (ref 11.5–15.5)
WBC: 7.7 10*3/uL (ref 4.0–10.5)

## 2014-11-22 LAB — HEMOGLOBIN A1C
Hgb A1c MFr Bld: 5.7 % — ABNORMAL HIGH (ref <5.7)
Mean Plasma Glucose: 117 mg/dL — ABNORMAL HIGH (ref <117)

## 2014-11-22 LAB — LIPID PANEL
CHOL/HDL RATIO: 3.9 ratio (ref <=5.0)
Cholesterol: 287 mg/dL — ABNORMAL HIGH (ref 125–200)
HDL: 73 mg/dL (ref >=40)
LDL CALC: 180 mg/dL — AB (ref <130)
TRIGLYCERIDES: 169 mg/dL — AB (ref <150)
VLDL: 34 mg/dL — AB (ref <30)

## 2014-11-25 ENCOUNTER — Other Ambulatory Visit: Payer: Self-pay | Admitting: Internal Medicine

## 2014-11-25 MED ORDER — ROSUVASTATIN CALCIUM 10 MG PO TABS
10.0000 mg | ORAL_TABLET | Freq: Every day | ORAL | Status: DC
Start: 2014-11-25 — End: 2015-11-25

## 2015-03-10 ENCOUNTER — Other Ambulatory Visit: Payer: Self-pay | Admitting: *Deleted

## 2015-03-10 MED ORDER — DILTIAZEM HCL 60 MG PO TABS: 60.0000 mg | ORAL_TABLET | Freq: Four times a day (QID) | ORAL | Status: DC | PRN

## 2015-05-27 ENCOUNTER — Encounter: Payer: Self-pay | Admitting: Internal Medicine

## 2015-06-25 ENCOUNTER — Encounter: Payer: Self-pay | Admitting: Internal Medicine

## 2015-06-25 ENCOUNTER — Ambulatory Visit (INDEPENDENT_AMBULATORY_CARE_PROVIDER_SITE_OTHER): Payer: Commercial Managed Care - PPO | Admitting: Internal Medicine

## 2015-06-25 VITALS — BP 124/80 | HR 58 | Temp 98.1°F | Resp 18 | Ht 72.0 in | Wt 217.0 lb

## 2015-06-25 DIAGNOSIS — Z136 Encounter for screening for cardiovascular disorders: Secondary | ICD-10-CM | POA: Diagnosis not present

## 2015-06-25 DIAGNOSIS — E559 Vitamin D deficiency, unspecified: Secondary | ICD-10-CM

## 2015-06-25 DIAGNOSIS — Z23 Encounter for immunization: Secondary | ICD-10-CM | POA: Diagnosis not present

## 2015-06-25 DIAGNOSIS — Z1389 Encounter for screening for other disorder: Secondary | ICD-10-CM

## 2015-06-25 DIAGNOSIS — Z131 Encounter for screening for diabetes mellitus: Secondary | ICD-10-CM

## 2015-06-25 DIAGNOSIS — Z0000 Encounter for general adult medical examination without abnormal findings: Secondary | ICD-10-CM | POA: Diagnosis not present

## 2015-06-25 DIAGNOSIS — Z8679 Personal history of other diseases of the circulatory system: Secondary | ICD-10-CM

## 2015-06-25 DIAGNOSIS — Z1329 Encounter for screening for other suspected endocrine disorder: Secondary | ICD-10-CM

## 2015-06-25 DIAGNOSIS — Z125 Encounter for screening for malignant neoplasm of prostate: Secondary | ICD-10-CM

## 2015-06-25 DIAGNOSIS — E349 Endocrine disorder, unspecified: Secondary | ICD-10-CM

## 2015-06-25 DIAGNOSIS — E785 Hyperlipidemia, unspecified: Secondary | ICD-10-CM

## 2015-06-25 DIAGNOSIS — Z130 Encounter for screening for diseases of the blood and blood-forming organs and certain disorders involving the immune mechanism: Secondary | ICD-10-CM

## 2015-06-25 LAB — CBC WITH DIFFERENTIAL/PLATELET
BASOS PCT: 0 %
Basophils Absolute: 0 cells/uL (ref 0–200)
EOS PCT: 3 %
Eosinophils Absolute: 198 cells/uL (ref 15–500)
HCT: 44.4 % (ref 38.5–50.0)
HEMOGLOBIN: 14.7 g/dL (ref 13.2–17.1)
LYMPHS ABS: 2376 {cells}/uL (ref 850–3900)
Lymphocytes Relative: 36 %
MCH: 31.5 pg (ref 27.0–33.0)
MCHC: 33.1 g/dL (ref 32.0–36.0)
MCV: 95.1 fL (ref 80.0–100.0)
MONOS PCT: 11 %
MPV: 10.5 fL (ref 7.5–12.5)
Monocytes Absolute: 726 cells/uL (ref 200–950)
NEUTROS ABS: 3300 {cells}/uL (ref 1500–7800)
Neutrophils Relative %: 50 %
PLATELETS: 161 10*3/uL (ref 140–400)
RBC: 4.67 MIL/uL (ref 4.20–5.80)
RDW: 13.2 % (ref 11.0–15.0)
WBC: 6.6 10*3/uL (ref 3.8–10.8)

## 2015-06-25 LAB — TSH: TSH: 1.55 mIU/L (ref 0.40–4.50)

## 2015-06-25 LAB — VITAMIN B12: VITAMIN B 12: 463 pg/mL (ref 200–1100)

## 2015-06-25 LAB — TESTOSTERONE: TESTOSTERONE: 173 ng/dL — AB (ref 250–827)

## 2015-06-25 NOTE — Progress Notes (Signed)
Complete Physical  Assessment and Plan:   1. Routine general medical examination at a health care facility  - CBC with Differential/Platelet - BASIC METABOLIC PANEL WITH GFR - Hepatic function panel - Magnesium  2. Hyperlipidemia -restart crestor 5 mg daily - Lipid panel  3. Screening for diabetes mellitus  - Hemoglobin A1c - Insulin, random  4. Screening for prostate cancer -refilled cialis - PSA  5. Screening for deficiency anemia  - Iron and TIBC - Vitamin B12  6. Testosterone deficiency  - Testosterone  7. Screening for hematuria or proteinuria  - Urinalysis, Routine w reflex microscopic (not at [facility]) - Microalbumin / creatinine urine ratio  8. History of atrial fibrillation  - EKG 12-Lead  9. Vitamin D deficiency  - VITAMIN D 25 Hydroxy (Vit-D Deficiency, Fractures)  10. Screening for thyroid disorder  - TSH  Discussed med's effects and SE's. Screening labs and tests as requested with regular follow-up as recommended.  HPI Patient presents for a complete physical.   His blood pressure has been controlled at home, today their BP is BP: 124/80 mmHg He does workout. He denies chest pain, shortness of breath, dizziness.  He is getting back to golfing and walking a lot.  He is also really working on his diet.  He eats boiled eggs in the morning, eats a lean protein for lunch and then is more liberal with what he eats for dinner.     He is not on cholesterol medication and denies myalgias. His cholesterol is not at goal. The cholesterol last visit was:   Lab Results  Component Value Date   CHOL 287* 11/21/2014   HDL 73 11/21/2014   LDLCALC 180* 11/21/2014   TRIG 169* 11/21/2014   CHOLHDL 3.9 11/21/2014    He has not been working on diet and exercise for prediabetes, he is not on bASA, he is not on ACE/ARB and denies foot ulcerations, hyperglycemia, hypoglycemia , increased appetite, nausea, paresthesia of the feet, polydipsia, polyuria, visual  disturbances, vomiting and weight loss. Last A1C in the office was:  Lab Results  Component Value Date   HGBA1C 5.7* 11/21/2014    Patient is on Vitamin D supplement.   Lab Results  Component Value Date   VD25OH 55 06/25/2014      Last PSA was: Lab Results  Component Value Date   PSA 0.96 06/25/2014  .  Denies BPH symptoms daytime frequency, double voiding, dysuria, hematuria, hesitancy, incontinence, intermittency, nocturia, sensation of incomplete bladder emptying, suprapubic pain, urgency or weak urinary stream.    Current Medications:  Current Outpatient Prescriptions on File Prior to Visit  Medication Sig Dispense Refill  . Cholecalciferol (VITAMIN D PO) Take 10,000 Units by mouth daily.    Marland Kitchen diltiazem (CARDIZEM) 60 MG tablet Take 1 tablet (60 mg total) by mouth 4 (four) times daily as needed. 30 tablet 0  . Multiple Vitamin (MULTIVITAMIN) tablet Take 1 tablet by mouth daily.    . Omega-3 Fatty Acids (FISH OIL) 1000 MG CAPS Take 1,000 mg by mouth daily.    . psyllium (HYDROCIL/METAMUCIL) 95 % PACK Take 1 packet by mouth daily.    . tadalafil (CIALIS) 20 MG tablet Take 1 tablet (20 mg total) by mouth daily as needed for erectile dysfunction. 6 tablet 2  . rosuvastatin (CRESTOR) 10 MG tablet Take 1 tablet (10 mg total) by mouth at bedtime. (Patient not taking: Reported on 06/25/2015) 90 tablet 2   No current facility-administered medications on file prior to visit.  Health Maintenance:  Immunization History  Administered Date(s) Administered  . PPD Test 04/03/2013  . Td 11/08/2005    Tetanus: Today Colonoscopy: 2013 Eye Exam: 2017 Dentist: Twice yearly  Patient Care Team: Unk Pinto, MD as PCP - General (Internal Medicine) Milus Banister, MD as Attending Physician (Gastroenterology) Thornell Sartorius, MD as Consulting Physician (Otolaryngology) Willow Ora (Optometry) Justice Britain, MD as Consulting Physician (Orthopedic Surgery) Newman Pies, MD as  Consulting Physician (Neurosurgery)  Allergies:  Allergies  Allergen Reactions  . Pravastatin     Fatigue    Medical History:  Past Medical History  Diagnosis Date  . Hyperlipidemia   . GERD (gastroesophageal reflux disease)   . Unspecified vitamin D deficiency   . Other testicular hypofunction   . Atrial fibrillation with rapid ventricular response [facility])     Surgical History:  Past Surgical History  Procedure Laterality Date  . Shoulder surgery  2011    Family History:  Family History  Problem Relation Age of Onset  . Colon polyps Father   . Hyperlipidemia Father   . Colon cancer Paternal Uncle   . Lymphoma Mother   . Cancer Mother     lymphoma  . Depression Maternal Grandmother   . Cancer Maternal Grandmother 37    fatal colon  . Heart disease Paternal Grandfather     Social History:   Social History  Substance Use Topics  . Smoking status: Former Smoker    Quit date: 07/29/2006  . Smokeless tobacco: Never Used  . Alcohol Use: 9.0 oz/week    15 Cans of beer per week    Review of Systems:  Review of Systems  Constitutional: Negative for fever, chills and malaise/fatigue.  HENT: Negative for congestion, ear pain and sore throat.   Respiratory: Negative for cough, shortness of breath and wheezing.   Cardiovascular: Negative.   Gastrointestinal: Negative for heartburn, abdominal pain, diarrhea, constipation, blood in stool and melena.  Genitourinary: Negative.   Skin: Negative.   Neurological: Negative for dizziness, sensory change, loss of consciousness and headaches.  Psychiatric/Behavioral: Negative for depression. The patient is not nervous/anxious and does not have insomnia.     Physical Exam: Estimated body mass index is 29.42 kg/(m^2) as calculated from the following:   Height as of this encounter: 6' (1.829 m).   Weight as of this encounter: 217 lb (98.431 kg). BP 124/80 mmHg  Pulse 58  Temp(Src) 98.1 F (36.7 C) (Temporal)  Resp 18  Ht 6'  (1.829 m)  Wt 217 lb (98.431 kg)  BMI 29.42 kg/m2  General Appearance: Well nourished, in no apparent distress.  Eyes: PERRLA, EOMs, conjunctiva no swelling or erythema ENT/Mouth: Ear canals clear bilaterally with no erythema, swelling, discharge.  TMs normal bilaterally with no erythema, bulging, or retractions.  Oropharynx clear and moist with no exudate, swelling, or erythema.  Dentition normal.   Neck: Supple, thyroid normal. No bruits, JVD, cervical adenopathy Respiratory: Respiratory effort normal, BS equal bilaterally without rales, rhonchi, wheezing or stridor.  Cardio: RRR without murmurs, rubs or gallops. Brisk peripheral pulses without edema.  Chest: symmetric, with normal excursions Abdomen: Soft, nontender, no guarding, rebound, hernias, masses, or organomegaly. Musculoskeletal: Full ROM all peripheral extremities,5/5 strength, and normal gait.  Skin: Warm, dry without rashes, lesions, ecchymosis. Neuro: A&Ox3, Cranial nerves intact, reflexes equal bilaterally. Normal muscle tone, no cerebellar symptoms. Sensation intact.  Psych: Normal affect, Insight and Judgment appropriate.   EKG: WNL no changes.  AORTA SCAN: WNL  Over 40 minutes  of exam, counseling, chart review and critical decision making was performed  Starlyn Skeans 3:23 PM [facility]

## 2015-06-25 NOTE — Addendum Note (Signed)
Addended by: Priyana Mccarey A on: 06/25/2015 04:30 PM   Modules accepted: Orders

## 2015-06-26 LAB — HEPATIC FUNCTION PANEL
ALK PHOS: 49 U/L (ref 40–115)
ALT: 27 U/L (ref 9–46)
AST: 22 U/L (ref 10–40)
Albumin: 4.6 g/dL (ref 3.6–5.1)
BILIRUBIN DIRECT: 0.1 mg/dL (ref <=0.2)
BILIRUBIN INDIRECT: 0.5 mg/dL (ref 0.2–1.2)
TOTAL PROTEIN: 7.2 g/dL (ref 6.1–8.1)
Total Bilirubin: 0.6 mg/dL (ref 0.2–1.2)

## 2015-06-26 LAB — LIPID PANEL
CHOL/HDL RATIO: 4 ratio (ref <=5.0)
CHOLESTEROL: 320 mg/dL — AB (ref 125–200)
HDL: 81 mg/dL (ref >=40)
LDL CALC: 189 mg/dL — AB (ref <130)
TRIGLYCERIDES: 252 mg/dL — AB (ref <150)
VLDL: 50 mg/dL — AB (ref <30)

## 2015-06-26 LAB — BASIC METABOLIC PANEL WITH GFR
BUN: 23 mg/dL (ref 7–25)
CALCIUM: 9.7 mg/dL (ref 8.6–10.3)
CO2: 22 mmol/L (ref 20–31)
Chloride: 99 mmol/L (ref 98–110)
Creat: 0.8 mg/dL (ref 0.60–1.35)
GLUCOSE: 94 mg/dL (ref 65–99)
POTASSIUM: 4 mmol/L (ref 3.5–5.3)
SODIUM: 134 mmol/L — AB (ref 135–146)

## 2015-06-26 LAB — IRON AND TIBC
%SAT: 29 % (ref 15–60)
Iron: 100 ug/dL (ref 50–180)
TIBC: 339 ug/dL (ref 250–425)
UIBC: 239 ug/dL (ref 125–400)

## 2015-06-26 LAB — URINALYSIS, ROUTINE W REFLEX MICROSCOPIC
BILIRUBIN URINE: NEGATIVE
GLUCOSE, UA: NEGATIVE
HGB URINE DIPSTICK: NEGATIVE
KETONES UR: NEGATIVE
Leukocytes, UA: NEGATIVE
Nitrite: NEGATIVE
PROTEIN: NEGATIVE
Specific Gravity, Urine: 1.029 (ref 1.001–1.035)
pH: 5 (ref 5.0–8.0)

## 2015-06-26 LAB — HEMOGLOBIN A1C
Hgb A1c MFr Bld: 5.3 % (ref <5.7)
Mean Plasma Glucose: 105 mg/dL

## 2015-06-26 LAB — VITAMIN D 25 HYDROXY (VIT D DEFICIENCY, FRACTURES): VIT D 25 HYDROXY: 54 ng/mL (ref 30–100)

## 2015-06-26 LAB — MICROALBUMIN / CREATININE URINE RATIO
CREATININE, URINE: 159 mg/dL (ref 20–370)
Microalb Creat Ratio: 5 mcg/mg creat (ref <30)
Microalb, Ur: 0.8 mg/dL

## 2015-06-26 LAB — PSA: PSA: 0.93 ng/mL (ref <=4.00)

## 2015-06-26 LAB — INSULIN, RANDOM: Insulin: 3.1 u[IU]/mL (ref 2.0–19.6)

## 2015-06-26 LAB — MAGNESIUM: Magnesium: 2.1 mg/dL (ref 1.5–2.5)

## 2015-06-27 ENCOUNTER — Encounter: Payer: Self-pay | Admitting: Internal Medicine

## 2015-09-25 ENCOUNTER — Ambulatory Visit: Payer: Self-pay | Admitting: Internal Medicine

## 2015-11-20 ENCOUNTER — Other Ambulatory Visit: Payer: Self-pay | Admitting: Internal Medicine

## 2015-12-30 ENCOUNTER — Ambulatory Visit (INDEPENDENT_AMBULATORY_CARE_PROVIDER_SITE_OTHER): Payer: Commercial Managed Care - PPO | Admitting: Family Medicine

## 2015-12-30 VITALS — BP 118/82 | HR 92 | Temp 97.7°F | Resp 18 | Ht 72.0 in | Wt 208.0 lb

## 2015-12-30 DIAGNOSIS — J069 Acute upper respiratory infection, unspecified: Secondary | ICD-10-CM | POA: Diagnosis not present

## 2015-12-30 MED ORDER — IPRATROPIUM BROMIDE 0.03 % NA SOLN: 2.0000 | Freq: Two times a day (BID) | NASAL | 0 refills | Status: DC

## 2015-12-30 MED ORDER — AZITHROMYCIN 250 MG PO TABS: ORAL_TABLET | ORAL | 0 refills | Status: DC

## 2015-12-30 MED ORDER — HYDROCOD POLST-CPM POLST ER 10-8 MG/5ML PO SUER: 5.0000 mL | Freq: Two times a day (BID) | ORAL | 0 refills | Status: DC | PRN

## 2015-12-30 NOTE — Patient Instructions (Addendum)
Return for care if symptoms worsen.  If no improvement by Thursday, ok to fill antibiotic and take as directed.  IF you received an x-ray today, you will receive an invoice from [facility]. Please contact [facility] at [phone] with questions or concerns regarding your invoice.   IF you received labwork today, you will receive an invoice from [facility]. Please contact [facility] at [phone] with questions or concerns regarding your invoice.   Our billing staff will not be able to assist you with questions regarding bills from these companies.  You will be contacted with the lab results as soon as they are available. The fastest way to get your results is to activate your My Chart account. Instructions are located on the last page of this paperwork. If you have not heard from [place] regarding the results in 2 weeks, please contact this office.     Upper Respiratory Infection, Adult Most upper respiratory infections (URIs) are caused by a virus. A URI affects the nose, throat, and upper air passages. The most common type of URI is often called "the common cold." Follow these instructions at home:  Take medicines only as told by your doctor.  Gargle warm saltwater or take cough drops to comfort your throat as told by your doctor.  Use a warm mist humidifier or inhale steam from a shower to increase air moisture. This may make it easier to breathe.  Drink enough fluid to keep your pee (urine) clear or pale yellow.  Eat soups and other clear broths.  Have a healthy diet.  Rest as needed.  Go back to work when your fever is gone or your doctor says it is okay.  You may need to stay home longer to avoid giving your URI to others.  You can also wear a face mask and wash your hands often to prevent spread of the virus.  Use your inhaler more if you have asthma.  Do not use any tobacco products, including cigarettes, chewing tobacco, or electronic cigarettes. If you  need help quitting, ask your doctor. Contact a doctor if:  You are getting worse, not better.  Your symptoms are not helped by medicine.  You have chills.  You are getting more short of breath.  You have brown or red mucus.  You have yellow or brown discharge from your nose.  You have pain in your face, especially when you bend forward.  You have a fever.  You have puffy (swollen) neck glands.  You have pain while swallowing.  You have white areas in the back of your throat. Get help right away if:  You have very bad or constant:  Headache.  Ear pain.  Pain in your forehead, behind your eyes, and over your cheekbones (sinus pain).  Chest pain.  You have long-lasting (chronic) lung disease and any of the following:  Wheezing.  Long-lasting cough.  Coughing up blood.  A change in your usual mucus.  You have a stiff neck.  You have changes in your:  Vision.  Hearing.  Thinking.  Mood. This information is not intended to replace advice given to you by your health care provider. Make sure you discuss any questions you have with your health care provider. Document Released: 06/09/2007 Document Revised: 08/24/2015 Document Reviewed: 03/28/2013 Elsevier Interactive Patient Education  2017 [employer].

## 2015-12-30 NOTE — Progress Notes (Signed)
Patient ID: Meikhi Floro, male    DOB: [birth year], 45 y.o.   MRN: [id number]  PCP: Alesia Richards, MD  Chief Complaint  Patient presents with  . Cough  . Nasal Congestion    Subjective:  HPI  Michael Kim, 45 year old male presents for evaluation of cough and nasal congestion x 4 days. Reports a headache (bilateral), nasal drainage and congestion, sore throat. No fever. Cough is keeping him awake remains non-productive. Hx atrial fibrillation. He has take Advil decongestant which ultimately kept him awake all night. He also request a note excusing him from work.  Social History   Social History  . Marital status: Married    Spouse name: N/A  . Number of children: N/A  . Years of education: N/A   Occupational History  . Golf [employer]    [place]   Social History Main Topics  . Smoking status: Former Smoker    Quit date: 07/29/2006  . Smokeless tobacco: Never Used  . Alcohol use 9.0 oz/week    15 Cans of beer per week  . Drug use: No  . Sexual activity: Yes   Other Topics Concern  . 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 Colon polyps Father   . Hyperlipidemia Father   . Lymphoma Mother   . Cancer Mother     lymphoma  . Colon cancer Paternal Uncle   . Depression Maternal Grandmother   . Cancer Maternal Grandmother 43    fatal colon  . Heart disease Paternal Grandfather    Review of Systems  HPI  Patient Active Problem List   Diagnosis Date Noted  . Prediabetes 01/24/2014  . Elevated BP 10/23/2013  . Elevated glucose 10/23/2013  . Medication management 10/23/2013  . Atrial fibrillation with rapid ventricular response ([place]) 05/07/2013  . Mixed hyperlipidemia 04/09/2013  . Vitamin D deficiency   . Testosterone deficiency   . Rectal bleeding 03/09/2011    Allergies  Allergen Reactions  . Pravastatin     Fatigue    Prior to Admission  medications   Medication Sig Start Date End Date Taking? Authorizing Provider  Cholecalciferol (VITAMIN D PO) Take 10,000 Units by mouth daily.   Yes Historical Provider, MD  CIALIS 20 MG tablet TAKE 1 TABLET DAILY AS NEEDED FOR ERECTILE DYSFUNCTION 11/20/15  Yes Unk Pinto, MD  Magnesium 250 MG TABS Take 250 mg by mouth daily.   Yes Historical Provider, MD  Multiple Vitamin (MULTIVITAMIN) tablet Take 1 tablet by mouth daily.   Yes Historical Provider, MD  Omega-3 Fatty Acids (FISH OIL) 1000 MG CAPS Take 1,000 mg by mouth daily.   Yes Historical Provider, MD  psyllium (HYDROCIL/METAMUCIL) 95 % PACK Take 1 packet by mouth daily.   Yes Historical Provider, MD  diltiazem (CARDIZEM) 60 MG tablet Take 1 tablet (60 mg total) by mouth 4 (four) times daily as needed. Patient not taking: Reported on 12/30/2015 03/10/15   Thayer Headings, MD    Past Medical, Surgical Family and Social History reviewed and updated.    Objective:   Today's Vitals   12/30/15 1011  BP: 118/82  Pulse: 92  Resp: 18  Temp: 97.7 F (36.5 C)  TempSrc: Oral  SpO2: 97%  Weight: 208 lb (94.3 kg)  Height: 6' (1.829 m)    Wt Readings from Last 3 Encounters:  12/30/15  208 lb (94.3 kg)  06/25/15 217 lb (98.4 kg)  11/21/14 216 lb (98 kg)   Physical Exam  Constitutional: He is oriented to person, place, and time. He appears well-developed and well-nourished.  HENT:  Right Ear: Hearing, tympanic membrane, external ear and ear canal normal.  Left Ear: Hearing, tympanic membrane, external ear and ear canal normal.  Nose: Mucosal edema and rhinorrhea present.  Mouth/Throat: Uvula is midline. No oropharyngeal exudate, posterior oropharyngeal edema, posterior oropharyngeal erythema or tonsillar abscesses.  Neck: Normal range of motion. Neck supple.  Cardiovascular: Normal rate, regular rhythm, normal heart sounds and intact distal pulses.   Pulmonary/Chest: Effort normal and breath sounds normal.  Neurological: He is  alert and oriented to person, place, and time.  Skin: Skin is warm and dry.  Psychiatric: He has a normal mood and affect. His behavior is normal. Judgment and thought content normal.      Assessment & Plan:  1. Acute upper respiratory infection -Start Azithromycin Take 2 tabs x 1 dose, then 1 tab every day for x 4 days - Ipratropium (Atrovent) 2 sprays, twice daily. -For cough, chlorpheniramine-hydrocodone (Tussionex) 5 ml every 12 hours. Medication causes drowsiness and or sedation.   Carroll Sage. Kenton Kingfisher, MSN, FNP-C [facility]

## 2016-01-29 ENCOUNTER — Other Ambulatory Visit: Payer: Self-pay | Admitting: Internal Medicine

## 2016-02-05 HISTORY — PX: COLONOSCOPY: SHX174

## 2016-02-09 ENCOUNTER — Encounter: Payer: Self-pay | Admitting: Gastroenterology

## 2016-02-10 ENCOUNTER — Ambulatory Visit (INDEPENDENT_AMBULATORY_CARE_PROVIDER_SITE_OTHER): Payer: Commercial Managed Care - PPO | Admitting: Internal Medicine

## 2016-02-10 ENCOUNTER — Encounter: Payer: Self-pay | Admitting: Internal Medicine

## 2016-02-10 VITALS — BP 122/60 | HR 62 | Temp 98.2°F | Resp 16 | Ht 72.0 in | Wt 214.0 lb

## 2016-02-10 DIAGNOSIS — M545 Low back pain, unspecified: Secondary | ICD-10-CM

## 2016-02-10 DIAGNOSIS — I480 Paroxysmal atrial fibrillation: Secondary | ICD-10-CM

## 2016-02-10 DIAGNOSIS — E782 Mixed hyperlipidemia: Secondary | ICD-10-CM

## 2016-02-10 DIAGNOSIS — Z79899 Other long term (current) drug therapy: Secondary | ICD-10-CM

## 2016-02-10 DIAGNOSIS — R7303 Prediabetes: Secondary | ICD-10-CM | POA: Diagnosis not present

## 2016-02-10 LAB — CBC WITH DIFFERENTIAL/PLATELET
BASOS ABS: 0 {cells}/uL (ref 0–200)
Basophils Relative: 0 %
EOS PCT: 3 %
Eosinophils Absolute: 147 cells/uL (ref 15–500)
HCT: 43.4 % (ref 38.5–50.0)
Hemoglobin: 14.8 g/dL (ref 13.2–17.1)
LYMPHS PCT: 37 %
Lymphs Abs: 1813 cells/uL (ref 850–3900)
MCH: 32 pg (ref 27.0–33.0)
MCHC: 34.1 g/dL (ref 32.0–36.0)
MCV: 93.9 fL (ref 80.0–100.0)
MONOS PCT: 11 %
MPV: 10.4 fL (ref 7.5–12.5)
Monocytes Absolute: 539 cells/uL (ref 200–950)
NEUTROS ABS: 2401 {cells}/uL (ref 1500–7800)
NEUTROS PCT: 49 %
PLATELETS: 153 10*3/uL (ref 140–400)
RBC: 4.62 MIL/uL (ref 4.20–5.80)
RDW: 13.1 % (ref 11.0–15.0)
WBC: 4.9 10*3/uL (ref 3.8–10.8)

## 2016-02-10 LAB — HEPATIC FUNCTION PANEL
ALBUMIN: 4.3 g/dL (ref 3.6–5.1)
ALT: 28 U/L (ref 9–46)
AST: 25 U/L (ref 10–40)
Alkaline Phosphatase: 44 U/L (ref 40–115)
BILIRUBIN DIRECT: 0.1 mg/dL (ref <=0.2)
BILIRUBIN TOTAL: 0.8 mg/dL (ref 0.2–1.2)
Indirect Bilirubin: 0.7 mg/dL (ref 0.2–1.2)
Total Protein: 6.7 g/dL (ref 6.1–8.1)

## 2016-02-10 LAB — TSH: TSH: 1.39 m[IU]/L (ref 0.40–4.50)

## 2016-02-10 LAB — LIPID PANEL
CHOL/HDL RATIO: 2.7 ratio (ref <5.0)
Cholesterol: 231 mg/dL — ABNORMAL HIGH (ref <200)
HDL: 87 mg/dL (ref >40)
LDL Cholesterol: 87 mg/dL (ref <100)
TRIGLYCERIDES: 284 mg/dL — AB (ref <150)
VLDL: 57 mg/dL — AB (ref <30)

## 2016-02-10 LAB — BASIC METABOLIC PANEL WITH GFR
BUN: 10 mg/dL (ref 7–25)
CALCIUM: 9.5 mg/dL (ref 8.6–10.3)
CO2: 27 mmol/L (ref 20–31)
CREATININE: 0.78 mg/dL (ref 0.60–1.35)
Chloride: 103 mmol/L (ref 98–110)
GFR, Est African American: >89 mL/min (ref >=60)
GFR, Est Non African American: >89 mL/min (ref >=60)
Glucose, Bld: 101 mg/dL — ABNORMAL HIGH (ref 65–99)
Potassium: 4.3 mmol/L (ref 3.5–5.3)
SODIUM: 137 mmol/L (ref 135–146)

## 2016-02-10 LAB — HEMOGLOBIN A1C
Hgb A1c MFr Bld: 5 % (ref <5.7)
Mean Plasma Glucose: 97 mg/dL

## 2016-02-10 NOTE — Progress Notes (Signed)
Assessment and Plan:  Hypertension:  -Continue medication,  -monitor blood pressure at home.  -Continue DASH diet.   -Reminder to go to the ER if any CP, SOB, nausea, dizziness, severe HA, changes vision/speech, left arm numbness and tingling, and jaw pain.  Cholesterol: -Continue diet and exercise.  -Check cholesterol.   Pre-diabetes: -Continue diet and exercise.  -Check A1C  Vitamin D Def: -continue medications.   Hip and back pain -offered daily antiinflammatory medication -try gentle stretching routine and heat as needed -will let me now if he changes his mind about medication  Continue diet and meds as discussed. Further disposition pending results of labs.  HPI 46 y.o. male  presents for 3 month follow up with hypertension, hyperlipidemia, prediabetes and vitamin D.   His blood pressure has been controlled at home, today their BP is BP: 122/60.   He does workout. He denies chest pain, shortness of breath, dizziness.  He is still working as a TEFL teacher and is getting his own golf games in multiple times per week.  He reports that he is also trying to work in some cardiovascular activity when he has the opportunity. No recent issues with palpitations or issus with atrial fibrillation lately.  He feels like this was at one time linked with alcohol use.     He is on cholesterol medication and denies myalgias. His cholesterol is not at goal. The cholesterol last visit was:   Lab Results  Component Value Date   CHOL 320 (H) 06/25/2015   HDL 81 06/25/2015   LDLCALC 189 (H) 06/25/2015   TRIG 252 (H) 06/25/2015   CHOLHDL 4.0 06/25/2015  He is currently on 5 mg of his medication daily right now.  He has no side effects.  He is tolerating medication well.     He has been working on diet and exercise for prediabetes, and denies foot ulcerations, hyperglycemia, hypoglycemia , increased appetite, nausea, paresthesia of the feet, polydipsia, polyuria, visual disturbances, vomiting and  weight loss. Last A1C in the office was:  Lab Results  Component Value Date   HGBA1C 5.3 06/25/2015    Patient is on Vitamin D supplement.  Lab Results  Component Value Date   VD25OH 39 06/25/2015      He does admit that occasionally he is having low back pain and also having some hip pain.  It is few and far between.  He is not doing any stretching currently.  He has no prior injury.   Current Medications:  Current Outpatient Prescriptions on File Prior to Visit  Medication Sig Dispense Refill  . Cholecalciferol (VITAMIN D PO) Take 10,000 Units by mouth daily.    Marland Kitchen CIALIS 20 MG tablet TAKE 1 TABLET DAILY AS NEEDED FOR ERECTILE DYSFUNCTION 6 tablet 99  . diltiazem (CARDIZEM) 60 MG tablet Take 1 tablet (60 mg total) by mouth 4 (four) times daily as needed. 30 tablet 0  . Magnesium 250 MG TABS Take 250 mg by mouth daily.    . Multiple Vitamin (MULTIVITAMIN) tablet Take 1 tablet by mouth daily.    . Omega-3 Fatty Acids (FISH OIL) 1000 MG CAPS Take 1,000 mg by mouth daily.    . psyllium (HYDROCIL/METAMUCIL) 95 % PACK Take 1 packet by mouth daily.    . rosuvastatin (CRESTOR) 10 MG tablet TAKE 1 TABLET BY MOUTH AT BEDTIME 15 tablet 0   No current facility-administered medications on file prior to visit.     Medical History:  Past Medical History:  Diagnosis Date  . Atrial fibrillation with rapid ventricular response ([place])   . GERD (gastroesophageal reflux disease)   . Hyperlipidemia   . Other testicular hypofunction   . Unspecified vitamin D deficiency     Allergies:  Allergies  Allergen Reactions  . Pravastatin     Fatigue     Review of Systems:  Review of Systems  Constitutional: Negative for chills, fever and malaise/fatigue.  HENT: Negative for congestion, ear pain and sore throat.   Eyes: Negative.   Respiratory: Negative for cough, shortness of breath and wheezing.   Cardiovascular: Negative for chest pain, palpitations and leg swelling.  Gastrointestinal:  Negative for abdominal pain, blood in stool, constipation, diarrhea, heartburn and melena.  Genitourinary: Negative.   Skin: Negative.   Neurological: Negative for dizziness, sensory change, loss of consciousness and headaches.  Psychiatric/Behavioral: Negative for depression. The patient is not nervous/anxious and does not have insomnia.     Family history- Review and unchanged  Social history- Review and unchanged  Physical Exam: BP 122/60   Pulse 62   Temp 98.2 F (36.8 C) (Temporal)   Resp 16   Ht 6' (1.829 m)   Wt 214 lb (97.1 kg)   BMI 29.02 kg/m  Wt Readings from Last 3 Encounters:  02/10/16 214 lb (97.1 kg)  12/30/15 208 lb (94.3 kg)  06/25/15 217 lb (98.4 kg)    General Appearance: Well nourished well developed, in no apparent distress. Eyes: PERRLA, EOMs, conjunctiva no swelling or erythema ENT/Mouth: Ear canals normal without obstruction, swelling, erythma, discharge.  TMs normal bilaterally.  Oropharynx moist, clear, without exudate, or postoropharyngeal swelling. Neck: Supple, thyroid normal,no cervical adenopathy  Respiratory: Respiratory effort normal, Breath sounds clear A&P without rhonchi, wheeze, or rale.  No retractions, no accessory usage. Cardio: RRR with no MRGs. Brisk peripheral pulses without edema.  Abdomen: Soft, + BS,  Non tender, no guarding, rebound, hernias, masses. Musculoskeletal: Full ROM, 5/5 strength, Normal gait Skin: Warm, dry without rashes, lesions, ecchymosis.  Neuro: Awake and oriented X 3, Cranial nerves intact. Normal muscle tone, no cerebellar symptoms. Psych: Normal affect, Insight and Judgment appropriate.    Starlyn Skeans, PA-C 9:44 AM [facility]

## 2016-03-20 ENCOUNTER — Other Ambulatory Visit: Payer: Self-pay | Admitting: Internal Medicine

## 2016-05-18 ENCOUNTER — Encounter: Payer: Self-pay | Admitting: *Deleted

## 2016-05-27 ENCOUNTER — Encounter: Payer: Self-pay | Admitting: *Deleted

## 2016-06-19 ENCOUNTER — Encounter (HOSPITAL_COMMUNITY): Payer: Self-pay | Admitting: Nurse Practitioner

## 2016-06-19 ENCOUNTER — Emergency Department (HOSPITAL_COMMUNITY): Payer: Commercial Managed Care - PPO

## 2016-06-19 ENCOUNTER — Emergency Department (HOSPITAL_COMMUNITY)
Admission: EM | Admit: 2016-06-19 | Discharge: 2016-06-19 | Disposition: A | Discharge status: Home / Self Care | Payer: Commercial Managed Care - PPO | Attending: Emergency Medicine | Admitting: Emergency Medicine

## 2016-06-19 DIAGNOSIS — Z87891 Personal history of nicotine dependence: Secondary | ICD-10-CM | POA: Insufficient documentation

## 2016-06-19 DIAGNOSIS — Z79899 Other long term (current) drug therapy: Secondary | ICD-10-CM | POA: Insufficient documentation

## 2016-06-19 DIAGNOSIS — I480 Paroxysmal atrial fibrillation: Secondary | ICD-10-CM | POA: Insufficient documentation

## 2016-06-19 DIAGNOSIS — R002 Palpitations: Secondary | ICD-10-CM | POA: Diagnosis present

## 2016-06-19 LAB — CBC WITH DIFFERENTIAL/PLATELET
Basophils Absolute: 0 K/uL (ref 0.0–0.1)
Basophils Relative: 0 %
Eosinophils Absolute: 0.1 K/uL (ref 0.0–0.7)
Eosinophils Relative: 2 %
HCT: 43.5 % (ref 39.0–52.0)
Hemoglobin: 15.3 g/dL (ref 13.0–17.0)
Lymphocytes Relative: 39 %
Lymphs Abs: 2 K/uL (ref 0.7–4.0)
MCH: 32.1 pg (ref 26.0–34.0)
MCHC: 35.2 g/dL (ref 30.0–36.0)
MCV: 91.4 fL (ref 78.0–100.0)
Monocytes Absolute: 0.6 K/uL (ref 0.1–1.0)
Monocytes Relative: 12 %
Neutro Abs: 2.4 K/uL (ref 1.7–7.7)
Neutrophils Relative %: 47 %
Platelets: 131 K/uL — ABNORMAL LOW (ref 150–400)
RBC: 4.76 MIL/uL (ref 4.22–5.81)
RDW: 12.2 % (ref 11.5–15.5)
WBC: 5.1 K/uL (ref 4.0–10.5)

## 2016-06-19 LAB — I-STAT TROPONIN, ED: Troponin i, poc: 0 ng/mL (ref 0.00–0.08)

## 2016-06-19 LAB — BASIC METABOLIC PANEL
Anion gap: 8 (ref 5–15)
BUN: 17 mg/dL (ref 6–20)
CHLORIDE: 105 mmol/L (ref 101–111)
CO2: 25 mmol/L (ref 22–32)
CREATININE: 0.65 mg/dL (ref 0.61–1.24)
Calcium: 9.5 mg/dL (ref 8.9–10.3)
GFR calc Af Amer: >60 mL/min (ref >60)
GFR calc non Af Amer: >60 mL/min (ref >60)
GLUCOSE: 108 mg/dL — AB (ref 65–99)
Potassium: 4.1 mmol/L (ref 3.5–5.1)
SODIUM: 138 mmol/L (ref 135–145)

## 2016-06-19 MED ORDER — APIXABAN 5 MG PO TABS: 5.0000 mg | ORAL_TABLET | Freq: Two times a day (BID) | ORAL | 0 refills | Status: DC

## 2016-06-19 MED ORDER — DILTIAZEM HCL ER COATED BEADS 180 MG PO CP24: 180.0000 mg | ORAL_CAPSULE | Freq: Every day | ORAL | 0 refills | Status: DC

## 2016-06-19 MED ORDER — SODIUM CHLORIDE 0.9 % IV BOLUS (SEPSIS)
1000.0000 mL | Freq: Once | INTRAVENOUS | Status: AC
  Administered 2016-06-19: 1000 mL via INTRAVENOUS

## 2016-06-19 NOTE — Discharge Instructions (Signed)
Start taking Cardizem as prescribed daily. Take Eliquis as prescribed while in afib. Do not take ibuprofen, Aleve, naproxen, aspirin when taking this medication. Please follow-up with cardiology this week for recheck. Return if heart rate is elevated, chest pain, shortness of breath, any new concerning symptoms.

## 2016-06-19 NOTE — ED Provider Notes (Signed)
[facility] DEPT Provider Note   CSN: [id number] Arrival date & time: 06/19/16  3220     History   Chief Complaint Chief Complaint  Patient presents with  . Irregular Heart Beat    HPI Michael Kim is a 46 y.o. male.  HPI Michael Kim is a 46 y.o. male with hx of hyperlipidemia, hx of afib 3 years ago, presents to ED with complaint of palpitations. States symptoms started yesterday when he jumped into his pool. Reports feeling of "heart beating like a bird against my chest." states took his HR with a monitor and it was 109 yesterday. He took one cardizem and then one more when had no relief. States wanted to wait it out until the morning, but this morning when he was still feeling palpitations, decided to come to ED. Patient denies any chest pain. He admits to drinking more than usual amount of ice teas recently, denies any other caffeine, drugs, alcohol. He states he has not had any issues with A. fib since last time when he was here, 3 years ago, at that time converted spontaneously. Does not take cardizem daily, only as needed. Not anticoagulated. No other complaints.  Past Medical History:  Diagnosis Date  . Atrial fibrillation with rapid ventricular response ([place])   . GERD (gastroesophageal reflux disease)   . Hyperlipidemia   . Other testicular hypofunction   . Unspecified vitamin D deficiency     Patient Active Problem List   Diagnosis Date Noted  . Prediabetes 01/24/2014  . Elevated BP 10/23/2013  . Elevated glucose 10/23/2013  . Medication management 10/23/2013  . Atrial fibrillation with rapid ventricular response ([place]) 05/07/2013  . Mixed hyperlipidemia 04/09/2013  . Vitamin D deficiency   . Testosterone deficiency   . Rectal bleeding 03/09/2011    Past Surgical History:  Procedure Laterality Date  . SHOULDER SURGERY  2011       Home Medications    Prior to Admission medications   Medication Sig Start Date End Date Taking? Authorizing Provider    Cholecalciferol (VITAMIN D PO) Take 10,000 Units by mouth daily.    [provider]  CIALIS 20 MG tablet TAKE 1 TABLET DAILY AS NEEDED FOR ERECTILE DYSFUNCTION 11/20/15   Unk Pinto, MD  diltiazem (CARDIZEM) 60 MG tablet Take 1 tablet (60 mg total) by mouth 4 (four) times daily as needed. 03/10/15   Nahser, Wonda Cheng, MD  Magnesium 250 MG TABS Take 250 mg by mouth daily.    [provider]  Multiple Vitamin (MULTIVITAMIN) tablet Take 1 tablet by mouth daily.    [provider]  Omega-3 Fatty Acids (FISH OIL) 1000 MG CAPS Take 1,000 mg by mouth daily.    [provider]  psyllium (HYDROCIL/METAMUCIL) 95 % PACK Take 1 packet by mouth daily.    [provider]  rosuvastatin (CRESTOR) 10 MG tablet Take 1/2 to 1 tablet daily as directed for Cholesterol 03/20/16 06/20/16  Unk Pinto, MD    Family History Family History  Problem Relation Age of Onset  . Colon polyps Father   . Hyperlipidemia Father   . Lymphoma Mother   . Cancer Mother        lymphoma  . Colon cancer Paternal Uncle   . Depression Maternal Grandmother   . Cancer Maternal Grandmother 67       fatal colon  . Heart disease Paternal Grandfather     Social History Social History  Substance Use Topics  . Smoking status: Former Smoker  Quit date: 07/29/2006  . Smokeless tobacco: Never Used  . Alcohol use 9.0 oz/week    15 Cans of beer per week     Allergies   Pravastatin   Review of Systems Review of Systems  Constitutional: Negative for chills and fever.  Respiratory: Negative for cough, chest tightness and shortness of breath.   Cardiovascular: Positive for palpitations. Negative for chest pain and leg swelling.  Gastrointestinal: Negative for abdominal distention, abdominal pain, diarrhea, nausea and vomiting.  Genitourinary: Negative for dysuria, frequency, hematuria and urgency.  Musculoskeletal: Negative for arthralgias, myalgias, neck pain and neck  stiffness.  Skin: Negative for rash.  Allergic/Immunologic: Negative for immunocompromised state.  Neurological: Negative for dizziness, weakness, light-headedness, numbness and headaches.  All other systems reviewed and are negative.    Physical Exam Updated Vital Signs BP 127/88 (BP Location: Right Arm) Comment: Simultaneous filing. User may not have seen previous data.  Pulse 81 Comment: Simultaneous filing. User may not have seen previous data.  Temp 98.1 F (36.7 C) (Oral)   Resp 14 Comment: Simultaneous filing. User may not have seen previous data.  SpO2 97% Comment: Simultaneous filing. User may not have seen previous data.  Physical Exam  Constitutional: He appears well-developed and well-nourished. No distress.  HENT:  Head: Normocephalic and atraumatic.  Eyes: Conjunctivae are normal.  Neck: Neck supple.  Cardiovascular: Normal rate and normal heart sounds.   Irregular rhythm  Pulmonary/Chest: Effort normal. No respiratory distress. He has no wheezes. He has no rales. He exhibits no tenderness.  Abdominal: Soft. Bowel sounds are normal. He exhibits no distension. There is no tenderness. There is no rebound.  Musculoskeletal: He exhibits no edema.  Neurological: He is alert.  Skin: Skin is warm and dry.  Nursing note and vitals reviewed.    ED Treatments / Results  Labs (all labs ordered are listed, but only abnormal results are displayed) Labs Reviewed  CBC WITH DIFFERENTIAL/PLATELET - Abnormal; Notable for the following:       Result Value   Platelets 131 (*)    All other components within normal limits  BASIC METABOLIC PANEL - Abnormal; Notable for the following:    Glucose, Bld 108 (*)    All other components within normal limits  I-STAT TROPOININ, ED    EKG  EKG Interpretation  Date/Time:  Saturday June 19 2016 09:12:28 EDT Ventricular Rate:  88 PR Interval:    QRS Duration: 81 QT Interval:  368 QTC Calculation: 446 R Axis:   58 Text  Interpretation:  Atrial fibrillation since last tracing no significant change Confirmed by Daleen Bo [phone]) on 06/19/2016 9:18:24 AM       Radiology Dg Chest 2 View  Result Date: 06/19/2016 CLINICAL DATA:  Irregular since yesterday when jumped in pool, states prev hx of 1 episode of a-fib couple yrs ago, nonsmoker, no other chest complaints EXAM: CHEST  2 VIEW COMPARISON:  05/07/2013 FINDINGS: The heart size and mediastinal contours are within normal limits. Both lungs are clear. The visualized skeletal structures are unremarkable. IMPRESSION: No active cardiopulmonary disease. Electronically Signed   By: Nolon Nations M.D.   On: 06/19/2016 10:03    Procedures Procedures (including critical care time)  Medications Ordered in ED Medications  sodium chloride 0.9 % bolus 1,000 mL (not administered)     Initial Impression / Assessment and Plan / ED Course  I have reviewed the triage vital signs and the nursing notes.  Pertinent labs & imaging results that were available during  my care of the patient were reviewed by me and considered in my medical decision making (see chart for details).    Pt in ed with recurrent afib. Rate controlled at this time. Will check labs and electrolytes. Will administer 1L of fluids and monitor.  11:24 AM Labs, x-ray all unremarkable. Patient received 1 L of normal saline, continues to be in A. fib, rate controlled. Patient was offered cardioversion, however he is a little hesitant of this procedure. We discussed with him benefits and risks and he wanted to see what his other options were. I spoke with Dr. Lovena Le with cardiology who advised to start patient on Cardizem by mouth 180 mg a day in Eliquis 5 mg twice a day. He believes the patient will probably spontaneously convert in the next several days. He advised him follow-up in the office next week. I discussed that this treatment plan with patient who is agreeable. I will give a prescription for  Cardizem and Eliquis. Strict return precautions discussed.  Vitals:   06/19/16 0912 06/19/16 1100  BP: 127/88 121/82  Pulse: 81 82  Resp: 14 12  Temp: 98.1 F (36.7 C)   TempSrc: Oral   SpO2: 97% 99%     Final Clinical Impressions(s) / ED Diagnoses   Final diagnoses:  Paroxysmal atrial fibrillation [facility])    New Prescriptions Discharge Medication List as of 06/19/2016 11:29 AM    START taking these medications   Details  apixaban (ELIQUIS) 5 MG TABS tablet Take 1 tablet (5 mg total) by mouth 2 (two) times daily., Starting Sat 06/19/2016, Print    diltiazem (CARDIZEM CD) 180 MG 24 hr capsule Take 1 capsule (180 mg total) by mouth daily., Starting Sat 06/19/2016, Print         Marc Morgans [place], PA-C 06/19/16 1232    Daleen Bo, MD 06/19/16 [phone]

## 2016-06-19 NOTE — ED Triage Notes (Signed)
Pt presents to [facility]ED via POV accompnaied by significant other for complaints of irregular heart beat that began last night. He took cardizem which he had on hand but his symptoms did not resolve. He was admitted in 2015 for a fib with rvr but believes he has been in normal rhythm since that time. He does not believe he takes rate controlling medications regularly. Most recent physical with PCP included ekg and showed NSR per patient. Denies chest pain.

## 2016-06-19 NOTE — ED Notes (Signed)
Pt ambulatory, discharge instructions reviewed. Followup reviewed.

## 2016-06-23 ENCOUNTER — Ambulatory Visit: Payer: Commercial Managed Care - PPO | Admitting: Physician Assistant

## 2016-06-23 NOTE — Progress Notes (Signed)
Cardiology Office Note    Date:  06/24/2016   ID:  Michael Kim, DOB [birth year], MRN [id number]  PCP:  Unk Pinto, MD  Cardiologist:  Dr. Acie Fredrickson (last seen 06/04/13)  Chief Complaint: Reestablish care for recurrent atrial fibrillation  History of Present Illness:   Michael Kim is a 46 y.o. male with hx of PAF, GERD and HLD here for reestablish cardiac care for recurrent afib. He was referred by ER physician Dr. Marc Morgans.   He was admitted for afib RVR 05/2013. He spontaneously converted to sinus rhythm on cardizem. His CHADs2VASc score was 0 so anticoagulation is not indicated. Given PRN cardizem. Echo showed normal LVEF.   Recently seen in ER 06/19/16 for palpitation after no improvement on PRN cardizem. EKG showed rate controlled afib. He drinks large amount of ice tea lately. Offered cardioversion however he was hesitant. After discussion with Dr. Lovena Le started on Cardizem CD 180mg  qd and Elquis 5mg  BID. Electrolytes were normal.   Patient is here for further evaluation. He converted to sinus rhythm in few hours after discharged from ER. He only took Eliquis for 2 days. He did not get free 30 days supplies. His copay is $250. Only thing he did differently in past few weeks is to drink lots of iced tea in addition to drink 5 cups of drinking coffee/week. The patient denies nausea, vomiting, fever, chest pain, palpitations, shortness of breath, orthopnea, PND, dizziness, syncope, cough, congestion, abdominal pain, hematochezia, melena, lower extremity edema. He does not exercise regularly. However, he walks between 12K-25K steps every day and plays golf. He goes in home pool daily and plays with dog, no swimming. No recent illness.   Past Medical History:  Diagnosis Date  . Atrial fibrillation with rapid ventricular response ([place])   . GERD (gastroesophageal reflux disease)   . Hyperlipidemia   . Other testicular hypofunction   . Unspecified vitamin D deficiency     Past  Surgical History:  Procedure Laterality Date  . SHOULDER SURGERY  2011    Current Medications:  Prior to Admission medications   Medication Sig Start Date End Date Taking? Authorizing Provider  apixaban (ELIQUIS) 5 MG TABS tablet Take 1 tablet (5 mg total) by mouth 2 (two) times daily. 06/19/16  Yes Kirichenko, Tatyana, PA-C  aspirin EC 81 MG tablet Take 81 mg by mouth daily.   Yes [provider]  Cholecalciferol (VITAMIN D PO) Take 10,000 Units by mouth daily.   Yes [provider]  CIALIS 20 MG tablet TAKE 1 TABLET DAILY AS NEEDED FOR ERECTILE DYSFUNCTION 11/20/15  Yes Unk Pinto, MD  diltiazem (CARDIZEM CD) 180 MG 24 hr capsule Take 1 capsule (180 mg total) by mouth daily. 06/19/16  Yes Kirichenko, Tatyana, PA-C  diltiazem (CARDIZEM) 60 MG tablet Take 1 tablet (60 mg total) by mouth 4 (four) times daily as needed. 03/10/15  Yes Nahser, Wonda Cheng, MD  ibuprofen (ADVIL,MOTRIN) 200 MG tablet Take 400 mg by mouth every 6 (six) hours as needed (pain).   Yes [provider]  Magnesium 250 MG TABS Take 250 mg by mouth daily.   Yes [provider]  Omega-3 Fatty Acids (FISH OIL) 1000 MG CAPS Take 1,000 mg by mouth daily.   Yes [provider]  rosuvastatin (CRESTOR) 10 MG tablet Take 1/2 to 1 tablet daily as directed for Cholesterol Patient taking differently: Take 10 mg by mouth daily.  03/20/16 06/20/16  Unk Pinto, MD     Allergies:   Pravastatin  Social History   Social History  . Marital status: Married    Spouse name: N/A  . Number of children: N/A  . Years of education: N/A   Occupational History  . [employer]    [place]   Social History Main Topics  . Smoking status: Former Smoker    Quit date: 07/29/2006  . Smokeless tobacco: Never Used  . Alcohol use 9.0 oz/week    15 Cans of beer per week  . Drug use: No  . Sexual activity: Yes   Other Topics Concern  . 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   ROS:   Please see the history of present illness.    ROS All other systems reviewed and are negative.   PHYSICAL EXAM:   VS:  BP 126/76   Pulse 66   Ht 6' (1.829 m)   Wt 216 lb (98 kg)   BMI 29.29 kg/m    GEN: Well nourished, well developed, in no acute distress  HEENT: normal  Neck: no JVD, carotid bruits, or masses Cardiac: RRR; no murmurs, rubs, or gallops,no edema  Respiratory:  clear to auscultation bilaterally, normal work of breathing GI: soft, nontender, nondistended, + BS MS: no deformity or atrophy  Skin: warm and dry, no rash Neuro:  Alert and Oriented x 3, Strength and sensation are intact Psych: euthymic mood, full affect  Wt Readings from Last 3 Encounters:  06/24/16 216 lb (98 kg)  02/10/16 214 lb (97.1 kg)  12/30/15 208 lb (94.3 kg)      Studies/Labs Reviewed:   EKG:  EKG is ordered today.  The ekg ordered today demonstrates NSR at rate of 66 bpm.   Recent Labs: 06/25/2015: Magnesium 2.1 02/10/2016: ALT 28; TSH 1.39 06/19/2016: BUN 17; Creatinine, Ser 0.65; Hemoglobin 15.3; Platelets 131; Potassium 4.1; Sodium 138   Lipid Panel    Component Value Date/Time   CHOL 231 (H) 02/10/2016 1413   TRIG 284 (H) 02/10/2016 1413   HDL 87 02/10/2016 1413   CHOLHDL 2.7 02/10/2016 1413   VLDL 57 (H) 02/10/2016 1413   LDLCALC 87 02/10/2016 1413    Additional studies/ records that were reviewed today include:   Echocardiogram: 05/07/13 Study Conclusions  - Left ventricle: The cavity size was normal. Wall thickness was increased in a pattern of mild LVH. Systolic function was normal. The estimated  ejection fraction was in the range of 60% to 65%. Wall motion was normal; there were no regional wall motion abnormalities. - Right atrium: The atrium was mildly dilated.    ASSESSMENT & PLAN:    1. PAF - Converted to sinus rhythm on long acting cardizem  in few hours after 1 dose. Took Eliquis for 2 days only due to [employer] co-pay (pharmacy gave him 5 tables). He did not get free samples. His episode is likely related to excess drinking of caffeinated product. Discuss with Dr. Angelena Form (DOD). Continue long acting Cardizem daily with short-acting for breakthrough episode. No need to start anticoagulation as CHADSVASC score is 0. - If recurrent episode -->consider TSH, echocardiogram, anticoagulation and monitor. May need  EP referral for ablation.  2. HLD - Continue low-dose Crestor. Managed by PCP.   Medication Adjustments/Labs and Tests Ordered: Current medicines are reviewed at length with the patient today.  Concerns regarding medicines are outlined above.  Medication changes, Labs and Tests ordered today are listed in the Patient Instructions below. There are no Patient Instructions on file for this visit.   Jarrett Soho, [place]  06/24/2016 11:39 AM    [facility] [address], [place], [place]  [address] Phone: [phone]; Fax: [phone]

## 2016-06-24 ENCOUNTER — Encounter: Payer: Self-pay | Admitting: Internal Medicine

## 2016-06-24 ENCOUNTER — Encounter: Payer: Self-pay | Admitting: Physician Assistant

## 2016-06-24 ENCOUNTER — Ambulatory Visit (INDEPENDENT_AMBULATORY_CARE_PROVIDER_SITE_OTHER): Payer: Commercial Managed Care - PPO | Admitting: Physician Assistant

## 2016-06-24 VITALS — BP 126/76 | HR 66 | Ht 72.0 in | Wt 216.0 lb

## 2016-06-24 DIAGNOSIS — I480 Paroxysmal atrial fibrillation: Secondary | ICD-10-CM

## 2016-06-24 DIAGNOSIS — E784 Other hyperlipidemia: Secondary | ICD-10-CM | POA: Diagnosis not present

## 2016-06-24 DIAGNOSIS — K219 Gastro-esophageal reflux disease without esophagitis: Secondary | ICD-10-CM | POA: Diagnosis not present

## 2016-06-24 DIAGNOSIS — E7849 Other hyperlipidemia: Secondary | ICD-10-CM

## 2016-06-24 NOTE — Patient Instructions (Signed)
Your physician recommends that you continue on your current medications as directed. Please refer to the Current Medication list given to you today.  Your physician recommends that you schedule a follow-up appointment in: 8 weeks with Dr. Acie Fredrickson.

## 2016-06-25 ENCOUNTER — Other Ambulatory Visit: Payer: Self-pay | Admitting: Internal Medicine

## 2016-07-01 ENCOUNTER — Ambulatory Visit (INDEPENDENT_AMBULATORY_CARE_PROVIDER_SITE_OTHER): Payer: Commercial Managed Care - PPO | Admitting: Gastroenterology

## 2016-07-01 ENCOUNTER — Encounter: Payer: Self-pay | Admitting: Gastroenterology

## 2016-07-01 VITALS — BP 110/76 | HR 68 | Ht 71.0 in | Wt 212.2 lb

## 2016-07-01 DIAGNOSIS — K625 Hemorrhage of anus and rectum: Secondary | ICD-10-CM

## 2016-07-01 DIAGNOSIS — Z800 Family history of malignant neoplasm of digestive organs: Secondary | ICD-10-CM | POA: Diagnosis not present

## 2016-07-01 MED ORDER — NA SULFATE-K SULFATE-MG SULF 17.5-3.13-1.6 GM/177ML PO SOLN: ORAL | 0 refills | Status: DC

## 2016-07-01 NOTE — Progress Notes (Signed)
07/01/2016 Michael Kim [id number] 08-Sep-1970   HISTORY OF PRESENT ILLNESS:  This is a pleasant 46 year old male who is known to Dr. Ardis Hughs for colonoscopy in 04/2011 at which time the study was normal.  He was told to have another colonoscopy in 5 years from that time since his father had polyps at a young age and he has an uncle who passed away from colon cancer in his 54's.  He is here today to schedule that, but also had made the appt to discuss some rectal bleeding.  He has been seen for complaints of rectal bleeding in the past.  He describes bright red blood streaks on the toilet paper intermittently, but two weeks ago he had one episode with a moderate amount of red blood in the toilet water.  Never had recurrence of bleeding after that.  Says that he takes metamucil daily and that makes him very regular with his BM's.  He says that the stool he passed with the incident of blood in the toilet bowl was actually quite hard, which is unusual, but wonders it that is what caused the bleeding.  He denies any abdominal pain, rectal/anal pain, etc.  Recent Hgb was normal.   Past Medical History:  Diagnosis Date  . Atrial fibrillation with rapid ventricular response ([place])   . GERD (gastroesophageal reflux disease)   . Hyperlipidemia   . Other testicular hypofunction   . Unspecified vitamin D deficiency    Past Surgical History:  Procedure Laterality Date  . SHOULDER SURGERY  2011    reports that he quit smoking about 9 years ago. He has never used smokeless tobacco. He reports that he drinks about 9.0 oz of alcohol per week . He reports that he does not use drugs. family history includes Cancer in his mother; Cancer (age of onset: 83) in his maternal grandmother; Colon cancer in his paternal uncle; Colon polyps in his father; Depression in his maternal grandmother; Heart disease in his paternal grandfather; Hyperlipidemia in his father; Lymphoma in his mother. Allergies  Allergen Reactions   . Pravachol [Pravastatin]     Fatigue      Outpatient Encounter Prescriptions as of 07/01/2016  Medication Sig  . aspirin EC 81 MG tablet Take 81 mg by mouth daily.  . Cholecalciferol (VITAMIN D PO) Take 10,000 Units by mouth daily.  Marland Kitchen CIALIS 20 MG tablet TAKE 1 TABLET DAILY AS NEEDED FOR ERECTILE DYSFUNCTION  . diltiazem (CARDIZEM CD) 180 MG 24 hr capsule Take 1 capsule (180 mg total) by mouth daily.  . Magnesium 250 MG TABS Take 250 mg by mouth daily.  . Omega-3 Fatty Acids (FISH OIL) 1000 MG CAPS Take 1,000 mg by mouth daily.  . rosuvastatin (CRESTOR) 10 MG tablet TAKE 1/2 TO 1 TABLET BY MOUTH DAILY AS DIRECTED FOR CHOLESTEROL  . diltiazem (CARDIZEM) 60 MG tablet Take 60 mg by mouth as needed.  . [DISCONTINUED] apixaban (ELIQUIS) 5 MG TABS tablet Take 1 tablet (5 mg total) by mouth 2 (two) times daily.  . [DISCONTINUED] diltiazem (CARDIZEM) 60 MG tablet Take 1 tablet (60 mg total) by mouth 4 (four) times daily as needed.  . [DISCONTINUED] ibuprofen (ADVIL,MOTRIN) 200 MG tablet Take 400 mg by mouth every 6 (six) hours as needed (pain).  . [DISCONTINUED] rosuvastatin (CRESTOR) 10 MG tablet Take 1/2 to 1 tablet daily as directed for Cholesterol (Patient taking differently: Take 10 mg by mouth daily. )   No facility-administered encounter medications on file  as of 07/01/2016.      REVIEW OF SYSTEMS  : All other systems reviewed and negative except where noted in the History of Present Illness.   PHYSICAL EXAM: BP 110/76 (BP Location: Left Arm, Patient Position: Sitting, Cuff Size: Normal)   Pulse 68   Ht 5\' 11"  (1.803 m) Comment: height measured without shoes  Wt 212 lb 4 oz (96.3 kg)   BMI 29.60 kg/m  General: Well developed white male in no acute distress Head: Normocephalic and atraumatic Eyes:  Sclerae anicteric, conjunctiva pink. Ears: Normal auditory acuity Lungs: Clear throughout to auscultation; no increased WOB. Heart: Regular rate and rhythm Abdomen: Soft,  non-distended.  Normal bowel sounds.  Non-tender. Rectal:  Will be done at the time of colonoscopy. Musculoskeletal: Symmetrical with no gross deformities  Skin: No lesions on visible extremities Extremities: No edema  Neurological: Alert oriented x 4, grossly non-focal Psychological:  Alert and cooperative. Normal mood and affect  ASSESSMENT AND PLAN: -Family history of colon cancer and colon polyps:  Father had polyps at a young age and uncle died of colon cancer.  Last colonoscopy was 04/2011.  He is due for that so will schedule with Dr. Ardis Hughs. -Rectal bleeding:  Minor for the most part, but had one episode of larger amount of bleeding a couple of weeks ago.  Has been a recurrent issue/complaint with him.  This is likely from an internal hemorrhoid/outlet source of bleeding.  Due for colonoscopy anyway.  *The risks, benefits, and alternatives to colonoscopy were discussed with the patient and he consents to proceed.    CC:  Unk Pinto, MD

## 2016-07-01 NOTE — Patient Instructions (Signed)
If you are age 45 or older, your body mass index should be between 23-30. Your Body mass index is 29.6 kg/m. If this is out of the aforementioned range listed, please consider follow up with your Primary Care Provider.  If you are age 57 or younger, your body mass index should be between 19-25. Your Body mass index is 29.6 kg/m. If this is out of the aformentioned range listed, please consider follow up with your Primary Care Provider.   You have been scheduled for a colonoscopy. Please follow written instructions given to you at your visit today.  Please pick up your prep supplies at the pharmacy within the next 1-3 days. If you use inhalers (even only as needed), please bring them with you on the day of your procedure. Your physician has requested that you go to www.startemmi.com and enter the access code given to you at your visit today. This web site gives a general overview about your procedure. However, you should still follow specific instructions given to you by our office regarding your preparation for the procedure.  Thank you for choosing me and [facility].  Alonza Bogus, PA-C

## 2016-07-02 NOTE — Progress Notes (Signed)
I agree with the above note, plan 

## 2016-07-23 ENCOUNTER — Other Ambulatory Visit: Payer: Self-pay

## 2016-07-23 MED ORDER — ROSUVASTATIN CALCIUM 10 MG PO TABS: ORAL_TABLET | ORAL | 0 refills | Status: DC

## 2016-07-26 ENCOUNTER — Other Ambulatory Visit: Payer: Self-pay | Admitting: Physician Assistant

## 2016-07-26 MED ORDER — BACLOFEN 10 MG PO TABS: 10.0000 mg | ORAL_TABLET | Freq: Two times a day (BID) | ORAL | 1 refills | Status: DC

## 2016-07-26 MED ORDER — PREDNISONE 20 MG PO TABS: ORAL_TABLET | ORAL | 0 refills | Status: AC

## 2016-08-04 ENCOUNTER — Encounter: Payer: Self-pay | Admitting: Gastroenterology

## 2016-08-11 ENCOUNTER — Ambulatory Visit (INDEPENDENT_AMBULATORY_CARE_PROVIDER_SITE_OTHER): Payer: Commercial Managed Care - PPO | Admitting: Physician Assistant

## 2016-08-11 ENCOUNTER — Other Ambulatory Visit: Payer: Self-pay | Admitting: Physician Assistant

## 2016-08-11 VITALS — BP 122/80 | HR 66 | Temp 97.9°F | Resp 14 | Ht 72.0 in | Wt 211.0 lb

## 2016-08-11 DIAGNOSIS — Z125 Encounter for screening for malignant neoplasm of prostate: Secondary | ICD-10-CM

## 2016-08-11 DIAGNOSIS — I4891 Unspecified atrial fibrillation: Secondary | ICD-10-CM

## 2016-08-11 DIAGNOSIS — Z0000 Encounter for general adult medical examination without abnormal findings: Secondary | ICD-10-CM

## 2016-08-11 DIAGNOSIS — E349 Endocrine disorder, unspecified: Secondary | ICD-10-CM

## 2016-08-11 DIAGNOSIS — E782 Mixed hyperlipidemia: Secondary | ICD-10-CM

## 2016-08-11 DIAGNOSIS — R7309 Other abnormal glucose: Secondary | ICD-10-CM

## 2016-08-11 DIAGNOSIS — I10 Essential (primary) hypertension: Secondary | ICD-10-CM

## 2016-08-11 DIAGNOSIS — E559 Vitamin D deficiency, unspecified: Secondary | ICD-10-CM

## 2016-08-11 DIAGNOSIS — Z79899 Other long term (current) drug therapy: Secondary | ICD-10-CM

## 2016-08-11 LAB — CBC WITH DIFFERENTIAL/PLATELET
BASOS PCT: 0 %
Basophils Absolute: 0 cells/uL (ref 0–200)
EOS PCT: 3 %
Eosinophils Absolute: 219 cells/uL (ref 15–500)
HCT: 44.9 % (ref 38.5–50.0)
HEMOGLOBIN: 15 g/dL (ref 13.2–17.1)
LYMPHS ABS: 2555 {cells}/uL (ref 850–3900)
Lymphocytes Relative: 35 %
MCH: 32.1 pg (ref 27.0–33.0)
MCHC: 33.4 g/dL (ref 32.0–36.0)
MCV: 95.9 fL (ref 80.0–100.0)
MPV: 10.2 fL (ref 7.5–12.5)
Monocytes Absolute: 803 cells/uL (ref 200–950)
Monocytes Relative: 11 %
NEUTROS ABS: 3723 {cells}/uL (ref 1500–7800)
Neutrophils Relative %: 51 %
Platelets: 167 10*3/uL (ref 140–400)
RBC: 4.68 MIL/uL (ref 4.20–5.80)
RDW: 13.2 % (ref 11.0–15.0)
WBC: 7.3 10*3/uL (ref 3.8–10.8)

## 2016-08-11 LAB — TSH: TSH: 1.86 m[IU]/L (ref 0.40–4.50)

## 2016-08-11 NOTE — Progress Notes (Signed)
Patient ID: Michael Kim, male   DOB: 1970/05/26, 46 y.o.   MRN: [id number]  Assessment and Plan:  Hypertension:  -Continue medication,  -monitor blood pressure at home.  -Continue DASH diet.   -Reminder to go to the ER if any CP, SOB, nausea, dizziness, severe HA, changes vision/speech, left arm numbness and tingling, and jaw pain.  Cholesterol: -Continue diet and exercise.  -Check cholesterol.  -was taken off medication 4 months ago  Pre-diabetes: -Continue diet and exercise.  -Check A1C  Vitamin D Def: -check level -continue medications.   Atrial  Fibrillation -seems to be in NSR - follow up cardio - continue bASA  Vitamin D deficiency Continue supplement  Testosterone deficiency Increase exercise, add zinc  Routine general medical examination at a health care facility Due next year Follows cardio, no need for EKG  Screening for prostate cancer Check PSA  Continue diet and meds as discussed. Further disposition pending results of labs. Future Appointments Date Time Provider [place]  08/26/2016 10:00 AM Nahser, Wonda Cheng, MD [facility]  08/22/2017 3:00 PM Vicie Mutters, PA-C [id number] None    HPI 46 y.o. male  presents for 3 month follow up with hypertension, hyperlipidemia, prediabetes and vitamin D.   His blood pressure has been controlled at home, today their BP is BP: 122/80.   He does workout. He denies chest pain, shortness of breath, dizziness. He has history of Afib, sees cardio in 2 weeks, has had 2 episodes, CHADSVAC 1, on bASA, dad had afib as well, will discuss with cardio the options.   He is not on cholesterol medication and denies myalgias. His cholesterol is at goal. The cholesterol last visit was:   Lab Results  Component Value Date   CHOL 226 (H) 08/11/2016   HDL 86 08/11/2016   LDLCALC 85 08/11/2016   TRIG 277 (H) 08/11/2016   CHOLHDL 2.6 08/11/2016    He has been working on diet and exercise for prediabetes, and  denies foot ulcerations, hyperglycemia, hypoglycemia , increased appetite, nausea, paresthesia of the feet, polydipsia, polyuria, visual disturbances, vomiting and weight loss. Last A1C in the office was:  Lab Results  Component Value Date   HGBA1C 5.1 08/11/2016   Patient is on Vitamin D supplement.  Lab Results  Component Value Date   VD25OH 46 08/11/2016     BMI is Body mass index is 28.62 kg/m., he is working on diet and exercise. Wt Readings from Last 3 Encounters:  08/12/16 211 lb (95.7 kg)  07/01/16 212 lb 4 oz (96.3 kg)  06/24/16 216 lb (98 kg)   Had bee sting right medial ankle, still some redness, swelling 2 days after. No warmth, will monitor, no distal edema.   Current Medications:  Current Outpatient Prescriptions on File Prior to Visit  Medication Sig Dispense Refill  . aspirin EC 81 MG tablet Take 81 mg by mouth daily.    . Cholecalciferol (VITAMIN D PO) Take 10,000 Units by mouth daily.    Marland Kitchen CIALIS 20 MG tablet TAKE 1 TABLET DAILY AS NEEDED FOR ERECTILE DYSFUNCTION 6 tablet 99  . Magnesium 250 MG TABS Take 250 mg by mouth daily.    . Omega-3 Fatty Acids (FISH OIL) 1000 MG CAPS Take 1,000 mg by mouth daily.     No current facility-administered medications on file prior to visit.     Medical History:  Past Medical History:  Diagnosis Date  . Atrial fibrillation with rapid ventricular response ([place])   . GERD (gastroesophageal  reflux disease)   . Hyperlipidemia   . Other testicular hypofunction   . Unspecified vitamin D deficiency    Immunization History  Administered Date(s) Administered  . PPD Test 04/03/2013  . Td 11/08/2005  . Tdap 06/25/2015   Tetanus: 2017 Colonoscopy: 2013, due this year due to [place] Exam: 2017 Dentist: Twice yearly  Patient Care Team: Unk Pinto, MD as PCP - General (Internal Medicine) Milus Banister, MD as Attending Physician (Gastroenterology) Thornell Sartorius, MD as Consulting Physician (Otolaryngology) Willow Ora (Optometry) Justice Britain, MD as Consulting Physician (Orthopedic Surgery) Newman Pies, MD as Consulting Physician (Neurosurgery)  Review of Systems:  Review of Systems  Constitutional: Negative for chills, fever and malaise/fatigue.  HENT: Negative for congestion, ear discharge and sore throat.   Eyes: Negative.   Respiratory: Negative for cough, shortness of breath and wheezing.   Cardiovascular: Negative for chest pain, palpitations and leg swelling.  Gastrointestinal: Negative for abdominal pain, blood in stool, constipation, diarrhea, heartburn and melena.  Genitourinary: Negative.   Skin: Negative.   Neurological: Negative for headaches.   Allergies Allergies  Allergen Reactions  . Pravachol [Pravastatin]     Fatigue    SURGICAL HISTORY He  has a past surgical history that includes Shoulder surgery (2011). FAMILY HISTORY His family history includes Cancer in his mother; Cancer (age of onset: 92) in his maternal grandmother; Colon cancer in his paternal uncle; Colon polyps in his father; Depression in his maternal grandmother; Heart disease in his paternal grandfather; Hyperlipidemia in his father; Lymphoma in his mother. SOCIAL HISTORY He  reports that he quit smoking about 10 years ago. He has never used smokeless tobacco. He reports that he drinks about 9.0 oz of alcohol per week . He reports that he does not use drugs.   Physical Exam: BP 122/80   Pulse 66   Temp 97.9 F (36.6 C)   Resp 14   Ht 6' (1.829 m)   Wt 211 lb (95.7 kg)   SpO2 99%   BMI 28.62 kg/m  Wt Readings from Last 3 Encounters:  08/12/16 211 lb (95.7 kg)  07/01/16 212 lb 4 oz (96.3 kg)  06/24/16 216 lb (98 kg)    General Appearance: Well nourished well developed, in no apparent distress. Eyes: PERRLA, EOMs, conjunctiva no swelling or erythema ENT/Mouth: Ear canals normal without obstruction, swelling, erythma, discharge.  TMs normal bilaterally.  Oropharynx moist, clear, without  exudate, or postoropharyngeal swelling. Neck: Supple, thyroid normal,no cervical adenopathy  Respiratory: Respiratory effort normal, Breath sounds clear A&P without rhonchi, wheeze, or rale.  No retractions, no accessory usage. Cardio: RRR with no MRGs. Brisk peripheral pulses without edema.  Abdomen: Soft, + BS,  Non tender, no guarding, rebound, hernias, masses. Musculoskeletal: Full ROM, 5/5 strength, Normal gait,  right medial ankle some erythema, swelling, No warmth, no distal edema.  Skin: Warm, dry without rashes, lesions, ecchymosis.  Neuro: Awake and oriented X 3, Cranial nerves intact. Normal muscle tone, no cerebellar symptoms. Psych: Normal affect, Insight and Judgment appropriate.   EKG Defer cardio Aorta Defer due to age  Vicie Mutters, PA-C 9:38 AM [facility]

## 2016-08-12 ENCOUNTER — Encounter: Payer: Self-pay | Admitting: Physician Assistant

## 2016-08-12 ENCOUNTER — Other Ambulatory Visit: Payer: Self-pay

## 2016-08-12 LAB — LIPID PANEL
CHOL/HDL RATIO: 2.6 ratio (ref <5.0)
CHOLESTEROL: 226 mg/dL — AB (ref <200)
HDL: 86 mg/dL (ref >40)
LDL Cholesterol: 85 mg/dL (ref <100)
TRIGLYCERIDES: 277 mg/dL — AB (ref <150)
VLDL: 55 mg/dL — AB (ref <30)

## 2016-08-12 LAB — URINALYSIS, ROUTINE W REFLEX MICROSCOPIC
BILIRUBIN URINE: NEGATIVE
Glucose, UA: NEGATIVE
Hgb urine dipstick: NEGATIVE
Ketones, ur: NEGATIVE
LEUKOCYTES UA: NEGATIVE
Nitrite: NEGATIVE
Protein, ur: NEGATIVE
Specific Gravity, Urine: 1.028 (ref 1.001–1.035)
pH: 5.5 (ref 5.0–8.0)

## 2016-08-12 LAB — HEPATIC FUNCTION PANEL
ALT: 23 U/L (ref 9–46)
AST: 20 U/L (ref 10–40)
Albumin: 4.4 g/dL (ref 3.6–5.1)
Alkaline Phosphatase: 54 U/L (ref 40–115)
Bilirubin, Direct: 0.1 mg/dL (ref <=0.2)
Indirect Bilirubin: 0.5 mg/dL (ref 0.2–1.2)
TOTAL PROTEIN: 6.9 g/dL (ref 6.1–8.1)
Total Bilirubin: 0.6 mg/dL (ref 0.2–1.2)

## 2016-08-12 LAB — MICROALBUMIN / CREATININE URINE RATIO
Creatinine, Urine: 170 mg/dL (ref 20–370)
MICROALB/CREAT RATIO: 3 ug/mg{creat} (ref <30)
Microalb, Ur: 0.5 mg/dL

## 2016-08-12 LAB — BASIC METABOLIC PANEL WITH GFR
BUN: 22 mg/dL (ref 7–25)
CALCIUM: 9.6 mg/dL (ref 8.6–10.3)
CO2: 24 mmol/L (ref 20–32)
Chloride: 101 mmol/L (ref 98–110)
Creat: 0.85 mg/dL (ref 0.60–1.35)
GFR, Est African American: >89 mL/min (ref >=60)
GLUCOSE: 90 mg/dL (ref 65–99)
POTASSIUM: 4.2 mmol/L (ref 3.5–5.3)
Sodium: 137 mmol/L (ref 135–146)

## 2016-08-12 LAB — HEMOGLOBIN A1C
Hgb A1c MFr Bld: 5.1 % (ref <5.7)
Mean Plasma Glucose: 100 mg/dL

## 2016-08-12 LAB — PSA: PSA: 0.8 ng/mL (ref <=4.0)

## 2016-08-12 LAB — MAGNESIUM: MAGNESIUM: 2.2 mg/dL (ref 1.5–2.5)

## 2016-08-12 LAB — VITAMIN D 25 HYDROXY (VIT D DEFICIENCY, FRACTURES): VIT D 25 HYDROXY: 46 ng/mL (ref 30–100)

## 2016-08-12 MED ORDER — ROSUVASTATIN CALCIUM 10 MG PO TABS: ORAL_TABLET | ORAL | 0 refills | Status: DC

## 2016-08-18 ENCOUNTER — Encounter: Payer: Commercial Managed Care - PPO | Admitting: Gastroenterology

## 2016-08-26 ENCOUNTER — Ambulatory Visit (INDEPENDENT_AMBULATORY_CARE_PROVIDER_SITE_OTHER): Payer: Commercial Managed Care - PPO | Admitting: Cardiovascular Disease

## 2016-08-26 ENCOUNTER — Encounter: Payer: Self-pay | Admitting: Cardiovascular Disease

## 2016-08-26 VITALS — BP 120/84 | HR 63 | Ht 72.0 in | Wt 210.6 lb

## 2016-08-26 DIAGNOSIS — I480 Paroxysmal atrial fibrillation: Secondary | ICD-10-CM | POA: Diagnosis not present

## 2016-08-26 DIAGNOSIS — I482 Chronic atrial fibrillation, unspecified: Secondary | ICD-10-CM

## 2016-08-26 MED ORDER — PROPRANOLOL HCL 10 MG PO TABS: 10.0000 mg | ORAL_TABLET | Freq: Four times a day (QID) | ORAL | 11 refills | Status: DC | PRN

## 2016-08-26 NOTE — Progress Notes (Signed)
Michael Kim Date of Birth  01/12/1970       [facility]    [employer] [address], [name], [name]   [name], [name] [name]   Fax  [phone]     Fax [phone]  Problem List: 1. Atrial fibrillation 2. Possible obstructive sleep apnea 3.  History of Present Illness:  Michael Kim is a 46 yo with hx of PAF.  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  I met him at the hospital with rapid AF.  He converted spontaneously.  Has not had any significant arrhythmias.     Aug. 23, 2018:  Michael Kim is seen back today after a several year absence. He was seen in the emergency room with rapid atrial fibrillation. He was started on Eliquis and Cardizem.   He converted to NSR .  He stopped the Eliquis and Diltiazem . Feels great.  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  Quit drinking caffiene.  Snores,  Does not know if he has apneic episodes.   Current Outpatient Prescriptions on File Prior to Visit  Medication Sig Dispense Refill  . aspirin EC 81 MG tablet Take 81 mg by mouth daily.    . Cholecalciferol (VITAMIN D PO) Take 10,000 Units by mouth daily.    . Magnesium 250 MG TABS Take 250 mg by mouth daily.    . Omega-3 Fatty Acids (FISH OIL) 1000 MG CAPS Take 1,000 mg by mouth daily.    . rosuvastatin (CRESTOR) 10 MG tablet TAKE 1/2 TO 1 TABLET BY MOUTH DAILY AS DIRECTED FOR CHOLESTEROL 45 tablet 0   No current facility-administered medications on file prior to visit.     Allergies  Allergen Reactions  . Pravachol [Pravastatin]     Fatigue    Past Medical History:  Diagnosis Date  . Atrial fibrillation with rapid ventricular response ([place])   . GERD (gastroesophageal reflux disease)   . Hyperlipidemia   . Other testicular hypofunction   . Unspecified vitamin D deficiency     Past Surgical History:  Procedure Laterality Date  . SHOULDER SURGERY  2011    History  Smoking Status  . Former Smoker    . Quit date: 07/29/2006  Smokeless Tobacco  . Never Used    History  Alcohol Use  . 9.0 oz/week  . 15 Cans of beer per week    Family History  Problem Relation Age of Onset  . Colon polyps Father   . Hyperlipidemia Father   . Lymphoma Mother   . Cancer Mother        lymphoma  . Colon cancer Paternal Uncle   . Depression Maternal Grandmother   . Cancer Maternal Grandmother 15       fatal colon  . Heart disease Paternal Grandfather     Reviw of Systems:  Reviewed in the HPI.  All other systems are negative.  Physical Exam: Blood pressure 120/84, pulse 63, height 6' (1.829 m), weight 210 lb 9.6 oz (95.5 kg). Wt Readings from Last 3 Encounters:  08/26/16 210 lb 9.6 oz (95.5 kg)  08/12/16 211 lb (95.7 kg)  07/01/16 212 lb 4 oz (96.3 kg)     General: Well developed, well nourished, in no acute distress.  Head: Normocephalic, atraumatic, sclera non-icteric, mucus membranes are moist,   Neck: Supple. Carotids are 2 + without bruits. No JVD   Lungs: Clear  Heart: RR, normal s1s2  Abdomen: Soft, non-tender, non-distended with normal bowel sounds.  Msk:  Strength and tone are normal   Extremities: No clubbing or cyanosis. No edema.  Distal pedal pulses are 2+ and equal    Neuro: CN II - XII intact.  Alert and oriented X 3.   Psych:  Normal   ECG:   Assessment / Plan:  1. Paroxysmal atrial fibrillation: Michael Kim continues to have episodes of paroxysmal atrial fibrillation. He converts fairly quickly. I'll give him some propranolol to take at home. He also has Cardizem 60 mg tablets.  Knows that he snores but does not know if he has apneic episodes. We'll schedule him for a sleep study. I'll see him again in one year.

## 2016-08-26 NOTE — Patient Instructions (Addendum)
Medication Instructions:  START Propranolol (Inderal) 10 mg up to 4 times per day as needed for palpitations/fluttering/fast heart rate   Labwork: None Ordered   Testing/Procedures: Your physician has recommended that you have a sleep study. This test records several body functions during sleep, including: brain activity, eye movement, oxygen and carbon dioxide blood levels, heart rate and rhythm, breathing rate and rhythm, the flow of air through your mouth and nose, snoring, body muscle movements, and chest and belly movement. **Our office will call you to schedule  Follow-Up: Your physician wants you to follow-up in: 1 year with Dr. Acie Fredrickson.  You will receive a reminder letter in the mail two months in advance. If you don't receive a letter, please call our office to schedule the follow-up appointment.   If you need a refill on your cardiac medications before your next appointment, please call your pharmacy.   Thank you for choosing [facility]! Christen Bame, RN [phone]

## 2016-09-02 ENCOUNTER — Telehealth: Payer: Self-pay | Admitting: *Deleted

## 2016-09-02 DIAGNOSIS — I10 Essential (primary) hypertension: Secondary | ICD-10-CM

## 2016-09-02 NOTE — Telephone Encounter (Signed)
Informed patient of upcoming sleep study and patient understanding was verbalized. Patient understands his sleep study is scheduled for Sunday October 24 2016. Patient understands his sleep study will be done at [facility] sleep lab. Patient understands he will receive a sleep packet in a week or so. Patient understands to call if he does not receive the sleep packet in a timely manner. Patient agrees with treatment and thanked me for call.

## 2016-09-02 NOTE — Telephone Encounter (Signed)
-----   Message from Emmaline Life, RN sent at 08/26/2016 10:37 AM EDT ----- Anette Guarneri,  Please schedule Dr. Elmarie Shiley patient for a sleep study.  His Epworth score is 9  Thank you! Sharyn Lull

## 2016-09-03 ENCOUNTER — Encounter: Payer: Self-pay | Admitting: *Deleted

## 2016-09-03 NOTE — Telephone Encounter (Signed)
This encounter was created in error - please disregard.

## 2016-09-03 NOTE — Telephone Encounter (Signed)
-----   Message from Emmaline Life, RN sent at 08/26/2016 10:37 AM EDT ----- Anette Guarneri,  Please schedule Dr. Elmarie Shiley patient for a sleep study.  His Epworth score is 9  Thank you! Sharyn Lull

## 2016-10-06 ENCOUNTER — Encounter: Payer: Self-pay | Admitting: Gastroenterology

## 2016-10-24 ENCOUNTER — Encounter (HOSPITAL_BASED_OUTPATIENT_CLINIC_OR_DEPARTMENT_OTHER): Payer: Commercial Managed Care - PPO

## 2016-11-17 ENCOUNTER — Ambulatory Visit (HOSPITAL_BASED_OUTPATIENT_CLINIC_OR_DEPARTMENT_OTHER): Payer: Commercial Managed Care - PPO | Attending: Cardiovascular Disease | Admitting: Cardiology

## 2016-11-17 VITALS — Ht 72.0 in | Wt 204.0 lb

## 2016-11-17 DIAGNOSIS — I491 Atrial premature depolarization: Secondary | ICD-10-CM | POA: Diagnosis not present

## 2016-11-17 DIAGNOSIS — I480 Paroxysmal atrial fibrillation: Secondary | ICD-10-CM

## 2016-11-17 DIAGNOSIS — G4736 Sleep related hypoventilation in conditions classified elsewhere: Secondary | ICD-10-CM | POA: Insufficient documentation

## 2016-11-17 DIAGNOSIS — G4733 Obstructive sleep apnea (adult) (pediatric): Secondary | ICD-10-CM | POA: Insufficient documentation

## 2016-11-17 DIAGNOSIS — R0683 Snoring: Secondary | ICD-10-CM | POA: Insufficient documentation

## 2016-11-23 ENCOUNTER — Telehealth: Payer: Self-pay | Admitting: Cardiovascular Disease

## 2016-11-23 NOTE — Telephone Encounter (Signed)
Left message for patient to call back  

## 2016-11-23 NOTE — Telephone Encounter (Signed)
Has not been read yet

## 2016-11-23 NOTE — Telephone Encounter (Signed)
Spoke with patient who states he is waiting for results of his sleep study which was done on 11/14. I advised that Dr. Acie Fredrickson has not received the results and that I will check with Dr. Radford Pax to see if she has seen the test. I advised I will call him back once we have the results. He verbalized understanding and agreement and thanked me for the call.

## 2016-11-23 NOTE — Telephone Encounter (Signed)
°  New Prob  Has some questions regarding recommended sleep study. Please call.

## 2016-11-30 NOTE — Telephone Encounter (Signed)
Patient called left message today for his sleep study results.Patient states this was his 3rd call to get his results. Sleep assistant returned his call and left a message that there were no results yet but would call him when the result came in. 3:20pm 11/30/16.

## 2016-11-30 NOTE — Telephone Encounter (Signed)
Left message on patient's voice mail to apologize for the delay in getting the results of his sleep study. I advised him that I will call as soon as those results are available and advised him to call back with other questions or concerns.

## 2016-12-01 ENCOUNTER — Other Ambulatory Visit: Payer: Self-pay

## 2016-12-01 ENCOUNTER — Ambulatory Visit (AMBULATORY_SURGERY_CENTER): Payer: Self-pay | Admitting: *Deleted

## 2016-12-01 VITALS — Ht 72.0 in | Wt 215.0 lb

## 2016-12-01 DIAGNOSIS — Z8371 Family history of colonic polyps: Secondary | ICD-10-CM

## 2016-12-01 DIAGNOSIS — Z800 Family history of malignant neoplasm of digestive organs: Secondary | ICD-10-CM

## 2016-12-01 MED ORDER — NA SULFATE-K SULFATE-MG SULF 17.5-3.13-1.6 GM/177ML PO SOLN: ORAL | 0 refills | Status: DC

## 2016-12-01 NOTE — Telephone Encounter (Signed)
Patient called left message today for his sleep study results.Patient states this was his 3rd call to get his results. Sleep assistant returned his call and left a message that there were no results yet but would call him when the result came in. 3:20pm 11/30/16.

## 2016-12-01 NOTE — Progress Notes (Signed)
Patient denies any allergies to eggs or soy. Patient denies any problems with anesthesia/sedation. Patient denies any oxygen use at home. Patient denies taking any diet/weight loss medications or blood thinners. EMMI education declined by patient.

## 2016-12-06 ENCOUNTER — Telehealth: Payer: Self-pay | Admitting: Cardiology

## 2016-12-06 NOTE — Telephone Encounter (Signed)
New Message    Patient very upset it has been 3 weeks since he had the sleep study and this is his 4th call. He is having surgery on Thursday and has to have these results before they will proceed with the surgery.   He needs a call back from [place] or a Supervisor today!!

## 2016-12-06 NOTE — Telephone Encounter (Signed)
Patient states that he is very unhappy that he has been calling for 3 weeks for sleep study results and was told he would have his results in 2 days. Patient states he has surgery next week and needs his results prior to his surgery. I apologized for the delay and informed patient I would send to Dr. Radford Pax today to review.

## 2016-12-07 NOTE — Telephone Encounter (Signed)
Please let pattient know that [place] and it will be read this weekend - if this is not sufficient then see if Dr. Claiborne Billings can read it

## 2016-12-09 ENCOUNTER — Encounter: Payer: Self-pay | Admitting: Gastroenterology

## 2016-12-09 NOTE — Telephone Encounter (Addendum)
Hello Dr Radford Pax, Yes I have been telling the patient's that your were out of the office for 2 weeks and that I will call them as soon as I got the results but they still call everyday all day.       Patient very upset it has been 3 weeks since he had the sleep study and this is his 4th call. He is having surgery on Thursday and has to have these results before they will proceed with the surgery.    He needs a call back from [place] or a Supervisor today!!

## 2016-12-09 NOTE — Telephone Encounter (Signed)
Patient very upset it has been 3 weeks since he had the sleep study and this is his 4th call. He is having surgery on Thursday and has to have these results before they will proceed with the surgery.    He needs a call back from [place] or a Supervisor today!!

## 2016-12-09 NOTE — Telephone Encounter (Signed)
RE: SLEEP STUDY  Received: Today  Swinyer, Lanice Schwab, RN  Freada Bergeron, CMA        Gae Bon,  Dr. Acie Fredrickson sent Dr. Claiborne Billings a message asking him to read the sleep study.   Thank you,  Sharyn Lull

## 2016-12-09 NOTE — Telephone Encounter (Signed)
Dr. Acie Fredrickson has requested Dr. Claiborne Billings to read the study so that this patient can get his results before the end of this week. I will follow-up with the patient.

## 2016-12-15 ENCOUNTER — Other Ambulatory Visit: Payer: Self-pay

## 2016-12-15 ENCOUNTER — Encounter: Payer: Self-pay | Admitting: Gastroenterology

## 2016-12-15 ENCOUNTER — Ambulatory Visit (AMBULATORY_SURGERY_CENTER): Payer: Commercial Managed Care - PPO | Admitting: Gastroenterology

## 2016-12-15 VITALS — BP 117/87 | HR 62 | Temp 97.1°F | Resp 15 | Ht 72.0 in | Wt 215.0 lb

## 2016-12-15 DIAGNOSIS — Z1211 Encounter for screening for malignant neoplasm of colon: Secondary | ICD-10-CM

## 2016-12-15 DIAGNOSIS — I4891 Unspecified atrial fibrillation: Secondary | ICD-10-CM | POA: Diagnosis not present

## 2016-12-15 DIAGNOSIS — Z800 Family history of malignant neoplasm of digestive organs: Secondary | ICD-10-CM

## 2016-12-15 DIAGNOSIS — D124 Benign neoplasm of descending colon: Secondary | ICD-10-CM

## 2016-12-15 DIAGNOSIS — K219 Gastro-esophageal reflux disease without esophagitis: Secondary | ICD-10-CM | POA: Diagnosis not present

## 2016-12-15 DIAGNOSIS — K635 Polyp of colon: Secondary | ICD-10-CM | POA: Diagnosis not present

## 2016-12-15 DIAGNOSIS — K649 Unspecified hemorrhoids: Secondary | ICD-10-CM | POA: Diagnosis not present

## 2016-12-15 MED ORDER — SODIUM CHLORIDE 0.9 % IV SOLN: 500.0000 mL | Freq: Once | INTRAVENOUS | Status: DC

## 2016-12-15 NOTE — Patient Instructions (Signed)
YOU HAD AN ENDOSCOPIC PROCEDURE TODAY AT THE [facility]:   Refer to the procedure report that was given to you for any specific questions about what was found during the examination.  If the procedure report does not answer your questions, please call your gastroenterologist to clarify.  If you requested that your care partner not be given the details of your procedure findings, then the procedure report has been included in a sealed envelope for you to review at your convenience later.  YOU SHOULD EXPECT: Some feelings of bloating in the abdomen. Passage of more gas than usual.  Walking can help get rid of the air that was put into your GI tract during the procedure and reduce the bloating. If you had a lower endoscopy (such as a colonoscopy or flexible sigmoidoscopy) you may notice spotting of blood in your stool or on the toilet paper. If you underwent a bowel prep for your procedure, you may not have a normal bowel movement for a few days.  Please Note:  You might notice some irritation and congestion in your nose or some drainage.  This is from the oxygen used during your procedure.  There is no need for concern and it should clear up in a day or so.  SYMPTOMS TO REPORT IMMEDIATELY:   Following lower endoscopy (colonoscopy or flexible sigmoidoscopy):  Excessive amounts of blood in the stool  Significant tenderness or worsening of abdominal pains  Swelling of the abdomen that is new, acute  Fever of 100F or higher  For urgent or emergent issues, a gastroenterologist can be reached at any hour by calling [phone].   DIET:  We do recommend a small meal at first, but then you may proceed to your regular diet.  Drink plenty of fluids but you should avoid alcoholic beverages for 24 hours.  MEDICATIONS: Continue present medications.  Please see handouts given to you by your recovery nurse.  ACTIVITY:  You should plan to take it easy for the rest of today and you should NOT  DRIVE or use heavy machinery until tomorrow (because of the sedation medicines used during the test).    FOLLOW UP: Our staff will call the number listed on your records the next business day following your procedure to check on you and address any questions or concerns that you may have regarding the information given to you following your procedure. If we do not reach you, we will leave a message.  However, if you are feeling well and you are not experiencing any problems, there is no need to return our call.  We will assume that you have returned to your regular daily activities without incident.  If any biopsies were taken you will be contacted by phone or by letter within the next 1-3 weeks.  Please call us at [phone] if you have not heard about the biopsies in 3 weeks.   Thank you for allowing us to provide for your healthcare needs today.   SIGNATURES/CONFIDENTIALITY: You and/or your care partner have signed paperwork which will be entered into your electronic medical record.  These signatures attest to the fact that that the information above on your After Visit Summary has been reviewed and is understood.  Full responsibility of the confidentiality of this discharge information lies with you and/or your care-partner. 

## 2016-12-15 NOTE — Op Note (Signed)
[place] Patient Name: Michael Kim Procedure Date: 12/15/2016 9:10 AM MRN: [id number] Endoscopist: Milus Banister , MD Age: 46 Referring MD:  Date of Birth: January 27, 1970 Gender: Male Account #: [id number] Procedure:                Colonoscopy Indications:              Colon cancer screening in patient at increased                            risk: Family history of colorectal cancer (paternal                            uncle), also father with precancerous polyps in his                            58s; colonoscopy 5 years ago was normal. Medicines:                Monitored Anesthesia Care Procedure:                Pre-Anesthesia Assessment:                           - Prior to the procedure, a History and Physical                            was performed, and patient medications and                            allergies were reviewed. The patient's tolerance of                            previous anesthesia was also reviewed. The risks                            and benefits of the procedure and the sedation                            options and risks were discussed with the patient.                            All questions were answered, and informed consent                            was obtained. Prior Anticoagulants: The patient has                            taken no previous anticoagulant or antiplatelet                            agents. ASA Grade Assessment: II - A patient with                            mild systemic disease. After reviewing the risks  and benefits, the patient was deemed in                            satisfactory condition to undergo the procedure.                           After obtaining informed consent, the colonoscope                            was passed under direct vision. Throughout the                            procedure, the patient's blood pressure, pulse, and                            oxygen saturations  were monitored continuously. The                            Colonoscope was introduced through the anus and                            advanced to the the cecum, identified by                            appendiceal orifice and ileocecal valve. The                            colonoscopy was performed without difficulty. The                            patient tolerated the procedure well. The quality                            of the bowel preparation was good. The ileocecal                            valve, appendiceal orifice, and rectum were                            photographed. Scope In: 9:20:38 AM Scope Out: 9:29:43 AM Scope Withdrawal Time: 0 hours 7 minutes 2 seconds  Total Procedure Duration: 0 hours 9 minutes 5 seconds  Findings:                 A 2 mm polyp was found in the transverse colon. The                            polyp was sessile. The polyp was removed with a                            cold snare. Resection and retrieval were complete.                           Internal hemorrhoids were found. The hemorrhoids  were medium-sized.                           The exam was otherwise without abnormality on                            direct and retroflexion views. Complications:            No immediate complications. Estimated blood loss:                            None. Estimated Blood Loss:     Estimated blood loss: none. Impression:               - One 2 mm polyp in the transverse colon, removed                            with a cold snare. Resected and retrieved.                           - Internal hemorrhoids.                           - The examination was otherwise normal on direct                            and retroflexion views. Recommendation:           - Patient has a contact number available for                            emergencies. The signs and symptoms of potential                            delayed complications were discussed  with the                            patient. Return to normal activities tomorrow.                            Written discharge instructions were provided to the                            patient.                           - Resume previous diet.                           - Continue present medications.                           - Consider referral for hemorrhoidal banding in                            office for internal hemorroids (call if you are  interested).                           You will receive a letter within 2-3 weeks with the                            pathology results and my final recommendations. If                            the polyp(s) is proven to be 'pre-cancerous' on                            pathology, you will need repeat colonoscopy in 5                            years. Milus Banister, MD 12/15/2016 9:33:29 AM This report has been signed electronically.

## 2016-12-15 NOTE — Progress Notes (Signed)
Called to room to assist during endoscopic procedure.  Patient ID and intended procedure confirmed with present staff. Received instructions for my participation in the procedure from the performing physician.  

## 2016-12-15 NOTE — Progress Notes (Signed)
Report given to PACU, vss 

## 2016-12-16 ENCOUNTER — Telehealth: Payer: Self-pay

## 2016-12-16 NOTE — Procedures (Signed)
Patient Name: Michael Kim, Michael Kim Date: 11/17/2016 Gender: Male D.O.B: Dec 03, 1970 Age (years): 46 Referring Provider: Mertie Moores Height (inches): 72 Interpreting Physician: Fransico Him MD, ABSM Weight (lbs): 204 RPSGT: Jorge Ny BMI: 28 MRN: [id number] Neck Size: 17.00  CLINICAL INFORMATION Sleep Study Type: Split Night CPAP  Indication for sleep study: OSA  Epworth Sleepiness Score: 5  SLEEP STUDY TECHNIQUE As per the AASM Manual for the Scoring of Sleep and Associated Events v2.3 (April 2016) with a hypopnea requiring 4% desaturations.  The channels recorded and monitored were frontal, central and occipital EEG, electrooculogram (EOG), submentalis EMG (chin), nasal and oral airflow, thoracic and abdominal wall motion, anterior tibialis EMG, snore microphone, electrocardiogram, and pulse oximetry. Continuous positive airway pressure (CPAP) was initiated when the patient met split night criteria and was titrated according to treat sleep-disordered breathing.  MEDICATIONS Medications self-administered by patient taken the night of the study : N/A  RESPIRATORY PARAMETERS Diagnostic Total AHI (/hr): 33.5  RDI (/hr):51.0  OA Index (/hr): 22.3  CA Index (/hr): 0.0 REM AHI (/hr): 88.0  NREM AHI (/hr):21.0  Supine AHI (/hr):51.2  Non-supine AHI (/hr): 10.99 Min O2 Sat (%):82.00  Mean O2 (%): 93.50  Time below 88% (min):7.7    Titration Optimal Pressure (cm):7  AHI at Optimal Pressure (/hr):0.5  Min O2 at Optimal Pressure (%):91.0 Supine % at Optimal (%):71  Sleep % at Optimal (%):97    SLEEP ARCHITECTURE The recording time for the entire night was 422.7 minutes.  During a baseline period of 214.3 minutes, the patient slept for 161.2 minutes in REM and nonREM, yielding a sleep efficiency of 75.2%. Sleep onset after lights out was 19.1 minutes with a REM latency of 164.5 minutes. The patient spent 22.15% of the night in stage N1 sleep, 59.24% in stage N2  sleep, 0.00% in stage N3 and 18.61% in REM.  During the titration period of 197.0 minutes, the patient slept for 180.2 minutes in REM and nonREM, yielding a sleep efficiency of 91.4%. Sleep onset after CPAP initiation was 5.9 minutes with a REM latency of 78.5 minutes. The patient spent 9.99% of the night in stage N1 sleep, 46.43% in stage N2 sleep, 0.00% in stage N3 and 43.57% in REM.  CARDIAC DATA The 2 lead EKG demonstrated NSR. The mean heart rate was 57.22 beats per minute. Other EKG findings include: Frequent PACs.  LEG MOVEMENT DATA The total Periodic Limb Movements of Sleep (PLMS) were 4. The PLMS index was 0.70 .  IMPRESSIONS - Severe obstructive sleep apnea occurred during the diagnostic portion of the study (AHI = 33.5/hour). An optimal PAP pressure was selected for this patient ( 7 cm of water) - No significant central sleep apnea occurred during the diagnostic portion of the study (CAI = 0.0/hour). - The patient had minimal or no oxygen desaturation during the diagnostic portion of the study (Min O2 = 82.00%) - The patient snored with soft snoring volume during the diagnostic portion of the study. - PACs were noted during this study. - Clinically significant periodic limb movements did not occur during sleep.  DIAGNOSIS - Obstructive Sleep Apnea (327.23 [G47.33 ICD-10]) - Nocturnal Hypoxemia  RECOMMENDATIONS - Trial of CPAP therapy on 7 cm H2O with a Medium size Resmed Full Face Mask AirFit F20 mask and heated humidification. - Avoid alcohol, sedatives and other CNS depressants that may worsen sleep apnea and disrupt normal sleep architecture. - Sleep hygiene should be reviewed to assess factors that may improve sleep quality. - Weight  management and regular exercise should be initiated or continued. - Return to Sleep Center for re-evaluation after 10 weeks of therapy  [place], [place] [facility]  ELECTRONICALLY SIGNED ON:  12/16/2016, 8:15  PM [place] PH: [phone]   FX: [phone] [place]

## 2016-12-16 NOTE — Telephone Encounter (Signed)
  Follow up Call-  Call back number 12/15/2016  Post procedure Call Back phone  # [phone]  Permission to leave phone message Yes  Some recent data might be hidden     Patient questions:  Do you have a fever, pain , or abdominal swelling? No. Pain Score  0 *  Have you tolerated food without any problems? Yes.    Have you been able to return to your normal activities? Yes.    Do you have any questions about your discharge instructions: Diet   No. Medications  No. Follow up visit  No.  Do you have questions or concerns about your Care? No.  Actions: * If pain score is 4 or above: No action needed, pain <4.

## 2016-12-17 ENCOUNTER — Telehealth: Payer: Self-pay | Admitting: *Deleted

## 2016-12-17 NOTE — Telephone Encounter (Signed)
-----   Message from Sueanne Margarita, MD sent at 12/16/2016  8:35 PM EST ----- Please let patient know that they have significant sleep apnea and had successful CPAP titration and will be set up with CPAP unit.  Please let DME know that order is in EPIC.  Please set patient up for OV in 10 weeks

## 2016-12-17 NOTE — Telephone Encounter (Signed)
Informed patient of sleep study results and patient understanding was verbalized. Patient understands he has significant sleep apnea and had a successful CPAP Titration. Patient understands he stopped breathing 33.5/hour but was treated successfully with a pressure of 7 cm. Patient asked if he snored and was informed that he snored with soft snoring volume during the study. Patient asked if the test noted if he had restless legs syndrome and was informed that Clinically significant periodic limb movements did not occur during sleep. Patient understands he will be contacted by [facility] to set up his cpap. He understands to call if [facility] does not contact him with new setup in a timely manner. He understands he will be called once confirmation has been received from [facility] that he has received his new machine to schedule 10 week follow up appointment.  [facility] notified of new cpap order Please add to airview He thanked me for calling  Patient states he wanted his sleep study results on his mychart and wondered why the results were not loaded on his mychart automatically. Sleep assistant informed the patient that he can sign a release and he can get his results. Patient states he did not want to come to the office to sign a release.Patient was informed sleep assistant would ask medical records to assist in this matter. After speaking to medical records the sleep assistant informed the patient that the release could be faxed or emailed to him and once the form was completed his sleep study would be transferred into his mychart. The patient chose to have the form emailed to him at [email or web address]. sleep assistant gave his information to medical records.  Patient also wanted to contact Dr Radford Pax with other concerns surrounding his whole experience from the beginning until now and was informed he can reach her through sending a message to her in his mychart.

## 2016-12-21 ENCOUNTER — Encounter: Payer: Self-pay | Admitting: Gastroenterology

## 2016-12-22 ENCOUNTER — Telehealth: Payer: Self-pay | Admitting: Gastroenterology

## 2016-12-22 NOTE — Telephone Encounter (Signed)
Work note faxed as requested for 12/15/16 procedure. Pt aware

## 2016-12-31 DIAGNOSIS — G4733 Obstructive sleep apnea (adult) (pediatric): Secondary | ICD-10-CM | POA: Diagnosis not present

## 2017-01-07 NOTE — Telephone Encounter (Signed)
Informed patient of sleep study results and patient understanding was verbalized. Patient understands he has significant sleep apnea and had a successful CPAP Titration. Patient understands he stopped breathing 33.5/hour but was treated successfully with a pressure of 7 cm. Patient asked if he snored and was informed that he snored with soft snoring volume during the study. Patient asked if the test noted if he had restless legs syndrome and was informed that Clinically significant periodic limb movements did not occur during sleep. Patient understands he will be contacted by [facility] to set up his cpap. He understands to call if [facility] does not contact him with new setup in a timely manner. He understands he will be called once confirmation has been received from [facility] that he has received his new machine to schedule 10 week follow up appointment.  [facility] notified of new cpap order Please add to airview He thanked me for calling  Patient states he wanted his sleep study results on his mychart and wondered why the results were not loaded on his mychart automatically. Sleep assistant informed the patient that he can sign a release and he can get his results. Patient states he did not want to come to the office to sign a release.Patient was informed sleep assistant would ask medical records to assist in this matter. After speaking to medical records the sleep assistant informed the patient that the release could be faxed or emailed to him and once the form was completed his sleep study would be transferred into his mychart. The patient chose to have the form emailed to him at [email or web address]. sleep assistant gave his information to medical records.  Patient also wanted to contact Dr Radford Pax with other concerns surrounding his whole experience from the beginning until now and was informed he can reach her through sending a message to her in his mychart.

## 2017-01-31 DIAGNOSIS — G4733 Obstructive sleep apnea (adult) (pediatric): Secondary | ICD-10-CM | POA: Diagnosis not present

## 2017-03-03 DIAGNOSIS — G4733 Obstructive sleep apnea (adult) (pediatric): Secondary | ICD-10-CM | POA: Diagnosis not present

## 2017-03-12 DIAGNOSIS — R05 Cough: Secondary | ICD-10-CM | POA: Diagnosis not present

## 2017-03-17 ENCOUNTER — Other Ambulatory Visit: Payer: Self-pay | Admitting: Internal Medicine

## 2017-03-17 ENCOUNTER — Other Ambulatory Visit: Payer: Self-pay | Admitting: Physician Assistant

## 2017-03-17 MED ORDER — TADALAFIL 20 MG PO TABS: ORAL_TABLET | ORAL | 99 refills | Status: DC

## 2017-03-31 DIAGNOSIS — G4733 Obstructive sleep apnea (adult) (pediatric): Secondary | ICD-10-CM | POA: Diagnosis not present

## 2017-08-03 ENCOUNTER — Other Ambulatory Visit: Payer: Self-pay | Admitting: Adult Health

## 2017-08-17 DIAGNOSIS — M9902 Segmental and somatic dysfunction of thoracic region: Secondary | ICD-10-CM | POA: Diagnosis not present

## 2017-08-17 DIAGNOSIS — M9903 Segmental and somatic dysfunction of lumbar region: Secondary | ICD-10-CM | POA: Diagnosis not present

## 2017-08-17 DIAGNOSIS — M5384 Other specified dorsopathies, thoracic region: Secondary | ICD-10-CM | POA: Diagnosis not present

## 2017-08-20 NOTE — Progress Notes (Signed)
Patient ID: Michael Kim, male   DOB: [birth year], 47 y.o.   MRN: [id number] CPE  Assessment and Plan:  Hypertension:  -Continue medication,  -monitor blood pressure at home.  -Continue DASH diet.   -Reminder to go to the ER if any CP, SOB, nausea, dizziness, severe HA, changes vision/speech, left arm numbness and tingling, and jaw pain.  Cholesterol: -Continue diet and exercise.  -Check cholesterol.  -was taken off medication 4 months ago  Vitamin D Def: -check level -continue medications.   Atrial  Fibrillation -seems to be in NSR - follow up cardio - get back on bASA  Vitamin D deficiency Continue supplement  Testosterone deficiency Increase exercise, add zinc, check levels  Routine general medical examination at a health care facility Due next year Follows cardio, no need for EKG   Continue diet and meds as discussed. Further disposition pending results of labs. Future Appointments  Date Time Provider [place]  08/23/2018  3:00 PM Vicie Mutters, PA-C [facility] None    HPI 47 y.o. male  presents for 3 month follow up with hypertension, hyperlipidemia, prediabetes and vitamin D.   His blood pressure has been controlled at home, today their BP is BP: 126/80.   He does workout. He denies chest pain, shortness of breath, dizziness. He has history of Afib, folllows cardio CHADSVAC 1, on bASA.  He is on cholesterol medication BUT HAS BEEN OFF, will get back on and denies myalgias. His cholesterol is at goal. The cholesterol last visit was:   Lab Results  Component Value Date   CHOL 226 (H) 08/11/2016   HDL 86 08/11/2016   LDLCALC 85 08/11/2016   TRIG 277 (H) 08/11/2016   CHOLHDL 2.6 08/11/2016    He has been working on diet and exercise for prediabetes, and denies foot ulcerations, hyperglycemia, hypoglycemia , increased appetite, nausea, paresthesia of the feet, polydipsia, polyuria, visual disturbances, vomiting and weight loss. Last A1C in the office  was:  Lab Results  Component Value Date   HGBA1C 5.1 08/11/2016   Patient is on Vitamin D supplement.  Lab Results  Component Value Date   VD25OH 46 08/11/2016     BMI is Body mass index is 27.67 kg/m., he is working on diet and exercise. Wt Readings from Last 3 Encounters:  08/22/17 204 lb (92.5 kg)  12/15/16 215 lb (97.5 kg)  12/01/16 215 lb (97.5 kg)   He has a history of testosterone deficiency and is on testosterone replacement. He states that the testosterone helps with his energy, libido, muscle mass. Lab Results  Component Value Date   TESTOSTERONE 173 (L) 06/25/2015    Current Medications:  Current Outpatient Medications on File Prior to Visit  Medication Sig Dispense Refill  . Psyllium (METAMUCIL FIBER PO) Take 1 Dose by mouth daily.    . rosuvastatin (CRESTOR) 10 MG tablet TAKE 1/2 TO 1 TABLET BY MOUTH DAILY AS DIRECTED FOR CHOLESTEROL 45 tablet 0  . tadalafil (CIALIS) 20 MG tablet Take 1/2 to 1 tablet every 2 to 3 days if needed for [name] 6 tablet 99   Current Facility-Administered Medications on File Prior to Visit  Medication Dose Route Frequency Provider Last Rate Last Dose  . 0.9 %  sodium chloride infusion  500 mL Intravenous Once Milus Banister, MD        Medical History:  Past Medical History:  Diagnosis Date  . Atrial fibrillation with rapid ventricular response ([place])   . GERD (gastroesophageal reflux disease)   .  Hyperlipidemia   . Other testicular hypofunction   . Unspecified vitamin D deficiency    Immunization History  Administered Date(s) Administered  . PPD Test 04/03/2013  . Td 11/08/2005  . Tdap 06/25/2015   Tetanus: 2017 Colonoscopy: 12/2016, FM HX q 5 years Eye Exam: 2017 Dentist: Twice yearly  Patient Care Team: Unk Pinto, MD as PCP - General (Internal Medicine) Milus Banister, MD as Attending Physician (Gastroenterology) Thornell Sartorius, MD as Consulting Physician (Otolaryngology) Willow Ora (Optometry) Justice Britain, MD as Consulting Physician (Orthopedic Surgery) Newman Pies, MD as Consulting Physician (Neurosurgery)  Review of Systems:  Review of Systems  Constitutional: Negative for chills, fever and malaise/fatigue.  HENT: Negative for congestion, ear discharge and sore throat.   Eyes: Negative.   Respiratory: Negative for cough, shortness of breath and wheezing.   Cardiovascular: Negative for chest pain, palpitations and leg swelling.  Gastrointestinal: Negative for abdominal pain, blood in stool, constipation, diarrhea, heartburn and melena.  Genitourinary: Negative.   Skin: Negative.   Neurological: Negative for headaches.   Allergies Allergies  Allergen Reactions  . Pravachol [Pravastatin] Other (See Comments)    Fatigue    SURGICAL HISTORY He  has a past surgical history that includes Shoulder surgery (2011) and Colonoscopy. FAMILY HISTORY His family history includes Cancer in his mother; Cancer (age of onset: 38) in his maternal grandmother; Colon cancer (age of onset: 58) in his paternal uncle; Colon polyps in his father; Depression in his maternal grandmother; Heart disease in his paternal grandfather; Hyperlipidemia in his father; Lymphoma in his mother. SOCIAL HISTORY He  reports that he has been smoking cigars. He has never used smokeless tobacco. He reports that he drinks about 21.0 standard drinks of alcohol per week. He reports that he does not use drugs. Drinks 4 days a week, has cut back.   Physical Exam: BP 126/80   Pulse 78   Temp 98 F (36.7 C)   Resp 12   Ht 6' (1.829 m)   Wt 204 lb (92.5 kg)   SpO2 98%   BMI 27.67 kg/m  Wt Readings from Last 3 Encounters:  08/22/17 204 lb (92.5 kg)  12/15/16 215 lb (97.5 kg)  12/01/16 215 lb (97.5 kg)    General Appearance: Well nourished well developed, in no apparent distress. Eyes: PERRLA, EOMs, conjunctiva no swelling or erythema ENT/Mouth: Ear canals normal without obstruction, swelling, erythma,  discharge.  TMs normal bilaterally.  Oropharynx moist, clear, without exudate, or postoropharyngeal swelling. Neck: Supple, thyroid normal,no cervical adenopathy  Respiratory: Respiratory effort normal, Breath sounds clear A&P without rhonchi, wheeze, or rale.  No retractions, no accessory usage. Cardio: RRR with no MRGs. Brisk peripheral pulses without edema.  Abdomen: Soft, + BS,  Non tender, no guarding, rebound, hernias, masses. Musculoskeletal: Full ROM, 5/5 strength, Normal gait,  right medial ankle some erythema, swelling, No warmth, no distal edema.  Skin: Warm, dry without rashes, lesions, ecchymosis.  Neuro: Awake and oriented X 3, Cranial nerves intact. Normal muscle tone, no cerebellar symptoms. Psych: Normal affect, Insight and Judgment appropriate.   EKG Defer cardio Aorta Defer due to age  Vicie Mutters, PA-C 3:29 PM [facility]

## 2017-08-22 ENCOUNTER — Encounter: Payer: Self-pay | Admitting: Physician Assistant

## 2017-08-22 ENCOUNTER — Ambulatory Visit (INDEPENDENT_AMBULATORY_CARE_PROVIDER_SITE_OTHER): Payer: Commercial Managed Care - PPO | Admitting: Physician Assistant

## 2017-08-22 VITALS — BP 126/80 | HR 78 | Temp 98.0°F | Resp 12 | Ht 72.0 in | Wt 204.0 lb

## 2017-08-22 DIAGNOSIS — E782 Mixed hyperlipidemia: Secondary | ICD-10-CM

## 2017-08-22 DIAGNOSIS — Z9989 Dependence on other enabling machines and devices: Secondary | ICD-10-CM | POA: Insufficient documentation

## 2017-08-22 DIAGNOSIS — Z6827 Body mass index (BMI) 27.0-27.9, adult: Secondary | ICD-10-CM

## 2017-08-22 DIAGNOSIS — Z79899 Other long term (current) drug therapy: Secondary | ICD-10-CM

## 2017-08-22 DIAGNOSIS — R7309 Other abnormal glucose: Secondary | ICD-10-CM | POA: Diagnosis not present

## 2017-08-22 DIAGNOSIS — M5386 Other specified dorsopathies, lumbar region: Secondary | ICD-10-CM | POA: Diagnosis not present

## 2017-08-22 DIAGNOSIS — G4733 Obstructive sleep apnea (adult) (pediatric): Secondary | ICD-10-CM | POA: Insufficient documentation

## 2017-08-22 DIAGNOSIS — I480 Paroxysmal atrial fibrillation: Secondary | ICD-10-CM

## 2017-08-22 DIAGNOSIS — Z0000 Encounter for general adult medical examination without abnormal findings: Secondary | ICD-10-CM

## 2017-08-22 DIAGNOSIS — I10 Essential (primary) hypertension: Secondary | ICD-10-CM

## 2017-08-22 DIAGNOSIS — M9905 Segmental and somatic dysfunction of pelvic region: Secondary | ICD-10-CM | POA: Diagnosis not present

## 2017-08-22 DIAGNOSIS — Z800 Family history of malignant neoplasm of digestive organs: Secondary | ICD-10-CM

## 2017-08-22 DIAGNOSIS — M9903 Segmental and somatic dysfunction of lumbar region: Secondary | ICD-10-CM | POA: Diagnosis not present

## 2017-08-22 DIAGNOSIS — E349 Endocrine disorder, unspecified: Secondary | ICD-10-CM

## 2017-08-22 DIAGNOSIS — E559 Vitamin D deficiency, unspecified: Secondary | ICD-10-CM

## 2017-08-22 DIAGNOSIS — I4891 Unspecified atrial fibrillation: Secondary | ICD-10-CM

## 2017-08-22 MED ORDER — VALACYCLOVIR HCL 500 MG PO TABS: 500.0000 mg | ORAL_TABLET | Freq: Two times a day (BID) | ORAL | 1 refills | Status: DC

## 2017-08-22 NOTE — Patient Instructions (Signed)

## 2017-08-23 LAB — CBC WITH DIFFERENTIAL/PLATELET
BASOS PCT: 0.5 %
Basophils Absolute: 31 cells/uL (ref 0–200)
EOS PCT: 2.1 %
Eosinophils Absolute: 128 cells/uL (ref 15–500)
HCT: 43.9 % (ref 38.5–50.0)
Hemoglobin: 15.1 g/dL (ref 13.2–17.1)
Lymphs Abs: 1903 cells/uL (ref 850–3900)
MCH: 32.6 pg (ref 27.0–33.0)
MCHC: 34.4 g/dL (ref 32.0–36.0)
MCV: 94.8 fL (ref 80.0–100.0)
MONOS PCT: 12.3 %
MPV: 10.8 fL (ref 7.5–12.5)
NEUTROS PCT: 53.9 %
Neutro Abs: 3288 cells/uL (ref 1500–7800)
PLATELETS: 149 10*3/uL (ref 140–400)
RBC: 4.63 10*6/uL (ref 4.20–5.80)
RDW: 11.5 % (ref 11.0–15.0)
TOTAL LYMPHOCYTE: 31.2 %
WBC: 6.1 10*3/uL (ref 3.8–10.8)
WBCMIX: 750 {cells}/uL (ref 200–950)

## 2017-08-23 LAB — URINALYSIS, ROUTINE W REFLEX MICROSCOPIC
BILIRUBIN URINE: NEGATIVE
GLUCOSE, UA: NEGATIVE
HGB URINE DIPSTICK: NEGATIVE
KETONES UR: NEGATIVE
LEUKOCYTES UA: NEGATIVE
Nitrite: NEGATIVE
PH: 5.5 (ref 5.0–8.0)
Protein, ur: NEGATIVE
Specific Gravity, Urine: 1.026 (ref 1.001–1.03)

## 2017-08-23 LAB — COMPLETE METABOLIC PANEL WITH GFR
AG Ratio: 2 (calc) (ref 1.0–2.5)
ALBUMIN MSPROF: 4.6 g/dL (ref 3.6–5.1)
ALKALINE PHOSPHATASE (APISO): 58 U/L (ref 40–115)
ALT: 22 U/L (ref 9–46)
AST: 21 U/L (ref 10–40)
BUN: 19 mg/dL (ref 7–25)
CHLORIDE: 103 mmol/L (ref 98–110)
CO2: 29 mmol/L (ref 20–32)
Calcium: 9.9 mg/dL (ref 8.6–10.3)
Creat: 0.88 mg/dL (ref 0.60–1.35)
GFR, Est African American: 119 mL/min/{1.73_m2} (ref >=60)
GFR, Est Non African American: 103 mL/min/{1.73_m2} (ref >=60)
GLOBULIN: 2.3 g/dL (ref 1.9–3.7)
GLUCOSE: 99 mg/dL (ref 65–99)
Potassium: 4.6 mmol/L (ref 3.5–5.3)
SODIUM: 138 mmol/L (ref 135–146)
Total Bilirubin: 0.6 mg/dL (ref 0.2–1.2)
Total Protein: 6.9 g/dL (ref 6.1–8.1)

## 2017-08-23 LAB — HEMOGLOBIN A1C
Hgb A1c MFr Bld: 5.2 % of total Hgb (ref <5.7)
Mean Plasma Glucose: 103 (calc)
eAG (mmol/L): 5.7 (calc)

## 2017-08-23 LAB — LIPID PANEL
CHOLESTEROL: 266 mg/dL — AB (ref <200)
HDL: 85 mg/dL (ref >40)
LDL Cholesterol (Calc): 147 mg/dL (calc) — ABNORMAL HIGH
Non-HDL Cholesterol (Calc): 181 mg/dL (calc) — ABNORMAL HIGH (ref <130)
Total CHOL/HDL Ratio: 3.1 (calc) (ref <5.0)
Triglycerides: 203 mg/dL — ABNORMAL HIGH (ref <150)

## 2017-08-23 LAB — MICROALBUMIN / CREATININE URINE RATIO
Creatinine, Urine: 173 mg/dL (ref 20–320)
Microalb Creat Ratio: 3 mcg/mg creat (ref <30)
Microalb, Ur: 0.5 mg/dL

## 2017-08-23 LAB — TSH: TSH: 0.85 mIU/L (ref 0.40–4.50)

## 2017-08-23 LAB — VITAMIN D 25 HYDROXY (VIT D DEFICIENCY, FRACTURES): VIT D 25 HYDROXY: 34 ng/mL (ref 30–100)

## 2017-08-23 LAB — MAGNESIUM: Magnesium: 2 mg/dL (ref 1.5–2.5)

## 2017-08-23 LAB — TESTOSTERONE: TESTOSTERONE: 256 ng/dL (ref 250–827)

## 2017-08-24 DIAGNOSIS — M5386 Other specified dorsopathies, lumbar region: Secondary | ICD-10-CM | POA: Diagnosis not present

## 2017-08-24 DIAGNOSIS — M9903 Segmental and somatic dysfunction of lumbar region: Secondary | ICD-10-CM | POA: Diagnosis not present

## 2017-08-24 DIAGNOSIS — M9905 Segmental and somatic dysfunction of pelvic region: Secondary | ICD-10-CM | POA: Diagnosis not present

## 2017-08-26 DIAGNOSIS — M9903 Segmental and somatic dysfunction of lumbar region: Secondary | ICD-10-CM | POA: Diagnosis not present

## 2017-08-26 DIAGNOSIS — M9905 Segmental and somatic dysfunction of pelvic region: Secondary | ICD-10-CM | POA: Diagnosis not present

## 2017-08-26 DIAGNOSIS — M5386 Other specified dorsopathies, lumbar region: Secondary | ICD-10-CM | POA: Diagnosis not present

## 2017-08-29 DIAGNOSIS — M9903 Segmental and somatic dysfunction of lumbar region: Secondary | ICD-10-CM | POA: Diagnosis not present

## 2017-08-29 DIAGNOSIS — M5386 Other specified dorsopathies, lumbar region: Secondary | ICD-10-CM | POA: Diagnosis not present

## 2017-08-29 DIAGNOSIS — M9905 Segmental and somatic dysfunction of pelvic region: Secondary | ICD-10-CM | POA: Diagnosis not present

## 2017-08-30 DIAGNOSIS — G4733 Obstructive sleep apnea (adult) (pediatric): Secondary | ICD-10-CM | POA: Diagnosis not present

## 2017-09-01 DIAGNOSIS — M5386 Other specified dorsopathies, lumbar region: Secondary | ICD-10-CM | POA: Diagnosis not present

## 2017-09-01 DIAGNOSIS — M9903 Segmental and somatic dysfunction of lumbar region: Secondary | ICD-10-CM | POA: Diagnosis not present

## 2017-09-01 DIAGNOSIS — M9905 Segmental and somatic dysfunction of pelvic region: Secondary | ICD-10-CM | POA: Diagnosis not present

## 2017-09-07 DIAGNOSIS — M9903 Segmental and somatic dysfunction of lumbar region: Secondary | ICD-10-CM | POA: Diagnosis not present

## 2017-09-07 DIAGNOSIS — M9905 Segmental and somatic dysfunction of pelvic region: Secondary | ICD-10-CM | POA: Diagnosis not present

## 2017-09-07 DIAGNOSIS — M5386 Other specified dorsopathies, lumbar region: Secondary | ICD-10-CM | POA: Diagnosis not present

## 2017-09-09 DIAGNOSIS — M5386 Other specified dorsopathies, lumbar region: Secondary | ICD-10-CM | POA: Diagnosis not present

## 2017-09-09 DIAGNOSIS — M9905 Segmental and somatic dysfunction of pelvic region: Secondary | ICD-10-CM | POA: Diagnosis not present

## 2017-09-09 DIAGNOSIS — M9903 Segmental and somatic dysfunction of lumbar region: Secondary | ICD-10-CM | POA: Diagnosis not present

## 2017-09-19 DIAGNOSIS — M9905 Segmental and somatic dysfunction of pelvic region: Secondary | ICD-10-CM | POA: Diagnosis not present

## 2017-09-19 DIAGNOSIS — M9903 Segmental and somatic dysfunction of lumbar region: Secondary | ICD-10-CM | POA: Diagnosis not present

## 2017-09-19 DIAGNOSIS — M5386 Other specified dorsopathies, lumbar region: Secondary | ICD-10-CM | POA: Diagnosis not present

## 2017-09-22 DIAGNOSIS — M9903 Segmental and somatic dysfunction of lumbar region: Secondary | ICD-10-CM | POA: Diagnosis not present

## 2017-09-22 DIAGNOSIS — M9905 Segmental and somatic dysfunction of pelvic region: Secondary | ICD-10-CM | POA: Diagnosis not present

## 2017-09-22 DIAGNOSIS — M5386 Other specified dorsopathies, lumbar region: Secondary | ICD-10-CM | POA: Diagnosis not present

## 2017-09-26 DIAGNOSIS — M9903 Segmental and somatic dysfunction of lumbar region: Secondary | ICD-10-CM | POA: Diagnosis not present

## 2017-09-26 DIAGNOSIS — M5386 Other specified dorsopathies, lumbar region: Secondary | ICD-10-CM | POA: Diagnosis not present

## 2017-09-26 DIAGNOSIS — M9905 Segmental and somatic dysfunction of pelvic region: Secondary | ICD-10-CM | POA: Diagnosis not present

## 2017-10-10 DIAGNOSIS — M9905 Segmental and somatic dysfunction of pelvic region: Secondary | ICD-10-CM | POA: Diagnosis not present

## 2017-10-10 DIAGNOSIS — M5386 Other specified dorsopathies, lumbar region: Secondary | ICD-10-CM | POA: Diagnosis not present

## 2017-10-10 DIAGNOSIS — M9903 Segmental and somatic dysfunction of lumbar region: Secondary | ICD-10-CM | POA: Diagnosis not present

## 2017-10-24 DIAGNOSIS — M5386 Other specified dorsopathies, lumbar region: Secondary | ICD-10-CM | POA: Diagnosis not present

## 2017-10-24 DIAGNOSIS — M9905 Segmental and somatic dysfunction of pelvic region: Secondary | ICD-10-CM | POA: Diagnosis not present

## 2017-10-24 DIAGNOSIS — M9903 Segmental and somatic dysfunction of lumbar region: Secondary | ICD-10-CM | POA: Diagnosis not present

## 2017-10-25 ENCOUNTER — Other Ambulatory Visit: Payer: Self-pay | Admitting: Adult Health

## 2017-11-02 ENCOUNTER — Other Ambulatory Visit: Payer: Self-pay | Admitting: Internal Medicine

## 2017-11-14 DIAGNOSIS — M9903 Segmental and somatic dysfunction of lumbar region: Secondary | ICD-10-CM | POA: Diagnosis not present

## 2017-11-14 DIAGNOSIS — M5386 Other specified dorsopathies, lumbar region: Secondary | ICD-10-CM | POA: Diagnosis not present

## 2017-11-14 DIAGNOSIS — M9905 Segmental and somatic dysfunction of pelvic region: Secondary | ICD-10-CM | POA: Diagnosis not present

## 2017-12-12 DIAGNOSIS — M9905 Segmental and somatic dysfunction of pelvic region: Secondary | ICD-10-CM | POA: Diagnosis not present

## 2017-12-12 DIAGNOSIS — M5386 Other specified dorsopathies, lumbar region: Secondary | ICD-10-CM | POA: Diagnosis not present

## 2017-12-12 DIAGNOSIS — M9903 Segmental and somatic dysfunction of lumbar region: Secondary | ICD-10-CM | POA: Diagnosis not present

## 2018-02-06 DIAGNOSIS — M5386 Other specified dorsopathies, lumbar region: Secondary | ICD-10-CM | POA: Diagnosis not present

## 2018-02-06 DIAGNOSIS — M9905 Segmental and somatic dysfunction of pelvic region: Secondary | ICD-10-CM | POA: Diagnosis not present

## 2018-02-06 DIAGNOSIS — M9903 Segmental and somatic dysfunction of lumbar region: Secondary | ICD-10-CM | POA: Diagnosis not present

## 2018-08-21 NOTE — Progress Notes (Signed)
Patient ID: Michael Kim, male   DOB: 1970-10-20, 48 y.o.   MRN: [id number] CPE  Assessment and Plan:   Atrial fibrillation with rapid ventricular response ([place]) Stay on CPAP, avoid ETOH and caffeine.  Continue follow up cardiology  Hypertension, unspecified type -     CBC with Differential/Platelet -     COMPLETE METABOLIC PANEL WITH GFR -     TSH -     Urinalysis, Routine w reflex microscopic -     Microalbumin / creatinine urine ratio - continue medications, DASH diet, exercise and monitor at home. Call if greater than 130/80.   Mixed hyperlipidemia -     rosuvastatin (CRESTOR) 10 MG tablet; 1 TABLET BY MOUTH DAILY WHENEVER YOU CAN FOR CHOLESTEROL -     Lipid panel check lipids decrease fatty foods increase activity.   Medication management -     Magnesium  Testosterone deficiency -     tadalafil (CIALIS) 20 MG tablet; Take 1/2 to 1 tablet every 2 to 3 days as needed for [name] -     Testosterone  Vitamin D deficiency -     VITAMIN D 25 Hydroxy (Vit-D Deficiency, Fractures)  OSA on CPAP Sleep apnea- continue CPAP, CPAP is helping with daytime fatigue, weight loss still advised.   Family history of colon cancer q 5 years  Encounter for general adult medical examination with abnormal findings 1 year  Rectal bleeding Get squatty potty  Acute low back pain without sciatica, unspecified back pain laterality -     cyclobenzaprine (FLEXERIL) 10 MG tablet; Take 1 tablet (10 mg total) by mouth 3 (three) times daily as needed for muscle spasms.      Continue diet and meds as discussed. Further disposition pending results of labs. Future Appointments  Date Time Provider [place]  08/27/2019  3:00 PM Vicie Mutters, PA-C [facility] None    HPI 48 y.o. male  presents for 3 month follow up with hypertension, hyperlipidemia, prediabetes and vitamin D.  Teaches golf.    His blood pressure has been controlled at home, today their BP is BP: 122/80.   He does  workout. He denies chest pain, shortness of breath, dizziness. He has history of Afib, folllows cardio CHADSVAC 1, on bASA. He is on CPAP x 1 year, states it helps with his sleep and energy.   He is on cholesterol medication BUT HAS BEEN OFF, can not remember to take it, will get back on and denies myalgias. His cholesterol is at goal. The cholesterol last visit was:   Lab Results  Component Value Date   CHOL 266 (H) 08/22/2017   HDL 85 08/22/2017   LDLCALC 147 (H) 08/22/2017   TRIG 203 (H) 08/22/2017   CHOLHDL 3.1 08/22/2017    He has been working on diet and exercise for prediabetes, and denies foot ulcerations, hyperglycemia, hypoglycemia , increased appetite, nausea, paresthesia of the feet, polydipsia, polyuria, visual disturbances, vomiting and weight loss. Last A1C in the office was:  Lab Results  Component Value Date   HGBA1C 5.2 08/22/2017   Patient is on Vitamin D supplement.  Lab Results  Component Value Date   VD25OH 34 08/22/2017     BMI is Body mass index is 29.03 kg/m., he is working on diet and exercise. Wt Readings from Last 3 Encounters:  08/23/18 217 lb (98.4 kg)  08/22/17 204 lb (92.5 kg)  12/15/16 215 lb (97.5 kg)   He has a history of testosterone deficiency and is on testosterone  replacement. He is on cialis occasionally Lab Results  Component Value Date   TESTOSTERONE 256 08/22/2017    Current Medications:  Current Outpatient Medications on File Prior to Visit  Medication Sig Dispense Refill  . Psyllium (METAMUCIL FIBER PO) Take 1 Dose by mouth daily.    . rosuvastatin (CRESTOR) 10 MG tablet TAKE 1/2 TO 1 TABLET BY MOUTH DAILY AS DIRECTED FOR CHOLESTEROL 45 tablet 0  . tadalafil (ADCIRCA/CIALIS) 20 MG tablet Take 1/2 to 1 tablet every 2 to 3 days as needed for [name] 30 tablet 5   Current Facility-Administered Medications on File Prior to Visit  Medication Dose Route Frequency Provider Last Rate Last Dose  . 0.9 %  sodium chloride infusion  500 mL  Intravenous Once Milus Banister, MD        Medical History:  Past Medical History:  Diagnosis Date  . Atrial fibrillation with rapid ventricular response ([place])   . GERD (gastroesophageal reflux disease)   . Hyperlipidemia   . Other testicular hypofunction   . Unspecified vitamin D deficiency    Immunization History  Administered Date(s) Administered  . PPD Test 04/03/2013  . Td 11/08/2005  . Tdap 06/25/2015   Tetanus: 2017 Colonoscopy: 12/2016, FM HX q 5 years due 2023 Eye Exam: 2017 Dentist: Twice yearly  Patient Care Team: Unk Pinto, MD as PCP - General (Internal Medicine) Milus Banister, MD as Attending Physician (Gastroenterology) Thornell Sartorius, MD as Consulting Physician (Otolaryngology) Willow Ora (Optometry) Justice Britain, MD as Consulting Physician (Orthopedic Surgery) Newman Pies, MD as Consulting Physician (Neurosurgery)  Review of Systems:  Review of Systems  Constitutional: Negative for chills, fever and malaise/fatigue.  HENT: Negative for congestion, ear discharge and sore throat.   Eyes: Negative.   Respiratory: Negative for cough, shortness of breath and wheezing.   Cardiovascular: Negative for chest pain, palpitations and leg swelling.  Gastrointestinal: Negative for abdominal pain, blood in stool, constipation, diarrhea, heartburn and melena.  Genitourinary: Negative.   Skin: Negative.   Neurological: Negative for headaches.   Allergies Allergies  Allergen Reactions  . Pravachol [Pravastatin] Other (See Comments)    Fatigue    SURGICAL HISTORY He  has a past surgical history that includes Shoulder surgery (2011) and Colonoscopy. FAMILY HISTORY His family history includes Cancer in his mother; Cancer (age of onset: 5) in his maternal grandmother; Colon cancer (age of onset: 70) in his paternal uncle; Colon polyps in his father; Depression in his maternal grandmother; Heart disease in his paternal grandfather; Hyperlipidemia  in his father; Lymphoma in his mother. SOCIAL HISTORY He  reports that he has been smoking cigars. He has never used smokeless tobacco. He reports current alcohol use of about 21.0 standard drinks of alcohol per week. He reports that he does not use drugs. Drinks 4 days a week, has cut back.   Physical Exam: BP 122/80   Pulse 62   Temp (!) 97.5 F (36.4 C)   Ht 6' 0.5" (1.842 m)   Wt 217 lb (98.4 kg)   SpO2 95%   BMI 29.03 kg/m  Wt Readings from Last 3 Encounters:  08/23/18 217 lb (98.4 kg)  08/22/17 204 lb (92.5 kg)  12/15/16 215 lb (97.5 kg)   General Appearance: Well nourished well developed, in no apparent distress. Eyes: PERRLA, EOMs, conjunctiva no swelling or erythema ENT/Mouth: Ear canals normal without obstruction, swelling, erythma, discharge.  TMs normal bilaterally.  Oropharynx moist, clear, without exudate, or postoropharyngeal swelling. Neck: Supple, thyroid normal,no  cervical adenopathy  Respiratory: Respiratory effort normal, Breath sounds clear A&P without rhonchi, wheeze, or rale.  No retractions, no accessory usage. Cardio: RRR with no MRGs. Brisk peripheral pulses without edema.  Abdomen: Soft, + BS,  Non tender, no guarding, rebound, hernias, masses. Musculoskeletal: Full ROM, 5/5 strength, Normal gait,  right medial ankle some erythema, swelling, No warmth, no distal edema.  Skin: Warm, dry without rashes, lesions, ecchymosis.  Neuro: Awake and oriented X 3, Cranial nerves intact. Normal muscle tone, no cerebellar symptoms. Psych: Normal affect, Insight and Judgment appropriate.   EKG Defer cardio Aorta Defer due to age  Vicie Mutters, PA-C 3:24 PM [facility]

## 2018-08-23 ENCOUNTER — Other Ambulatory Visit: Payer: Self-pay

## 2018-08-23 ENCOUNTER — Encounter: Payer: Self-pay | Admitting: Physician Assistant

## 2018-08-23 ENCOUNTER — Ambulatory Visit: Payer: Commercial Managed Care - PPO | Admitting: Physician Assistant

## 2018-08-23 VITALS — BP 122/80 | HR 62 | Temp 97.5°F | Ht 72.5 in | Wt 217.0 lb

## 2018-08-23 DIAGNOSIS — E559 Vitamin D deficiency, unspecified: Secondary | ICD-10-CM

## 2018-08-23 DIAGNOSIS — M545 Low back pain, unspecified: Secondary | ICD-10-CM

## 2018-08-23 DIAGNOSIS — E782 Mixed hyperlipidemia: Secondary | ICD-10-CM

## 2018-08-23 DIAGNOSIS — Z0000 Encounter for general adult medical examination without abnormal findings: Secondary | ICD-10-CM | POA: Diagnosis not present

## 2018-08-23 DIAGNOSIS — Z800 Family history of malignant neoplasm of digestive organs: Secondary | ICD-10-CM

## 2018-08-23 DIAGNOSIS — I10 Essential (primary) hypertension: Secondary | ICD-10-CM

## 2018-08-23 DIAGNOSIS — Z9989 Dependence on other enabling machines and devices: Secondary | ICD-10-CM

## 2018-08-23 DIAGNOSIS — Z79899 Other long term (current) drug therapy: Secondary | ICD-10-CM

## 2018-08-23 DIAGNOSIS — Z0001 Encounter for general adult medical examination with abnormal findings: Secondary | ICD-10-CM

## 2018-08-23 DIAGNOSIS — K625 Hemorrhage of anus and rectum: Secondary | ICD-10-CM

## 2018-08-23 DIAGNOSIS — I4891 Unspecified atrial fibrillation: Secondary | ICD-10-CM

## 2018-08-23 DIAGNOSIS — E349 Endocrine disorder, unspecified: Secondary | ICD-10-CM

## 2018-08-23 DIAGNOSIS — G4733 Obstructive sleep apnea (adult) (pediatric): Secondary | ICD-10-CM

## 2018-08-23 MED ORDER — TADALAFIL 20 MG PO TABS: ORAL_TABLET | ORAL | 5 refills | Status: DC

## 2018-08-23 MED ORDER — ROSUVASTATIN CALCIUM 10 MG PO TABS: ORAL_TABLET | ORAL | 1 refills | Status: DC

## 2018-08-23 MED ORDER — CYCLOBENZAPRINE HCL 10 MG PO TABS: 10.0000 mg | ORAL_TABLET | Freq: Three times a day (TID) | ORAL | 0 refills | Status: DC | PRN

## 2018-08-24 LAB — URINALYSIS, ROUTINE W REFLEX MICROSCOPIC
Bilirubin Urine: NEGATIVE
Glucose, UA: NEGATIVE
Hgb urine dipstick: NEGATIVE
Ketones, ur: NEGATIVE
Leukocytes,Ua: NEGATIVE
Nitrite: NEGATIVE
Protein, ur: NEGATIVE
Specific Gravity, Urine: 1.025 (ref 1.001–1.03)
pH: <=5 (ref 5.0–8.0)

## 2018-08-24 LAB — CBC WITH DIFFERENTIAL/PLATELET
Absolute Monocytes: 756 cells/uL (ref 200–950)
Basophils Absolute: 43 cells/uL (ref 0–200)
Basophils Relative: 0.6 %
Eosinophils Absolute: 194 cells/uL (ref 15–500)
Eosinophils Relative: 2.7 %
HCT: 44.8 % (ref 38.5–50.0)
Hemoglobin: 15.3 g/dL (ref 13.2–17.1)
Lymphs Abs: 2470 cells/uL (ref 850–3900)
MCH: 32.6 pg (ref 27.0–33.0)
MCHC: 34.2 g/dL (ref 32.0–36.0)
MCV: 95.5 fL (ref 80.0–100.0)
MPV: 10.8 fL (ref 7.5–12.5)
Monocytes Relative: 10.5 %
Neutro Abs: 3737 cells/uL (ref 1500–7800)
Neutrophils Relative %: 51.9 %
Platelets: 162 10*3/uL (ref 140–400)
RBC: 4.69 10*6/uL (ref 4.20–5.80)
RDW: 12.3 % (ref 11.0–15.0)
Total Lymphocyte: 34.3 %
WBC: 7.2 10*3/uL (ref 3.8–10.8)

## 2018-08-24 LAB — TESTOSTERONE: Testosterone: 155 ng/dL — ABNORMAL LOW (ref 250–827)

## 2018-08-24 LAB — LIPID PANEL
Cholesterol: 315 mg/dL — ABNORMAL HIGH (ref <200)
HDL: 66 mg/dL (ref >=40)
LDL Cholesterol (Calc): 186 mg/dL (calc) — ABNORMAL HIGH
Non-HDL Cholesterol (Calc): 249 mg/dL (calc) — ABNORMAL HIGH (ref <130)
Total CHOL/HDL Ratio: 4.8 (calc) (ref <5.0)
Triglycerides: 373 mg/dL — ABNORMAL HIGH (ref <150)

## 2018-08-24 LAB — COMPLETE METABOLIC PANEL WITH GFR
AG Ratio: 1.8 (calc) (ref 1.0–2.5)
ALT: 22 U/L (ref 9–46)
AST: 19 U/L (ref 10–40)
Albumin: 4.4 g/dL (ref 3.6–5.1)
Alkaline phosphatase (APISO): 55 U/L (ref 36–130)
BUN: 23 mg/dL (ref 7–25)
CO2: 24 mmol/L (ref 20–32)
Calcium: 10 mg/dL (ref 8.6–10.3)
Chloride: 101 mmol/L (ref 98–110)
Creat: 0.8 mg/dL (ref 0.60–1.35)
GFR, Est African American: 123 mL/min/{1.73_m2} (ref >=60)
GFR, Est Non African American: 106 mL/min/{1.73_m2} (ref >=60)
Globulin: 2.4 g/dL (calc) (ref 1.9–3.7)
Glucose, Bld: 101 mg/dL — ABNORMAL HIGH (ref 65–99)
Potassium: 4 mmol/L (ref 3.5–5.3)
Sodium: 135 mmol/L (ref 135–146)
Total Bilirubin: 0.4 mg/dL (ref 0.2–1.2)
Total Protein: 6.8 g/dL (ref 6.1–8.1)

## 2018-08-24 LAB — VITAMIN D 25 HYDROXY (VIT D DEFICIENCY, FRACTURES): Vit D, 25-Hydroxy: 29 ng/mL — ABNORMAL LOW (ref 30–100)

## 2018-08-24 LAB — MICROALBUMIN / CREATININE URINE RATIO
Creatinine, Urine: 135 mg/dL (ref 20–320)
Microalb Creat Ratio: 1 mcg/mg creat (ref <30)
Microalb, Ur: 0.2 mg/dL

## 2018-08-24 LAB — MAGNESIUM: Magnesium: 2.2 mg/dL (ref 1.5–2.5)

## 2018-08-24 LAB — TSH: TSH: 0.81 mIU/L (ref 0.40–4.50)

## 2018-09-20 ENCOUNTER — Telehealth: Payer: Self-pay | Admitting: Physician Assistant

## 2018-09-20 MED ORDER — EZETIMIBE 10 MG PO TABS: 10.0000 mg | ORAL_TABLET | Freq: Every day | ORAL | 1 refills | Status: DC

## 2018-09-20 NOTE — Telephone Encounter (Signed)
-----   Message from Elenor Quinones, [place] sent at 09/19/2018  3:00 PM EDT ----- Regarding: OFFICE NOTE Contact: [phone] OFFICE NOTE:     Wants to change CRESTOR due to it making him feel bad. He wants to try something else. He also said his TESTOSTERONE level was LOW and you said something about him using a CREAM to put on and he wants to start on that as well.   Pharmacy: [facility] on [address]

## 2018-09-22 ENCOUNTER — Other Ambulatory Visit: Payer: Self-pay | Admitting: Internal Medicine

## 2018-09-22 DIAGNOSIS — E349 Endocrine disorder, unspecified: Secondary | ICD-10-CM

## 2018-09-22 MED ORDER — TADALAFIL 20 MG PO TABS: ORAL_TABLET | ORAL | 99 refills | Status: DC

## 2018-09-26 ENCOUNTER — Other Ambulatory Visit: Payer: Self-pay | Admitting: Internal Medicine

## 2018-09-26 DIAGNOSIS — E349 Endocrine disorder, unspecified: Secondary | ICD-10-CM

## 2018-09-26 MED ORDER — TADALAFIL 20 MG PO TABS: ORAL_TABLET | ORAL | 0 refills | Status: DC

## 2018-09-27 ENCOUNTER — Other Ambulatory Visit: Payer: Self-pay | Admitting: *Deleted

## 2018-09-27 DIAGNOSIS — E349 Endocrine disorder, unspecified: Secondary | ICD-10-CM

## 2018-09-27 MED ORDER — TADALAFIL 20 MG PO TABS: ORAL_TABLET | ORAL | 0 refills | Status: DC

## 2018-10-25 DIAGNOSIS — E349 Endocrine disorder, unspecified: Secondary | ICD-10-CM

## 2018-10-25 MED ORDER — TESTOSTERONE 20.25 MG/ACT (1.62%) TD GEL: TRANSDERMAL | 3 refills | Status: DC

## 2018-11-28 ENCOUNTER — Ambulatory Visit: Payer: Commercial Managed Care - PPO | Admitting: Physician Assistant

## 2018-12-11 NOTE — Progress Notes (Signed)
Patient ID: Eron Tricarico, male   DOB: 1970/12/10, 48 y.o.   MRN: [id number]  Assessment and Plan:   Hypertension, unspecified type -     CBC with Diff -     COMPLETE METABOLIC PANEL WITH GFR - continue medications, DASH diet, exercise and monitor at home. Call if greater than 130/80.   Atrial fibrillation with rapid ventricular response (H[facility]) Continue medications, rate controlled, follow up cardio  Mixed hyperlipidemia -     Lipid Profile check lipids- doing well on zetia no AE's, may try nexletol/nexlizet next OV to reach goal less than 100 decrease fatty foods increase activity.   Testosterone deficiency -     Testosterone, Total -     Testosterone (ANDROGEL PUMP) 20.25 MG/ACT (1.62%) GEL; 2 pumps daily on each arm (4 pumps total) daily States he is feeling better on treatment, will see if he needs 4 pumps daily  Vitamin D deficiency -     Vitamin D (25 hydroxy)    Continue diet and meds as discussed. Further disposition pending results of labs. Future Appointments  Date Time Provider [place]  08/27/2019  3:00 PM Vicie Mutters, PA-C [facility] None    HPI 48 y.o. male  presents for 3 month follow up with hypertension, hyperlipidemia, prediabetes and vitamin D.   His blood pressure has been controlled at home, today their BP is BP: 118/72.   He does workout. He denies chest pain, shortness of breath, dizziness.  He has history of Afib, CHADSVAC 1, on bASA, dad had afib as well, will discuss with cardio the options.   Left plantar arch pain x 3 weeks, did big incline, no pain in toes.   He has a history of testosterone deficiency and is on testosterone replacement. He is doing 4 pumps each arm,  He states that the testosterone helps with his energy, libido, muscle mass. Lab Results  Component Value Date   TESTOSTERONE 155 (L) 08/23/2018    He is on cholesterol medication, started zetia last visit, can not tolerate statins and denies myalgias. His cholesterol  is not at goal. The cholesterol last visit was:   Lab Results  Component Value Date   CHOL 315 (H) 08/23/2018   HDL 66 08/23/2018   LDLCALC 186 (H) 08/23/2018   TRIG 373 (H) 08/23/2018   CHOLHDL 4.8 08/23/2018    He has been working on diet and exercise for prediabetes, and denies foot ulcerations, hyperglycemia, hypoglycemia , increased appetite, nausea, paresthesia of the feet, polydipsia, polyuria, visual disturbances, vomiting and weight loss. Last A1C in the office was:  Lab Results  Component Value Date   HGBA1C 5.2 08/22/2017   Patient is on Vitamin D supplement.  Lab Results  Component Value Date   VD25OH 29 (L) 08/23/2018     Current Medications:  Current Outpatient Medications on File Prior to Visit  Medication Sig Dispense Refill  . cyclobenzaprine (FLEXERIL) 10 MG tablet Take 1 tablet (10 mg total) by mouth 3 (three) times daily as needed for muscle spasms. 30 tablet 0  . ezetimibe (ZETIA) 10 MG tablet Take 1 tablet (10 mg total) by mouth daily. 90 tablet 1  . Psyllium (METAMUCIL FIBER PO) Take 1 Dose by mouth daily.    . tadalafil (CIALIS) 20 MG tablet Take 1/2 to 1 tablet every 2 to 3 days as needed for [name] 30 tablet 0   No current facility-administered medications on file prior to visit.     Medical History:  Past Medical History:  Diagnosis Date  . Atrial fibrillation with rapid ventricular response ([place])   . GERD (gastroesophageal reflux disease)   . Hyperlipidemia   . Other testicular hypofunction   . Unspecified vitamin D deficiency    Review of Systems:  Review of Systems  Constitutional: Negative for chills, fever and malaise/fatigue.  HENT: Negative for congestion, ear discharge and sore throat.   Eyes: Negative.   Respiratory: Negative for cough, shortness of breath and wheezing.   Cardiovascular: Negative for chest pain, palpitations and leg swelling.  Gastrointestinal: Negative for abdominal pain, blood in stool, constipation, diarrhea,  heartburn and melena.  Genitourinary: Negative.   Skin: Negative.   Neurological: Negative for headaches.   Allergies Allergies  Allergen Reactions  . Pravachol [Pravastatin] Other (See Comments)    Fatigue    SURGICAL HISTORY He  has a past surgical history that includes Shoulder surgery (2011) and Colonoscopy. FAMILY HISTORY His family history includes Cancer in his mother; Cancer (age of onset: 82) in his maternal grandmother; Colon cancer (age of onset: 26) in his paternal uncle; Colon polyps in his father; Depression in his maternal grandmother; Heart disease in his paternal grandfather; Hyperlipidemia in his father; Lymphoma in his mother. SOCIAL HISTORY He  reports that he has been smoking cigars. He has never used smokeless tobacco. He reports current alcohol use of about 21.0 standard drinks of alcohol per week. He reports that he does not use drugs.   Physical Exam: BP 118/72   Pulse 64   Temp (!) 97.5 F (36.4 C)   Wt 223 lb (101.2 kg)   SpO2 96%   BMI 29.83 kg/m  Wt Readings from Last 3 Encounters:  12/12/18 223 lb (101.2 kg)  08/23/18 217 lb (98.4 kg)  08/22/17 204 lb (92.5 kg)    General Appearance: Well nourished well developed, in no apparent distress. Eyes: PERRLA, EOMs, conjunctiva no swelling or erythema ENT/Mouth: Ear canals normal without obstruction, swelling, erythma, discharge.  TMs normal bilaterally.  Oropharynx moist, clear, without exudate, or postoropharyngeal swelling. Neck: Supple, thyroid normal,no cervical adenopathy  Respiratory: Respiratory effort normal, Breath sounds clear A&P without rhonchi, wheeze, or rale.  No retractions, no accessory usage. Cardio: RRR with no MRGs. Brisk peripheral pulses without edema.  Abdomen: Soft, + BS,  Non tender, no guarding, rebound, hernias, masses. Musculoskeletal: Full ROM, 5/5 strength, Normal gait,  right medial ankle some erythema, swelling, No warmth, no distal edema.  Skin: Warm, dry without  rashes, lesions, ecchymosis.  Neuro: Awake and oriented X 3, Cranial nerves intact. Normal muscle tone, no cerebellar symptoms. Psych: Normal affect, Insight and Judgment appropriate.    Vicie Mutters, PA-C 3:59 PM [facility]

## 2018-12-12 ENCOUNTER — Other Ambulatory Visit: Payer: Self-pay

## 2018-12-12 ENCOUNTER — Ambulatory Visit: Payer: Commercial Managed Care - PPO | Admitting: Physician Assistant

## 2018-12-12 ENCOUNTER — Encounter: Payer: Self-pay | Admitting: Physician Assistant

## 2018-12-12 VITALS — BP 118/72 | HR 64 | Temp 97.5°F | Wt 223.0 lb

## 2018-12-12 DIAGNOSIS — Z79899 Other long term (current) drug therapy: Secondary | ICD-10-CM

## 2018-12-12 DIAGNOSIS — I10 Essential (primary) hypertension: Secondary | ICD-10-CM | POA: Diagnosis not present

## 2018-12-12 DIAGNOSIS — I4891 Unspecified atrial fibrillation: Secondary | ICD-10-CM | POA: Diagnosis not present

## 2018-12-12 DIAGNOSIS — E782 Mixed hyperlipidemia: Secondary | ICD-10-CM

## 2018-12-12 DIAGNOSIS — E349 Endocrine disorder, unspecified: Secondary | ICD-10-CM

## 2018-12-12 DIAGNOSIS — E559 Vitamin D deficiency, unspecified: Secondary | ICD-10-CM

## 2018-12-12 MED ORDER — TESTOSTERONE 20.25 MG/ACT (1.62%) TD GEL: TRANSDERMAL | 3 refills | Status: DC

## 2018-12-12 NOTE — Patient Instructions (Signed)
High Cholesterol  High cholesterol is a condition in which the blood has high levels of a white, waxy, fat-like substance (cholesterol). The human body needs small amounts of cholesterol. The liver makes all the cholesterol that the body needs. Extra (excess) cholesterol comes from the food that we eat. Cholesterol is carried from the liver by the blood through the blood vessels. If you have high cholesterol, deposits (plaques) may build up on the walls of your blood vessels (arteries). Plaques make the arteries narrower and stiffer. Cholesterol plaques increase your risk for heart attack and stroke. Work with your health care provider to keep your cholesterol levels in a healthy range. What increases the risk? This condition is more likely to develop in people who:  Eat foods that are high in animal fat (saturated fat) or cholesterol.  Are overweight.  Are not getting enough exercise.  Have a family history of high cholesterol. What are the signs or symptoms? There are no symptoms of this condition. How is this diagnosed? This condition may be diagnosed from the results of a blood test.  If you are older than age 20, your health care provider may check your cholesterol every 4-6 years.  You may be checked more often if you already have high cholesterol or other risk factors for heart disease. The blood test for cholesterol measures:  "Bad" cholesterol (LDL cholesterol). This is the main type of cholesterol that causes heart disease. The desired level for LDL is less than 100.  "Good" cholesterol (HDL cholesterol). This type helps to protect against heart disease by cleaning the arteries and carrying the LDL away. The desired level for HDL is 60 or higher.  Triglycerides. These are fats that the body can store or burn for energy. The desired number for triglycerides is lower than 150.  Total cholesterol. This is a measure of the total amount of cholesterol in your blood, including LDL  cholesterol, HDL cholesterol, and triglycerides. A healthy number is less than 200. How is this treated? This condition is treated with diet changes, lifestyle changes, and medicines. Diet changes  This may include eating more whole grains, fruits, vegetables, nuts, and fish.  This may also include cutting back on red meat and foods that have a lot of added sugar. Lifestyle changes  Changes may include getting at least 40 minutes of aerobic exercise 3 times a week. Aerobic exercises include walking, biking, and swimming. Aerobic exercise along with a healthy diet can help you maintain a healthy weight.  Changes may also include quitting smoking. Medicines  Medicines are usually given if diet and lifestyle changes have failed to reduce your cholesterol to healthy levels.  Your health care provider may prescribe a statin medicine. Statin medicines have been shown to reduce cholesterol, which can reduce the risk of heart disease. Follow these instructions at home: Eating and drinking If told by your health care provider:  Eat chicken (without skin), fish, veal, shellfish, ground turkey breast, and round or loin cuts of red meat.  Do not eat fried foods or fatty meats, such as hot dogs and salami.  Eat plenty of fruits, such as apples.  Eat plenty of vegetables, such as broccoli, potatoes, and carrots.  Eat beans, peas, and lentils.  Eat grains such as barley, rice, couscous, and bulgur wheat.  Eat pasta without cream sauces.  Use skim or nonfat milk, and eat low-fat or nonfat yogurt and cheeses.  Do not eat or drink whole milk, cream, ice cream, egg yolks,   or hard cheeses.  Do not eat stick margarine or tub margarines that contain trans fats (also called partially hydrogenated oils).  Do not eat saturated tropical oils, such as coconut oil and palm oil.  Do not eat cakes, cookies, crackers, or other baked goods that contain trans fats.  General instructions  Exercise as  directed by your health care provider. Increase your activity level with activities such as gardening, walking, and taking the stairs.  Take over-the-counter and prescription medicines only as told by your health care provider.  Do not use any products that contain nicotine or tobacco, such as cigarettes and e-cigarettes. If you need help quitting, ask your health care provider.  Keep all follow-up visits as told by your health care provider. This is important. Contact a health care provider if:  You are struggling to maintain a healthy diet or weight.  You need help to start on an exercise program.  You need help to stop smoking. Get help right away if:  You have chest pain.  You have trouble breathing. This information is not intended to replace advice given to you by your health care provider. Make sure you discuss any questions you have with your health care provider. Document Released: 12/21/2004 Document Revised: 12/24/2016 Document Reviewed: 06/21/2015 Elsevier Patient Education  [place].   Coronary Calcium Scan A coronary calcium scan is an imaging test used to look for deposits of calcium and other fatty materials (plaques) in the inner lining of the blood vessels of the heart (coronary arteries). These deposits of calcium and plaques can partly clog and narrow the coronary arteries without producing any symptoms or warning signs. This puts a person at risk for a heart attack. This test can detect these deposits before symptoms develop. Tell a health care provider about:  Any allergies you have.  All medicines you are taking, including vitamins, herbs, eye drops, creams, and over-the-counter medicines.  Any problems you or family members have had with anesthetic medicines.  Any blood disorders you have.  Any surgeries you have had.  Any medical conditions you have.  Whether you are pregnant or may be pregnant. What are the risks? Generally, this is a safe  procedure. However, problems may occur, including:  Harm to a pregnant woman and her unborn baby. This test involves the use of radiation. Radiation exposure can be dangerous to a pregnant woman and her unborn baby. If you are pregnant, you generally should not have this procedure done.  Slight increase in the risk of cancer. This is because of the radiation involved in the test. What happens before the procedure? No preparation is needed for this procedure. What happens during the procedure?   You will undress and remove any jewelry around your neck or chest.  You will put on a hospital gown.  Sticky electrodes will be placed on your chest. The electrodes will be connected to an electrocardiogram (ECG) machine to record a tracing of the electrical activity of your heart.  A CT scanner will take pictures of your heart. During this time, you will be asked to lie still and hold your breath for 2-3 seconds while a picture of your heart is being taken. The procedure may vary among health care providers and hospitals. What happens after the procedure?  You can get dressed.  You can return to your normal activities.  It is up to you to get the results of your test. Ask your health care provider, or the department that  is doing the test, when your results will be ready. Summary  A coronary calcium scan is an imaging test used to look for deposits of calcium and other fatty materials (plaques) in the inner lining of the blood vessels of the heart (coronary arteries).  Generally, this is a safe procedure. Tell your health care provider if you are pregnant or may be pregnant.  No preparation is needed for this procedure.  A CT scanner will take pictures of your heart.  You can return to your normal activities after the scan is done. This information is not intended to replace advice given to you by your health care provider. Make sure you discuss any questions you have with your health care  provider. Document Released: 06/19/2007 Document Revised: 12/03/2016 Document Reviewed: 11/10/2015 Elsevier Patient Education  2020 [employer].

## 2018-12-13 LAB — CBC WITH DIFFERENTIAL/PLATELET
Absolute Monocytes: 604 cells/uL (ref 200–950)
Basophils Absolute: 37 cells/uL (ref 0–200)
Basophils Relative: 0.7 %
Eosinophils Absolute: 159 cells/uL (ref 15–500)
Eosinophils Relative: 3 %
HCT: 44.8 % (ref 38.5–50.0)
Hemoglobin: 15.5 g/dL (ref 13.2–17.1)
Lymphs Abs: 2231 cells/uL (ref 850–3900)
MCH: 32.7 pg (ref 27.0–33.0)
MCHC: 34.6 g/dL (ref 32.0–36.0)
MCV: 94.5 fL (ref 80.0–100.0)
MPV: 10.3 fL (ref 7.5–12.5)
Monocytes Relative: 11.4 %
Neutro Abs: 2268 cells/uL (ref 1500–7800)
Neutrophils Relative %: 42.8 %
Platelets: 143 10*3/uL (ref 140–400)
RBC: 4.74 10*6/uL (ref 4.20–5.80)
RDW: 12.1 % (ref 11.0–15.0)
Total Lymphocyte: 42.1 %
WBC: 5.3 10*3/uL (ref 3.8–10.8)

## 2018-12-13 LAB — COMPLETE METABOLIC PANEL WITH GFR
AG Ratio: 1.9 (calc) (ref 1.0–2.5)
ALT: 27 U/L (ref 9–46)
AST: 21 U/L (ref 10–40)
Albumin: 4.4 g/dL (ref 3.6–5.1)
Alkaline phosphatase (APISO): 51 U/L (ref 36–130)
BUN: 16 mg/dL (ref 7–25)
CO2: 24 mmol/L (ref 20–32)
Calcium: 9.7 mg/dL (ref 8.6–10.3)
Chloride: 102 mmol/L (ref 98–110)
Creat: 0.8 mg/dL (ref 0.60–1.35)
GFR, Est African American: 123 mL/min/{1.73_m2} (ref >=60)
GFR, Est Non African American: 106 mL/min/{1.73_m2} (ref >=60)
Globulin: 2.3 g/dL (calc) (ref 1.9–3.7)
Glucose, Bld: 93 mg/dL (ref 65–99)
Potassium: 4 mmol/L (ref 3.5–5.3)
Sodium: 137 mmol/L (ref 135–146)
Total Bilirubin: 0.7 mg/dL (ref 0.2–1.2)
Total Protein: 6.7 g/dL (ref 6.1–8.1)

## 2018-12-13 LAB — LIPID PANEL
Cholesterol: 265 mg/dL — ABNORMAL HIGH (ref <200)
HDL: 81 mg/dL (ref >=40)
LDL Cholesterol (Calc): 145 mg/dL (calc) — ABNORMAL HIGH
Non-HDL Cholesterol (Calc): 184 mg/dL (calc) — ABNORMAL HIGH (ref <130)
Total CHOL/HDL Ratio: 3.3 (calc) (ref <5.0)
Triglycerides: 239 mg/dL — ABNORMAL HIGH (ref <150)

## 2018-12-13 LAB — TESTOSTERONE: Testosterone: 523 ng/dL (ref 250–827)

## 2018-12-13 LAB — VITAMIN D 25 HYDROXY (VIT D DEFICIENCY, FRACTURES): Vit D, 25-Hydroxy: 34 ng/mL (ref 30–100)

## 2019-02-06 ENCOUNTER — Telehealth: Payer: Self-pay

## 2019-02-06 MED ORDER — NEXLIZET 180-10 MG PO TABS: 1.0000 | ORAL_TABLET | Freq: Every day | ORAL | 1 refills | Status: DC

## 2019-02-06 NOTE — Telephone Encounter (Signed)
Prescription request for Nexlizet, 180mg . Please send to [facility] [address].

## 2019-03-27 ENCOUNTER — Ambulatory Visit: Payer: Commercial Managed Care - PPO | Admitting: Cardiovascular Disease

## 2019-03-27 ENCOUNTER — Other Ambulatory Visit: Payer: Self-pay

## 2019-03-27 ENCOUNTER — Encounter: Payer: Self-pay | Admitting: Cardiovascular Disease

## 2019-03-27 VITALS — BP 122/82 | HR 60 | Ht 72.0 in | Wt 226.8 lb

## 2019-03-27 DIAGNOSIS — I480 Paroxysmal atrial fibrillation: Secondary | ICD-10-CM | POA: Diagnosis not present

## 2019-03-27 DIAGNOSIS — E782 Mixed hyperlipidemia: Secondary | ICD-10-CM | POA: Diagnosis not present

## 2019-03-27 NOTE — Patient Instructions (Signed)
Medication Instructions:  Your physician recommends that you continue on your current medications as directed. Please refer to the Current Medication list given to you today.  *If you need a refill on your cardiac medications before your next appointment, please call your pharmacy*   Lab Work: None Ordered If you have labs (blood work) drawn today and your tests are completely normal, you will receive your results only by: Marland Kitchen MyChart Message (if you have MyChart) OR . A paper copy in the mail If you have any lab test that is abnormal or we need to change your treatment, we will call you to review the results.   Testing/Procedures: None Ordered   Follow-Up: At [facility], you and your health needs are our priority.  As part of our continuing mission to provide you with exceptional heart care, we have created designated Provider Care Teams.  These Care Teams include your primary Cardiologist (physician) and Advanced Practice Providers (APPs -  Physician Assistants and Nurse Practitioners) who all work together to provide you with the care you need, when you need it.  We recommend signing up for the patient portal called "MyChart".  Sign up information is provided on this After Visit Summary.  MyChart is used to connect with patients for Virtual Visits (Telemedicine).  Patients are able to view lab/test results, encounter notes, upcoming appointments, etc.  Non-urgent messages can be sent to your provider as well.   To learn more about what you can do with MyChart, go to [email or web address].    Your next appointment:   2 year(s)  The format for your next appointment:   In Person  Provider:   You may see Mertie Moores, MD or one of the following Advanced Practice Providers on your designated Care Team:    Richardson Dopp, PA-C  Vin [place], [place]  Daune Perch, NP    Other Instructions  [place] refers to food and lifestyle choices that are  based on the traditions of countries located on the [employer]. This way of eating has been shown to help prevent certain conditions and improve outcomes for people who have chronic diseases, like kidney disease and heart disease. What are tips for following this plan? Lifestyle  Cook and eat meals together with your family, when possible.  Drink enough fluid to keep your urine clear or pale yellow.  Be physically active every day. This includes: ? Aerobic exercise like running or swimming. ? Leisure activities like gardening, walking, or housework.  Get 7-8 hours of sleep each night.  If recommended by your health care provider, drink red wine in moderation. This means 1 glass a day for nonpregnant women and 2 glasses a day for men. A glass of wine equals 5 oz (150 mL). Reading food labels   Check the serving size of packaged foods. For foods such as rice and pasta, the serving size refers to the amount of cooked product, not dry.  Check the total fat in packaged foods. Avoid foods that have saturated fat or trans fats.  Check the ingredients list for added sugars, such as corn syrup. Shopping  At the grocery store, buy most of your food from the areas near the walls of the store. This includes: ? Fresh fruits and vegetables (produce). ? Grains, beans, nuts, and seeds. Some of these may be available in unpackaged forms or large amounts (in bulk). ? Fresh seafood. ? Poultry and eggs. ? Low-fat dairy products.  Buy whole ingredients instead  of prepackaged foods.  Buy fresh fruits and vegetables in-season from local farmers markets.  Buy frozen fruits and vegetables in resealable bags.  If you do not have access to quality fresh seafood, buy precooked frozen shrimp or canned fish, such as tuna, salmon, or sardines.  Buy small amounts of raw or cooked vegetables, salads, or olives from the deli or salad bar at your store.  Stock your pantry so you always have certain  foods on hand, such as olive oil, canned tuna, canned tomatoes, rice, pasta, and beans. Cooking  Cook foods with extra-virgin olive oil instead of using butter or other vegetable oils.  Have meat as a side dish, and have vegetables or grains as your main dish. This means having meat in small portions or adding small amounts of meat to foods like pasta or stew.  Use beans or vegetables instead of meat in common dishes like chili or lasagna.  Experiment with different cooking methods. Try roasting or broiling vegetables instead of steaming or sauteing them.  Add frozen vegetables to soups, stews, pasta, or rice.  Add nuts or seeds for added healthy fat at each meal. You can add these to yogurt, salads, or vegetable dishes.  Marinate fish or vegetables using olive oil, lemon juice, garlic, and fresh herbs. Meal planning   Plan to eat 1 vegetarian meal one day each week. Try to work up to 2 vegetarian meals, if possible.  Eat seafood 2 or more times a week.  Have healthy snacks readily available, such as: ? Vegetable sticks with hummus. ? [place] yogurt. ? Fruit and nut trail mix.  Eat balanced meals throughout the week. This includes: ? Fruit: 2-3 servings a day ? Vegetables: 4-5 servings a day ? Low-fat dairy: 2 servings a day ? Fish, poultry, or lean meat: 1 serving a day ? Beans and legumes: 2 or more servings a week ? Nuts and seeds: 1-2 servings a day ? Whole grains: 6-8 servings a day ? Extra-virgin olive oil: 3-4 servings a day  Limit red meat and sweets to only a few servings a month What are my food choices?  Mediterranean diet ? Recommended  Grains: Whole-grain pasta. Brown rice. Bulgar wheat. Polenta. Couscous. Whole-wheat bread. [place].  Vegetables: Artichokes. Beets. Broccoli. Cabbage. Carrots. Eggplant. Green beans. Chard. Kale. Spinach. Onions. Leeks. Peas. Squash. Tomatoes. Peppers. Radishes.  Fruits: Apples. Apricots. Avocado. Berries. Bananas.  [place]. Dates. Figs. Grapes. Lemons. Melon. Oranges. Peaches. Plums. Pomegranate.  Meats and other protein foods: Beans. Almonds. Sunflower seeds. Pine nuts. Peanuts. [place]. Salmon. Scallops. Shrimp. [place]tha. Tilapia. Clams. Oysters. Eggs.  Dairy: Low-fat milk. Cheese. Greek yogurt.  Beverages: Water. Red wine. Herbal tea.  Fats and oils: Extra virgin olive oil. Avocado oil. Grape seed oil.  Sweets and desserts: [place] yogurt with honey. Baked apples. Poached pears. Trail mix.  Seasoning and other foods: Basil. Cilantro. Coriander. Cumin. Mint. Parsley. Sage. Rosemary. Tarragon. Garlic. Oregano. Thyme. Pepper. Balsalmic vinegar. Tahini. Hummus. Tomato sauce. Olives. Mushrooms. ? Limit these  Grains: Prepackaged pasta or rice dishes. Prepackaged cereal with added sugar.  Vegetables: Deep fried potatoes (french fries).  Fruits: Fruit canned in syrup.  Meats and other protein foods: Beef. Pork. Lamb. Poultry with skin. Hot dogs. [place] Salines.  Dairy: Ice cream. Sour cream. Whole milk.  Beverages: Juice. Sugar-sweetened soft drinks. Beer. Liquor and spirits.  Fats and oils: Butter. Canola oil. Vegetable oil. Beef fat (tallow). Lard.  Sweets and desserts: Cookies. Cakes. Pies. Candy.  Seasoning and other foods: Mayonnaise.  Premade sauces and marinades. The items listed may not be a complete list. Talk with your dietitian about what dietary choices are right for you. Summary  The Mediterranean diet includes both food and lifestyle choices.  Eat a variety of fresh fruits and vegetables, beans, nuts, seeds, and whole grains.  Limit the amount of red meat and sweets that you eat.  Talk with your health care provider about whether it is safe for you to drink red wine in moderation. This means 1 glass a day for nonpregnant women and 2 glasses a day for men. A glass of wine equals 5 oz (150 mL). This information is not intended to replace advice given to you by your health care provider. Make sure  you discuss any questions you have with your health care provider. Document Revised: 08/21/2015 Document Reviewed: 08/14/2015 Elsevier Patient Education  [place].

## 2019-03-27 NOTE — Progress Notes (Signed)
Michael Kim Date of Birth  [birth year]       [facility]    [employer] [address], [address], [place], [place]  [address]   [place], [place]  [address] [place]   Fax  [phone]     Fax [phone]  Problem List: 1. Atrial fibrillation 2. Possible obstructive sleep apnea 3.  History of Present Illness:  Michael Kim is a 49 yo with hx of PAF.  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  I met him at the hospital with rapid AF.  He converted spontaneously.  Has not had any significant arrhythmias.     Aug. 23, 2018:  Michael Kim is seen back today after a several year absence. He was seen in the emergency room with rapid atrial fibrillation. He was started on Eliquis and Cardizem.   He converted to NSR .  He stopped the Eliquis and Diltiazem . Feels great.  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  Quit drinking caffiene.  Snores,  Does not know if he has apneic episodes.   March 27, 2019:  Michael Kim is seen for follow up of his PAF .  Now has been diagnosed with OSA. - had 35 apneic episodes / hour at night  Uses CPAP most of the time .     Occasional episodes of tachycardia.   Not irregular.   Takes propranolol with resolution in several minutes.    Has gained some weight .  Smokes cigars occasionally . Has quit caffiene.   Needs to work on weight loss.   Still eats lots of carbs.     Current Outpatient Medications on File Prior to Visit  Medication Sig Dispense Refill  . Bempedoic Acid-Ezetimibe (NEXLIZET) 180-10 MG TABS Take 1 tablet by mouth daily. 90 tablet 1  . cyclobenzaprine (FLEXERIL) 10 MG tablet Take 1 tablet (10 mg total) by mouth 3 (three) times daily as needed for muscle spasms. 30 tablet 0  . Psyllium (METAMUCIL FIBER PO) Take 1 Dose by mouth daily.    . tadalafil (CIALIS) 20 MG tablet Take 1/2 to 1 tablet every 2 to 3 days as needed for [name] 30 tablet 0  . Testosterone (ANDROGEL PUMP) 20.25 MG/ACT (1.62%) GEL 2 pumps  daily on each arm (4 pumps total) daily 450 g 3   No current facility-administered medications on file prior to visit.    Allergies  Allergen Reactions  . Pravachol [Pravastatin] Other (See Comments)    Fatigue    Past Medical History:  Diagnosis Date  . Atrial fibrillation with rapid ventricular response ([place])   . GERD (gastroesophageal reflux disease)   . Hyperlipidemia   . Other testicular hypofunction   . Unspecified vitamin D deficiency     Past Surgical History:  Procedure Laterality Date  . COLONOSCOPY    . SHOULDER SURGERY  2011    Social History   Tobacco Use  Smoking Status Current Some Day Smoker  . Types: Cigars  . Last attempt to quit: 07/29/2006  . Years since quitting: 12.6  Smokeless Tobacco Never Used    Social History   Substance and Sexual Activity  Alcohol Use Yes  . Alcohol/week: 21.0 standard drinks  . Types: 21 Cans of beer per week   Comment: 3-4 BEERS PER DAY PER PT    Family History  Problem Relation Age of Onset  . Colon polyps Father   . Hyperlipidemia Father   .  Lymphoma Mother   . Cancer Mother        lymphoma  . Colon cancer Paternal Uncle 43  . Depression Maternal Grandmother   . Cancer Maternal Grandmother 7       fatal colon  . Heart disease Paternal Grandfather   . Stomach cancer Neg Hx     Reviw of Systems:  Reviewed in the HPI.  All other systems are negative.  Physical Exam: Blood pressure 122/82, pulse 60, height 6' (1.829 m), weight 226 lb 12 oz (102.9 kg), SpO2 95 %.  GEN:  Well nourished, well developed in no acute distress HEENT: Normal NECK: No JVD; No carotid bruits LYMPHATICS: No lymphadenopathy CARDIAC: RRR , no murmurs, rubs, gallops RESPIRATORY:  Clear to auscultation without rales, wheezing or rhonchi  ABDOMEN: Soft, non-tender, non-distended MUSCULOSKELETAL:  No edema; No deformity  SKIN: Warm and dry NEUROLOGIC:  Alert and oriented x 3  ECG    March 27, 2019:    NSR at 61.      Assessment / Plan:  1. Paroxysmal atrial fibrillation:   Maintaining sinus rhythm.  We will continue current medications.  He has Inderal to take on an as-needed basis. I will see him again in 2 years for follow-up office visit. His CHADS2VASC is 0

## 2019-05-15 ENCOUNTER — Ambulatory Visit: Payer: Commercial Managed Care - PPO | Admitting: Physician Assistant

## 2019-05-17 ENCOUNTER — Encounter: Payer: Self-pay | Admitting: Physician Assistant

## 2019-05-17 ENCOUNTER — Other Ambulatory Visit: Payer: Self-pay

## 2019-05-17 ENCOUNTER — Ambulatory Visit: Payer: Commercial Managed Care - PPO | Admitting: Physician Assistant

## 2019-05-17 VITALS — BP 122/80 | HR 76 | Temp 97.5°F | Wt 214.8 lb

## 2019-05-17 DIAGNOSIS — Z79899 Other long term (current) drug therapy: Secondary | ICD-10-CM

## 2019-05-17 DIAGNOSIS — E782 Mixed hyperlipidemia: Secondary | ICD-10-CM | POA: Diagnosis not present

## 2019-05-17 DIAGNOSIS — I4891 Unspecified atrial fibrillation: Secondary | ICD-10-CM

## 2019-05-17 DIAGNOSIS — I10 Essential (primary) hypertension: Secondary | ICD-10-CM | POA: Diagnosis not present

## 2019-05-17 DIAGNOSIS — E559 Vitamin D deficiency, unspecified: Secondary | ICD-10-CM

## 2019-05-17 DIAGNOSIS — E349 Endocrine disorder, unspecified: Secondary | ICD-10-CM

## 2019-05-17 NOTE — Patient Instructions (Addendum)
Plan your night time snack, drink hot tea or sparkling water.   Suggest stopping alcohol for a while to see if this helps  Can stop the testosterone to see if it does not help   Google mindful eating and here are some tips and tricks below.   Also if you are going to have a night time, after dinner snack.  Have it planned out. Know what you are going to eat.   If you even find yourself looking in the pantry or the fridge, just looking- you are looking for comfort- drink some water, go for a walk, call a friend, try some fruit.   Rate your hunger before you eat on a scale of 1-10, try to eat closer to a 6 or higher. And if you are at below that, why are you eating? Slow down and listen to your body.

## 2019-05-17 NOTE — Progress Notes (Signed)
Patient ID: Michael Kim, male   DOB: [birth year], 49 y.o.   MRN: [id number]  Assessment and Plan:   Hypertension, unspecified type -     CBC with Diff -     COMPLETE METABOLIC PANEL WITH GFR - continue medications, DASH diet, exercise and monitor at home. Call if greater than 130/80.   Atrial fibrillation with rapid ventricular response (HCC) Continue medications, rate controlled, follow up cardio  Mixed hyperlipidemia -     Lipid Profile check lipids- doing well on zetia no AE's, may try nexletol/nexlizet next OV to reach goal less than 100 decrease fatty foods increase activity.   Testosterone deficiency -     Testosterone, Total -   Check estrogen with decreased libido - states he feels no difference, may get him to stop and recheck symptoms Continue weight loss Avoid ETOH  Vitamin D deficiency -     Vitamin D (25 hydroxy)    Continue diet and meds as discussed. Further disposition pending results of labs. Future Appointments  Date Time Provider [place]  09/05/2019  3:00 PM Vicie Mutters, PA-C [facility] None    HPI 49 y.o. male  presents for 3 month follow up with hypertension, hyperlipidemia, prediabetes and vitamin D.   His blood pressure has been controlled at home, today their BP is BP: 122/80.   He does workout. He denies chest pain, shortness of breath, dizziness.  He has history of Afib, CHADSVAC 1, on bASA, dad had afib as well, will discuss with cardio the options.  BMI is Body mass index is 29.13 kg/m., he is working on diet and exercise. He has cut back on portions, he is eating  Better foods. Will still eat after 7 pm.  Wt Readings from Last 3 Encounters:  05/17/19 214 lb 12.8 oz (97.4 kg)  03/27/19 226 lb 12 oz (102.9 kg)  12/12/18 223 lb (101.2 kg)   He has a history of testosterone deficiency and is on testosterone replacement. He is doing 4 pumps each arm,  He states he does not know if it helps with energy, may stop it.  Lab Results   Component Value Date   TESTOSTERONE 523 12/12/2018    He is on cholesterol medication, started on nexlizet last visit, can not tolerate statins and denies myalgias. His cholesterol is not at goal. The cholesterol last visit was:   Lab Results  Component Value Date   CHOL 265 (H) 12/12/2018   HDL 81 12/12/2018   LDLCALC 145 (H) 12/12/2018   TRIG 239 (H) 12/12/2018   CHOLHDL 3.3 12/12/2018    He has been working on diet and exercise for prediabetes, and denies foot ulcerations, hyperglycemia, hypoglycemia , increased appetite, nausea, paresthesia of the feet, polydipsia, polyuria, visual disturbances, vomiting and weight loss. Last A1C in the office was:  Lab Results  Component Value Date   HGBA1C 5.2 08/22/2017   Patient is on Vitamin D supplement.  Lab Results  Component Value Date   VD25OH 34 12/12/2018     Current Medications:  Current Outpatient Medications on File Prior to Visit  Medication Sig Dispense Refill  . Bempedoic Acid-Ezetimibe (NEXLIZET) 180-10 MG TABS Take 1 tablet by mouth daily. 90 tablet 1  . cyclobenzaprine (FLEXERIL) 10 MG tablet Take 1 tablet (10 mg total) by mouth 3 (three) times daily as needed for muscle spasms. 30 tablet 0  . Psyllium (METAMUCIL FIBER PO) Take 1 Dose by mouth daily.    . tadalafil (CIALIS) 20 MG tablet Take  1/2 to 1 tablet every 2 to 3 days as needed for [name] 30 tablet 0  . Testosterone (ANDROGEL PUMP) 20.25 MG/ACT (1.62%) GEL 2 pumps daily on each arm (4 pumps total) daily 450 g 3   No current facility-administered medications on file prior to visit.    Medical History:  Past Medical History:  Diagnosis Date  . Atrial fibrillation with rapid ventricular response ([place])   . GERD (gastroesophageal reflux disease)   . Hyperlipidemia   . Other testicular hypofunction   . Unspecified vitamin D deficiency    Review of Systems:  Review of Systems  Constitutional: Negative for chills, fever and malaise/fatigue.  HENT: Negative for  congestion, ear discharge and sore throat.   Eyes: Negative.   Respiratory: Negative for cough, shortness of breath and wheezing.   Cardiovascular: Negative for chest pain, palpitations and leg swelling.  Gastrointestinal: Negative for abdominal pain, blood in stool, constipation, diarrhea, heartburn and melena.  Genitourinary: Negative.   Skin: Negative.   Neurological: Negative for headaches.   Allergies Allergies  Allergen Reactions  . Pravachol [Pravastatin] Other (See Comments)    Fatigue    SURGICAL HISTORY He  has a past surgical history that includes Shoulder surgery (2011) and Colonoscopy. FAMILY HISTORY His family history includes Cancer in his mother; Cancer (age of onset: 63) in his maternal grandmother; Colon cancer (age of onset: 52) in his paternal uncle; Colon polyps in his father; Depression in his maternal grandmother; Heart disease in his paternal grandfather; Hyperlipidemia in his father; Lymphoma in his mother. SOCIAL HISTORY He  reports that he has been smoking cigars. He has never used smokeless tobacco. He reports current alcohol use of about 21.0 standard drinks of alcohol per week. He reports that he does not use drugs.   Physical Exam: BP 122/80   Pulse 76   Temp (!) 97.5 F (36.4 C)   Wt 214 lb 12.8 oz (97.4 kg)   SpO2 97%   BMI 29.13 kg/m  Wt Readings from Last 3 Encounters:  05/17/19 214 lb 12.8 oz (97.4 kg)  03/27/19 226 lb 12 oz (102.9 kg)  12/12/18 223 lb (101.2 kg)    General Appearance: Well nourished well developed, in no apparent distress. Eyes: PERRLA, EOMs, conjunctiva no swelling or erythema ENT/Mouth: Ear canals normal without obstruction, swelling, erythma, discharge.  TMs normal bilaterally.  Oropharynx moist, clear, without exudate, or postoropharyngeal swelling. Neck: Supple, thyroid normal,no cervical adenopathy  Respiratory: Respiratory effort normal, Breath sounds clear A&P without rhonchi, wheeze, or rale.  No retractions, no  accessory usage. Cardio: RRR with no MRGs. Brisk peripheral pulses without edema.  Abdomen: Soft, + BS,  Non tender, no guarding, rebound, hernias, masses. Musculoskeletal: Full ROM, 5/5 strength, Normal gait,  right medial ankle some erythema, swelling, No warmth, no distal edema.  Skin: Warm, dry without rashes, lesions, ecchymosis.  Neuro: Awake and oriented X 3, Cranial nerves intact. Normal muscle tone, no cerebellar symptoms. Psych: Normal affect, Insight and Judgment appropriate.    Vicie Mutters, PA-C 3:56 PM [facility]

## 2019-05-18 LAB — LIPID PANEL
Cholesterol: 251 mg/dL — ABNORMAL HIGH (ref <200)
HDL: 87 mg/dL (ref >=40)
LDL Cholesterol (Calc): 132 mg/dL (calc) — ABNORMAL HIGH
Non-HDL Cholesterol (Calc): 164 mg/dL (calc) — ABNORMAL HIGH (ref <130)
Total CHOL/HDL Ratio: 2.9 (calc) (ref <5.0)
Triglycerides: 182 mg/dL — ABNORMAL HIGH (ref <150)

## 2019-05-18 LAB — CBC WITH DIFFERENTIAL/PLATELET
Absolute Monocytes: 586 cells/uL (ref 200–950)
Basophils Absolute: 41 cells/uL (ref 0–200)
Basophils Relative: 0.7 %
Eosinophils Absolute: 157 cells/uL (ref 15–500)
Eosinophils Relative: 2.7 %
HCT: 44.3 % (ref 38.5–50.0)
Hemoglobin: 15.2 g/dL (ref 13.2–17.1)
Lymphs Abs: 2036 cells/uL (ref 850–3900)
MCH: 32.7 pg (ref 27.0–33.0)
MCHC: 34.3 g/dL (ref 32.0–36.0)
MCV: 95.3 fL (ref 80.0–100.0)
MPV: 10.9 fL (ref 7.5–12.5)
Monocytes Relative: 10.1 %
Neutro Abs: 2981 cells/uL (ref 1500–7800)
Neutrophils Relative %: 51.4 %
Platelets: 164 10*3/uL (ref 140–400)
RBC: 4.65 10*6/uL (ref 4.20–5.80)
RDW: 12.1 % (ref 11.0–15.0)
Total Lymphocyte: 35.1 %
WBC: 5.8 10*3/uL (ref 3.8–10.8)

## 2019-05-18 LAB — COMPLETE METABOLIC PANEL WITH GFR
AG Ratio: 2.2 (calc) (ref 1.0–2.5)
ALT: 22 U/L (ref 9–46)
AST: 25 U/L (ref 10–40)
Albumin: 5 g/dL (ref 3.6–5.1)
Alkaline phosphatase (APISO): 48 U/L (ref 36–130)
BUN: 19 mg/dL (ref 7–25)
CO2: 26 mmol/L (ref 20–32)
Calcium: 9.8 mg/dL (ref 8.6–10.3)
Chloride: 100 mmol/L (ref 98–110)
Creat: 0.72 mg/dL (ref 0.60–1.35)
GFR, Est African American: 128 mL/min/{1.73_m2} (ref >=60)
GFR, Est Non African American: 110 mL/min/{1.73_m2} (ref >=60)
Globulin: 2.3 g/dL (calc) (ref 1.9–3.7)
Glucose, Bld: 94 mg/dL (ref 65–99)
Potassium: 3.8 mmol/L (ref 3.5–5.3)
Sodium: 135 mmol/L (ref 135–146)
Total Bilirubin: 0.7 mg/dL (ref 0.2–1.2)
Total Protein: 7.3 g/dL (ref 6.1–8.1)

## 2019-05-18 LAB — TSH: TSH: 2.04 mIU/L (ref 0.40–4.50)

## 2019-05-18 LAB — TESTOSTERONE: Testosterone: 482 ng/dL (ref 250–827)

## 2019-05-18 LAB — ESTRADIOL: Estradiol: 32 pg/mL (ref <=39)

## 2019-06-08 ENCOUNTER — Telehealth: Payer: Self-pay | Admitting: *Deleted

## 2019-06-08 NOTE — Telephone Encounter (Signed)
Patient would like go up to 8 cm H20 to get more air. Please write pressure change for [facility] choice home.

## 2019-06-12 NOTE — Telephone Encounter (Addendum)
Virtual appointment made for 06/22/19 8 am. Pt is aware and agreeable to treatment.

## 2019-06-12 NOTE — Telephone Encounter (Signed)
RE: PRESSURE CHANGE Turner, Eber Hong, MD  Freada Bergeron, CMA  Patient needs an appt with me as I have never seen him with an OV

## 2019-06-22 ENCOUNTER — Encounter: Payer: Self-pay | Admitting: Cardiology

## 2019-06-22 ENCOUNTER — Telehealth (INDEPENDENT_AMBULATORY_CARE_PROVIDER_SITE_OTHER): Payer: Commercial Managed Care - PPO | Admitting: Cardiology

## 2019-06-22 VITALS — BP 120/80 | HR 63 | Ht 72.0 in | Wt 204.0 lb

## 2019-06-22 DIAGNOSIS — E669 Obesity, unspecified: Secondary | ICD-10-CM

## 2019-06-22 DIAGNOSIS — G4733 Obstructive sleep apnea (adult) (pediatric): Secondary | ICD-10-CM | POA: Diagnosis not present

## 2019-06-22 NOTE — Progress Notes (Addendum)
Virtual Visit via Telephone Note   This visit type was conducted due to national recommendations for restrictions regarding the COVID-19 Pandemic (e.g. social distancing) in an effort to limit this patient's exposure and mitigate transmission in our community.  Due to his co-morbid illnesses, this patient is at least at moderate risk for complications without adequate follow up.  This format is felt to be most appropriate for this patient at this time.  All issues noted in this document were discussed and addressed.  A limited physical exam was performed with this format.  Please refer to the patient's chart for his consent to telehealth for [facility].  Evaluation Performed:  Follow-up visit  This visit type was conducted due to national recommendations for restrictions regarding the COVID-19 Pandemic (e.g. social distancing).  This format is felt to be most appropriate for this patient at this time.  All issues noted in this document were discussed and addressed.  No physical exam was performed (except for noted visual exam findings with Video Visits).  Please refer to the patient's chart (MyChart message for video visits and phone note for telephone visits) for the patient's consent to telehealth for [facility].  Date:  06/22/2019   ID:  Michael Kim, DOB [birth year], MRN [id number]  Patient Location:  Home  Provider location:   [place]  PCP:  Unk Pinto, MD  Cardiologist:  Mertie Moores, MD Electrophysiologist:  None   Chief Complaint:  OSA  History of Present Illness:    Michael Kim is a 49 y.o. male who presents via audio/video conferencing for a telehealth visit today.    This is a 49yo male with a hx of PAF who was referred by Dr. Acie Fredrickson for evaluation of OSA.   He was dx with severe OSA in 2018 with an AHI of 34/hr and was started on CPAP but never followed up.  He is now here to establish sleep care.  He is doing well with his CPAP device and thinks that he has  gotten used to it.  He tolerates the mask and feels the pressure is adequate.  Since going on CPAP he feels rested in the am and has no significant daytime sleepiness.  He denies any significant mouth or nasal dryness or nasal congestion.  He does not think that he snores.  He says that he feels so much better when he uses his device but is not very compliant in using it.  He says that he will lay down to go to sleep and watches some TV at first and then falls asleep and wakes up in the am and has not used it. He realized that he needs to wear it and says that he needs to just make the decision to use it.  He says that he snores very badly and wakes up gasping for breath when he does not use it.    Prior CV studies:   The following studies were reviewed today:  Sleep study and PAP compliance download  Past Medical History:  Diagnosis Date  . Atrial fibrillation with rapid ventricular response ([place])   . GERD (gastroesophageal reflux disease)   . Hyperlipidemia   . Other testicular hypofunction   . Unspecified vitamin D deficiency    Past Surgical History:  Procedure Laterality Date  . COLONOSCOPY    . SHOULDER SURGERY  2011     Current Meds  Medication Sig  . Bempedoic Acid-Ezetimibe (NEXLIZET) 180-10 MG TABS Take 1 tablet by mouth daily.  Marland Kitchen  cholecalciferol (VITAMIN D3) 25 MCG (1000 UNIT) tablet Take 1,000 Units by mouth daily.  . cyclobenzaprine (FLEXERIL) 10 MG tablet Take 1 tablet (10 mg total) by mouth 3 (three) times daily as needed for muscle spasms.  . Multiple Vitamin (MULTIVITAMIN ADULT PO) Take by mouth.  . Psyllium (METAMUCIL FIBER PO) Take 1 Dose by mouth daily.  . tadalafil (CIALIS) 20 MG tablet Take 1/2 to 1 tablet every 2 to 3 days as needed for [name]     Allergies:   Pravachol [pravastatin]   Social History   Tobacco Use  . Smoking status: Current Some Day Smoker    Types: Cigars    Last attempt to quit: 07/29/2006    Years since quitting: 12.9  . Smokeless  tobacco: Never Used  Vaping Use  . Vaping Use: Never used  Substance Use Topics  . Alcohol use: Yes    Alcohol/week: 21.0 standard drinks    Types: 21 Cans of beer per week    Comment: 3-4 BEERS PER DAY PER PT  . Drug use: No     Family Hx: The patient's family history includes Cancer in his mother; Cancer (age of onset: 63) in his maternal grandmother; Colon cancer (age of onset: 75) in his paternal uncle; Colon polyps in his father; Depression in his maternal grandmother; Heart disease in his paternal grandfather; Hyperlipidemia in his father; Lymphoma in his mother. There is no history of Stomach cancer.  ROS:   Please see the history of present illness.     All other systems reviewed and are negative.   Labs/Other Tests and Data Reviewed:    Recent Labs: 08/23/2018: Magnesium 2.2 05/17/2019: ALT 22; BUN 19; Creat 0.72; Hemoglobin 15.2; Platelets 164; Potassium 3.8; Sodium 135; TSH 2.04   Recent Lipid Panel Lab Results  Component Value Date/Time   CHOL 251 (H) 05/17/2019 04:25 PM   TRIG 182 (H) 05/17/2019 04:25 PM   HDL 87 05/17/2019 04:25 PM   CHOLHDL 2.9 05/17/2019 04:25 PM   LDLCALC 132 (H) 05/17/2019 04:25 PM    Wt Readings from Last 3 Encounters:  06/22/19 204 lb (92.5 kg)  05/17/19 214 lb 12.8 oz (97.4 kg)  03/27/19 226 lb 12 oz (102.9 kg)     Objective:    Vital Signs:  BP 120/80   Pulse 63   Ht 6' (1.829 m)   Wt 204 lb (92.5 kg)   BMI 27.67 kg/m     ASSESSMENT & PLAN:    1.  OSA -  The patient is tolerating PAP therapy well without any problems. The PAP download was reviewed today and showed an AHI of 1.9/hr on 7 cm H2O with 17% compliance in using more than 4 hours nightly.  The patient has been using and benefiting from PAP use and will continue to benefit from therapy.  -I have encouraged him to be more compliant with his device which he knows he needs to do   Time:   Today, I have spent 20 minutes on telemedicine discussing medical problems  including OSA and Obeisty and reviewing patient's chart including sleep study and PAP compliance download.  Medication Adjustments/Labs and Tests Ordered: Current medicines are reviewed at length with the patient today.  Concerns regarding medicines are outlined above.  Tests Ordered: No orders of the defined types were placed in this encounter.  Medication Changes: No orders of the defined types were placed in this encounter.   Disposition:  Follow up in 1 year(s)  Signed, Jaiyanna Safran  Radford Pax, MD  06/22/2019 8:12 AM    [place]

## 2019-06-22 NOTE — Patient Instructions (Signed)

## 2019-07-25 DIAGNOSIS — Z3009 Encounter for other general counseling and advice on contraception: Secondary | ICD-10-CM

## 2019-08-01 NOTE — Telephone Encounter (Signed)
In review w/ [facility]

## 2019-08-27 ENCOUNTER — Encounter: Payer: Commercial Managed Care - PPO | Admitting: Physician Assistant

## 2019-09-05 ENCOUNTER — Encounter: Payer: Commercial Managed Care - PPO | Admitting: Physician Assistant

## 2019-10-24 ENCOUNTER — Encounter: Payer: Self-pay | Admitting: Adult Health Nurse Practitioner

## 2019-10-24 ENCOUNTER — Ambulatory Visit (INDEPENDENT_AMBULATORY_CARE_PROVIDER_SITE_OTHER): Payer: Commercial Managed Care - PPO | Admitting: Adult Health Nurse Practitioner

## 2019-10-24 ENCOUNTER — Other Ambulatory Visit: Payer: Self-pay

## 2019-10-24 VITALS — BP 116/80 | HR 62 | Temp 97.6°F | Ht 72.0 in | Wt 221.0 lb

## 2019-10-24 DIAGNOSIS — Z9989 Dependence on other enabling machines and devices: Secondary | ICD-10-CM

## 2019-10-24 DIAGNOSIS — Z136 Encounter for screening for cardiovascular disorders: Secondary | ICD-10-CM | POA: Diagnosis not present

## 2019-10-24 DIAGNOSIS — Z13220 Encounter for screening for lipoid disorders: Secondary | ICD-10-CM

## 2019-10-24 DIAGNOSIS — Z131 Encounter for screening for diabetes mellitus: Secondary | ICD-10-CM

## 2019-10-24 DIAGNOSIS — Z0000 Encounter for general adult medical examination without abnormal findings: Secondary | ICD-10-CM

## 2019-10-24 DIAGNOSIS — G4733 Obstructive sleep apnea (adult) (pediatric): Secondary | ICD-10-CM

## 2019-10-24 DIAGNOSIS — I10 Essential (primary) hypertension: Secondary | ICD-10-CM

## 2019-10-24 DIAGNOSIS — E782 Mixed hyperlipidemia: Secondary | ICD-10-CM

## 2019-10-24 DIAGNOSIS — Z6832 Body mass index (BMI) 32.0-32.9, adult: Secondary | ICD-10-CM

## 2019-10-24 DIAGNOSIS — Z79899 Other long term (current) drug therapy: Secondary | ICD-10-CM

## 2019-10-24 DIAGNOSIS — Z1389 Encounter for screening for other disorder: Secondary | ICD-10-CM

## 2019-10-24 DIAGNOSIS — E349 Endocrine disorder, unspecified: Secondary | ICD-10-CM

## 2019-10-24 DIAGNOSIS — Z1329 Encounter for screening for other suspected endocrine disorder: Secondary | ICD-10-CM

## 2019-10-24 DIAGNOSIS — Z800 Family history of malignant neoplasm of digestive organs: Secondary | ICD-10-CM

## 2019-10-24 DIAGNOSIS — Z2821 Immunization not carried out because of patient refusal: Secondary | ICD-10-CM

## 2019-10-24 DIAGNOSIS — Z0001 Encounter for general adult medical examination with abnormal findings: Secondary | ICD-10-CM

## 2019-10-24 DIAGNOSIS — I4891 Unspecified atrial fibrillation: Secondary | ICD-10-CM

## 2019-10-24 DIAGNOSIS — Z6829 Body mass index (BMI) 29.0-29.9, adult: Secondary | ICD-10-CM

## 2019-10-24 DIAGNOSIS — E559 Vitamin D deficiency, unspecified: Secondary | ICD-10-CM

## 2019-10-24 MED ORDER — TESTOSTERONE 20.25 MG/ACT (1.62%) TD GEL: TRANSDERMAL | 3 refills | Status: DC

## 2019-10-24 MED ORDER — TADALAFIL 20 MG PO TABS: ORAL_TABLET | ORAL | 0 refills | Status: AC

## 2019-10-24 NOTE — Progress Notes (Signed)
COMPLETE PHYSICAL   Assessment and Plan:   Michael Kim was seen today for annual exam.  Diagnoses and all orders for this visit:  Encounter for general adult medical examination with abnormal findings Yearly -     CBC with Differential/Platelet -     COMPLETE METABOLIC PANEL WITH GFR  Hypertension, unspecified type Controlled today Monitor blood pressure at home; call if consistently over 130/80 Continue DASH diet.   Reminder to go to the ER if any CP, SOB, nausea, dizziness, severe HA, changes vision/speech, left arm numbness and tingling and jaw pain. -     Magnesium  Mixed hyperlipidemia Continue medications: Nexlizet 180-10mg  Discussed dietary and exercise modifications Low fat diet -     Lipid panel  Vitamin D deficiency Continue supplementation to maintain goal of 70-100 Taking Vitamin D 1,000 IU daily -     VITAMIN D 25 Hydroxy (Vit-D Deficiency, Fractures)  Atrial fibrillation with rapid ventricular response (HCC) Continue CPAP Avoid ETHO, caffeine Contiue follow u with cardiology -     EKG 12-Lead  OSA on CPAP Continue CPAP/BiPAP, using nightly for at least 8 hours  Helping with daytime fatigue Weight loss still advised Discussed mask & tubing hygeine  Testosterone deficiency -     Testosterone (ANDROGEL PUMP) 20.25 MG/ACT (1.62%) GEL; 2 pumps daily on each arm (4 pumps total) daily -     tadalafil (CIALIS) 20 MG tablet; Take 1/2 to 1 tablet every 2 to 3 days as needed for Michael Kim  Family history of colon cancer UTD next 2023  Medication management Continued  BMI 29.0-29.9,adult Discussed dietary and exercise modifications  Screening cholesterol level -     Lipid panel  Screening for thyroid disorder -     TSH  Screening for diabetes mellitus -     Hemoglobin A1c  Screening for blood or protein in urine -     Urinalysis w microscopic + reflex cultur -     Microalbumin / creatinine urine ratio -     REFLEXIVE URINE CULTURE  Influenza vaccination  declined Discussed with patient   Further disposition pending results if labs check today. Discussed med's effects and SE's.   Over 30 minutes of face to face interview, exam, counseling, chart review, and critical decision making was performed.    Future Appointments  Date Time Provider [place]  04/23/2020  3:30 PM Garnet Sierras, NP [facility] None  10/23/2020  3:00 PM Jaret Coppedge, Danton Sewer, NP [facility] None    HPI 49 Michael.Kim. male  presents for complete physical and follow up on HTN, HLD,  Prediabetes, OSA on CPAP, testosterone deficency and vitamin D.  He continues to teach golf.   His blood pressure has been controlled at home, today their BP is BP: 116/80.   He does workout. He denies chest pain, shortness of breath, dizziness. He has history of Afib, folllows cardio CHADSVAC 1, on bASA. He is on CPAP,  helps with his sleep and energy.    He is on cholesterol medication, nexlizet and denies myalgias. His cholesterol is not at goal. The cholesterol last visit was:   Lab Results  Component Value Date   CHOL 206 (H) 10/24/2019   HDL 80 10/24/2019   LDLCALC 79 10/24/2019   TRIG 393 (H) 10/24/2019   CHOLHDL 2.6 10/24/2019    He has been working on diet and exercise for prediabetes, and denies foot ulcerations, hyperglycemia, hypoglycemia , increased appetite, nausea, paresthesia of the feet, polydipsia, polyuria, visual disturbances, vomiting and weight loss.  Last A1C in the office was:  Lab Results  Component Value Date   HGBA1C 5.2 10/24/2019   Patient is on Vitamin D supplement for deficency.  Last check was not to goal. Lab Results  Component Value Date   VD25OH 46 10/24/2019     BMI is Body mass index is 29.97 kg/m., he is working on diet and exercise. Wt Readings from Last 3 Encounters:  10/24/19 221 lb (100.2 kg)  06/22/19 204 lb (92.5 kg)  05/17/19 214 lb 12.8 oz (97.4 kg)   He has a history of testosterone deficiency and is on testosterone replacement via  androgel 2pumps. He is on cialis occasionally Lab Results  Component Value Date   TESTOSTERONE 212 (L) 10/24/2019    Current Medications:  Current Outpatient Medications on File Prior to Visit  Medication Sig Dispense Refill   Bempedoic Acid-Ezetimibe (NEXLIZET) 180-10 MG TABS Take 1 tablet by mouth daily. 90 tablet 1   cholecalciferol (VITAMIN D3) 25 MCG (1000 UNIT) tablet Take 1,000 Units by mouth daily.     cyclobenzaprine (FLEXERIL) 10 MG tablet Take 1 tablet (10 mg total) by mouth 3 (three) times daily as needed for muscle spasms. 30 tablet 0   Multiple Vitamin (MULTIVITAMIN ADULT PO) Take by mouth.     Psyllium (METAMUCIL FIBER PO) Take 1 Dose by mouth daily.     No current facility-administered medications on file prior to visit.    Medical History:  Past Medical History:  Diagnosis Date   Atrial fibrillation with rapid ventricular response ([place])    GERD (gastroesophageal reflux disease)    Hyperlipidemia    Other testicular hypofunction    Unspecified vitamin D deficiency    Immunization History  Administered Date(s) Administered   PPD Test 04/03/2013   Td 11/08/2005   Tdap 06/25/2015   Tetanus: 2017 Colonoscopy: 12/2016, FM HX q 5 years due 2023 Eye Exam: 2017 Dentist: Q31months, UTD  Patient Care Team: Unk Pinto, MD as PCP - General (Internal Medicine) Sueanne Margarita, MD as PCP - Sleep Medicine (Cardiology) Milus Banister, MD as Attending Physician (Gastroenterology) Thornell Sartorius, MD as Consulting Physician (Otolaryngology) Willow Ora (Optometry) Justice Britain, MD as Consulting Physician (Orthopedic Surgery) Newman Pies, MD as Consulting Physician (Neurosurgery)  Review of Systems:  Review of Systems  Constitutional: Negative for chills, fever and malaise/fatigue.  HENT: Negative for congestion, ear discharge and sore throat.   Eyes: Negative.   Respiratory: Negative for cough, shortness of breath and wheezing.    Cardiovascular: Negative for chest pain, palpitations and leg swelling.  Gastrointestinal: Negative for abdominal pain, blood in stool, constipation, diarrhea, heartburn and melena.  Genitourinary: Negative.   Skin: Negative.   Neurological: Negative for headaches.   Allergies Allergies  Allergen Reactions   Pravachol [Pravastatin] Other (See Comments)    Fatigue    SURGICAL HISTORY He  has a past surgical history that includes Shoulder surgery (2011) and Colonoscopy. FAMILY HISTORY His family history includes Cancer in his mother; Cancer (age of onset: 42) in his maternal grandmother; Colon cancer (age of onset: 74) in his paternal uncle; Colon polyps in his father; Depression in his maternal grandmother; Heart disease in his paternal grandfather; Hyperlipidemia in his father; Lymphoma in his mother. SOCIAL HISTORY He  reports that he has been smoking cigars. He has never used smokeless tobacco. He reports current alcohol use of about 21.0 standard drinks of alcohol per week. He reports that he does not use drugs. Drinks 4 days a  week, has cut back.   Physical Exam: BP 116/80    Pulse 62    Temp 97.6 F (36.4 C)    Ht 6' (1.829 m)    Wt 221 lb (100.2 kg)    SpO2 96%    BMI 29.97 kg/m  Wt Readings from Last 3 Encounters:  10/24/19 221 lb (100.2 kg)  06/22/19 204 lb (92.5 kg)  05/17/19 214 lb 12.8 oz (97.4 kg)   General Appearance: Well nourished well developed, in no apparent distress. Eyes: PERRLA, EOMs, conjunctiva no swelling or erythema ENT/Mouth: Ear canals normal without obstruction, swelling, erythma, discharge.  TMs normal bilaterally.  Oropharynx moist, clear, without exudate, or postoropharyngeal swelling. Neck: Supple, thyroid normal,no cervical adenopathy  Respiratory: Respiratory effort normal, Breath sounds clear A&P without rhonchi, wheeze, or rale.  No retractions, no accessory usage. Cardio: RRR with no MRGs. Brisk peripheral pulses without edema.  Abdomen:  Soft, + BS,  Non tender, no guarding, rebound, hernias, masses. Musculoskeletal: Full ROM, 5/5 strength, Normal gait,  right medial ankle some erythema, swelling, No warmth, no distal edema.  Skin: Warm, dry without rashes, lesions, ecchymosis.  Neuro: Awake and oriented X 3, Cranial nerves intact. Normal muscle tone, no cerebellar symptoms. Psych: Normal affect, Insight and Judgment appropriate.   EKG: NSR Aorta Defer due to age   Garnet Sierras, NP 4:10 PM [facility]

## 2019-10-25 LAB — COMPLETE METABOLIC PANEL WITH GFR
AG Ratio: 2 (calc) (ref 1.0–2.5)
ALT: 21 U/L (ref 9–46)
AST: 25 U/L (ref 10–40)
Albumin: 4.4 g/dL (ref 3.6–5.1)
Alkaline phosphatase (APISO): 41 U/L (ref 36–130)
BUN: 15 mg/dL (ref 7–25)
CO2: 26 mmol/L (ref 20–32)
Calcium: 9.3 mg/dL (ref 8.6–10.3)
Chloride: 103 mmol/L (ref 98–110)
Creat: 1.01 mg/dL (ref 0.60–1.35)
GFR, Est African American: 101 mL/min/{1.73_m2} (ref >=60)
GFR, Est Non African American: 88 mL/min/{1.73_m2} (ref >=60)
Globulin: 2.2 g/dL (calc) (ref 1.9–3.7)
Glucose, Bld: 96 mg/dL (ref 65–99)
Potassium: 4.1 mmol/L (ref 3.5–5.3)
Sodium: 138 mmol/L (ref 135–146)
Total Bilirubin: 0.7 mg/dL (ref 0.2–1.2)
Total Protein: 6.6 g/dL (ref 6.1–8.1)

## 2019-10-25 LAB — CBC WITH DIFFERENTIAL/PLATELET
Absolute Monocytes: 594 cells/uL (ref 200–950)
Basophils Absolute: 32 cells/uL (ref 0–200)
Basophils Relative: 0.6 %
Eosinophils Absolute: 111 cells/uL (ref 15–500)
Eosinophils Relative: 2.1 %
HCT: 41.8 % (ref 38.5–50.0)
Hemoglobin: 14.5 g/dL (ref 13.2–17.1)
Lymphs Abs: 1940 cells/uL (ref 850–3900)
MCH: 33 pg (ref 27.0–33.0)
MCHC: 34.7 g/dL (ref 32.0–36.0)
MCV: 95 fL (ref 80.0–100.0)
MPV: 10.8 fL (ref 7.5–12.5)
Monocytes Relative: 11.2 %
Neutro Abs: 2624 cells/uL (ref 1500–7800)
Neutrophils Relative %: 49.5 %
Platelets: 173 10*3/uL (ref 140–400)
RBC: 4.4 10*6/uL (ref 4.20–5.80)
RDW: 11.8 % (ref 11.0–15.0)
Total Lymphocyte: 36.6 %
WBC: 5.3 10*3/uL (ref 3.8–10.8)

## 2019-10-25 LAB — URINALYSIS W MICROSCOPIC + REFLEX CULTURE
Bacteria, UA: NONE SEEN /HPF
Bilirubin Urine: NEGATIVE
Glucose, UA: NEGATIVE
Hgb urine dipstick: NEGATIVE
Hyaline Cast: NONE SEEN /LPF
Ketones, ur: NEGATIVE
Leukocyte Esterase: NEGATIVE
Nitrites, Initial: NEGATIVE
Protein, ur: NEGATIVE
RBC / HPF: NONE SEEN /HPF (ref 0–2)
Specific Gravity, Urine: 1.02 (ref 1.001–1.03)
Squamous Epithelial / HPF: NONE SEEN /HPF (ref <=5)
WBC, UA: NONE SEEN /HPF (ref 0–5)
pH: 7 (ref 5.0–8.0)

## 2019-10-25 LAB — MICROALBUMIN / CREATININE URINE RATIO
Creatinine, Urine: 155 mg/dL (ref 20–320)
Microalb, Ur: <0.2 mg/dL

## 2019-10-25 LAB — TESTOSTERONE: Testosterone: 212 ng/dL — ABNORMAL LOW (ref 250–827)

## 2019-10-25 LAB — VITAMIN D 25 HYDROXY (VIT D DEFICIENCY, FRACTURES): Vit D, 25-Hydroxy: 46 ng/mL (ref 30–100)

## 2019-10-25 LAB — HEMOGLOBIN A1C
Hgb A1c MFr Bld: 5.2 % of total Hgb (ref <5.7)
Mean Plasma Glucose: 103 (calc)
eAG (mmol/L): 5.7 (calc)

## 2019-10-25 LAB — LIPID PANEL
Cholesterol: 206 mg/dL — ABNORMAL HIGH (ref <200)
HDL: 80 mg/dL (ref >=40)
LDL Cholesterol (Calc): 79 mg/dL (calc)
Non-HDL Cholesterol (Calc): 126 mg/dL (calc) (ref <130)
Total CHOL/HDL Ratio: 2.6 (calc) (ref <5.0)
Triglycerides: 393 mg/dL — ABNORMAL HIGH (ref <150)

## 2019-10-25 LAB — MAGNESIUM: Magnesium: 2.2 mg/dL (ref 1.5–2.5)

## 2019-10-25 LAB — TSH: TSH: 1.28 mIU/L (ref 0.40–4.50)

## 2019-10-25 LAB — NO CULTURE INDICATED

## 2019-12-20 ENCOUNTER — Other Ambulatory Visit: Payer: Self-pay | Admitting: Adult Health Nurse Practitioner

## 2019-12-20 DIAGNOSIS — B009 Herpesviral infection, unspecified: Secondary | ICD-10-CM

## 2019-12-20 MED ORDER — VALACYCLOVIR HCL 500 MG PO TABS: 500.0000 mg | ORAL_TABLET | Freq: Two times a day (BID) | ORAL | 1 refills | Status: DC

## 2020-04-02 ENCOUNTER — Other Ambulatory Visit: Payer: Self-pay

## 2020-04-02 MED ORDER — NEXLIZET 180-10 MG PO TABS
1.0000 | ORAL_TABLET | Freq: Every day | ORAL | 1 refills | Status: DC
Start: 2020-04-02 — End: 2020-05-09

## 2020-04-02 NOTE — Telephone Encounter (Signed)
Refill on NEXLIZET 180/10mg 

## 2020-04-23 ENCOUNTER — Ambulatory Visit: Payer: Commercial Managed Care - PPO | Admitting: Adult Health Nurse Practitioner

## 2020-05-01 ENCOUNTER — Encounter: Payer: Self-pay | Admitting: Internal Medicine

## 2020-05-01 ENCOUNTER — Ambulatory Visit: Payer: Commercial Managed Care - PPO | Admitting: Internal Medicine

## 2020-05-01 ENCOUNTER — Other Ambulatory Visit: Payer: Self-pay

## 2020-05-01 VITALS — BP 118/82 | HR 64 | Temp 97.7°F | Resp 16 | Ht 72.0 in | Wt 228.6 lb

## 2020-05-01 DIAGNOSIS — E559 Vitamin D deficiency, unspecified: Secondary | ICD-10-CM

## 2020-05-01 DIAGNOSIS — E782 Mixed hyperlipidemia: Secondary | ICD-10-CM

## 2020-05-01 DIAGNOSIS — R7309 Other abnormal glucose: Secondary | ICD-10-CM

## 2020-05-01 DIAGNOSIS — R0989 Other specified symptoms and signs involving the circulatory and respiratory systems: Secondary | ICD-10-CM

## 2020-05-01 DIAGNOSIS — Z79899 Other long term (current) drug therapy: Secondary | ICD-10-CM

## 2020-05-01 DIAGNOSIS — Z9989 Dependence on other enabling machines and devices: Secondary | ICD-10-CM

## 2020-05-01 DIAGNOSIS — Z8249 Family history of ischemic heart disease and other diseases of the circulatory system: Secondary | ICD-10-CM | POA: Insufficient documentation

## 2020-05-01 DIAGNOSIS — I480 Paroxysmal atrial fibrillation: Secondary | ICD-10-CM

## 2020-05-01 DIAGNOSIS — E349 Endocrine disorder, unspecified: Secondary | ICD-10-CM

## 2020-05-01 DIAGNOSIS — G4733 Obstructive sleep apnea (adult) (pediatric): Secondary | ICD-10-CM

## 2020-05-01 NOTE — Patient Instructions (Signed)

## 2020-05-01 NOTE — Progress Notes (Signed)
Future Appointments  Date Time Provider [place]  05/01/2020  4:00 PM Unk Pinto, MD [facility] None  10/23/2020  2:00 PM Liane Comber, NP [facility] None     History of Present Illness:       This very nice 50 y.o.  MWM presents for 3 month follow up with HTN, HLD, Pre-Diabetes, Testosterone Deficiency and Vitamin D Deficiency.  Patient is on CPAP for OSA with improved sleep hygiene and is followed by Dr Fransico Him.        Patient is followed for labile HTN (2015) & BP has been controlled at home. In May 2015, he had Rapid  PAfib, hospitalized overnight  and spontaneously converted back to NSR. Echo cardiogram was normal.  CHADs2VASc=1 on bASA. Today's BP is at goal - 118/82.   Patient has had no complaints of any cardiac type chest pain, palpitations, dyspnea / orthopnea / PND, dizziness, claudication, or dependent edema.       Hyperlipidemia is not controlled with diet &  Nexlizet. Patient denies myalgias or other med SE's. Last Lipids were at goal except elevated Trig's:  Lab Results  Component Value Date   CHOL 206 (H) 10/24/2019   HDL 80 10/24/2019   LDLCALC 79 10/24/2019   TRIG 393 (H) 10/24/2019   CHOLHDL 2.6 10/24/2019    Also, the patient has history of PreDiabetes (A1cd 5.7% / 2015 &2016) and has had no symptoms of reactive hypoglycemia, diabetic polys, paresthesias or visual blurring.  Last A1c was at goal:  Lab Results  Component Value Date   HGBA1C 5.2 10/24/2019        Patient has Testosterone Deficiency  ("158" /2015) and is on replacement therapy with testosterone gel with  improved sense of stamina & well- being.          Further, the patient also has history of Vitamin D Deficiency ("16" /2009) and supplements vitamin D without any suspected side-effects. Last vitamin D was low (goal 70-100):  Lab Results  Component Value Date   VD25OH 46 10/24/2019    Current Outpatient Medications on File Prior to Visit  Medication Sig  .  NEXLIZET 180-10 MG TABS Take 1 tablet  daily.  Marland Kitchen VITAMIN D  5000  u Take 1 capsule  2  times daily.  . cyclobenzaprine 10 MG tablet Take 1 tablet  3  times daily as needed   . Multiple Vitamin  Take daily  . METAMUCIL  Take 1 Dose  daily.  . tadalafil 20 MG tablet Take 1/2 to 1 tablet every 2 to 3 days as needed   . ANDROGEL PUMP  (1.62%) GEL 4 pumps  daily     Allergies  Allergen Reactions  . Pravachol [Pravastatin] Other (See Comments)    Fatigue    PMHx:   Past Medical History:  Diagnosis Date  . Atrial fibrillation with rapid ventricular response ([place])   . GERD (gastroesophageal reflux disease)   . Hyperlipidemia   . Other testicular hypofunction   . vitamin D deficiency     Immunization History  Administered Date(s) Administered  . PPD Test 04/03/2013  . Td 11/08/2005  . Tdap 06/25/2015    Past Surgical History:  Procedure Laterality Date  . COLONOSCOPY    . SHOULDER SURGERY  2011    FHx:    Reviewed / unchanged  SHx:    Reviewed / unchanged   Systems Review:  Constitutional: Denies fever, chills, wt changes, headaches, insomnia, fatigue, night sweats,  change in appetite. Eyes: Denies redness, blurred vision, diplopia, discharge, itchy, watery eyes.  ENT: Denies discharge, congestion, post nasal drip, epistaxis, sore throat, earache, hearing loss, dental pain, tinnitus, vertigo, sinus pain, snoring.  CV: Denies chest pain, palpitations, irregular heartbeat, syncope, dyspnea, diaphoresis, orthopnea, PND, claudication or edema. Respiratory: denies cough, dyspnea, DOE, pleurisy, hoarseness, laryngitis, wheezing.  Gastrointestinal: Denies dysphagia, odynophagia, heartburn, reflux, water brash, abdominal pain or cramps, nausea, vomiting, bloating, diarrhea, constipation, hematemesis, melena, hematochezia  or hemorrhoids. Genitourinary: Denies dysuria, frequency, urgency, nocturia, hesitancy, discharge, hematuria or flank pain. Musculoskeletal: Denies arthralgias,  myalgias, stiffness, jt. swelling, pain, limping or strain/sprain.  Skin: Denies pruritus, rash, hives, warts, acne, eczema or change in skin lesion(s). Neuro: No weakness, tremor, incoordination, spasms, paresthesia or pain. Psychiatric: Denies confusion, memory loss or sensory loss. Endo: Denies change in weight, skin or hair change.  Heme/Lymph: No excessive bleeding, bruising or enlarged lymph nodes.  Physical Exam  BP 118/82   Pulse 64   Temp 97.7 F (36.5 C)   Resp 16   Ht 6' (1.829 m)   Wt 228 lb 9.6 oz (103.7 kg)   SpO2 94%   BMI 31.00 kg/m   Appears  well nourished, well groomed  and in no distress.  Eyes: PERRLA, EOMs, conjunctiva no swelling or erythema. Sinuses: No frontal/maxillary tenderness ENT/Mouth: EAC's clear, TM's nl w/o erythema, bulging. Nares clear w/o erythema, swelling, exudates. Oropharynx clear without erythema or exudates. Oral hygiene is good. Tongue normal, non obstructing. Hearing intact.  Neck: Supple. Thyroid not palpable. Car 2+/2+ without bruits, nodes or JVD. Chest: Respirations nl with BS clear & equal w/o rales, rhonchi, wheezing or stridor.  Cor: Heart sounds normal w/ regular rate and rhythm without sig. murmurs, gallops, clicks or rubs. Peripheral pulses normal and equal  without edema.  Abdomen: Soft & bowel sounds normal. Non-tender w/o guarding, rebound, hernias, masses or organomegaly.  Lymphatics: Unremarkable.  Musculoskeletal: Full ROM all peripheral extremities, joint stability, 5/5 strength and normal gait.  Skin: Warm, dry without exposed rashes, lesions or ecchymosis apparent.  Neuro: Cranial nerves intact, reflexes equal bilaterally. Sensory-motor testing grossly intact. Tendon reflexes grossly intact.  Pysch: Alert & oriented x 3.  Insight and judgement nl & appropriate. No ideations.  Assessment and Plan:  1. Labile hypertension  - Continue medication, monitor blood pressure at home.  - Continue DASH diet.  Reminder to go  to the ER if any CP,  SOB, nausea, dizziness, severe HA, changes vision/speech.  - CBC with Differential/Platelet - COMPLETE METABOLIC PANEL WITH GFR - Magnesium - TSH  2. Hyperlipidemia, mixed  - Continue diet/meds, exercise,& lifestyle modifications.  - Continue monitor periodic cholesterol/liver & renal functions   - Lipid panel - TSH  3. Abnormal glucose  - Continue diet, exercise  - Lifestyle modifications.  - Monitor appropriate labs  - Hemoglobin A1c - Insulin, random  4. Vitamin D deficiency  - Continue supplementation.  - VITAMIN D 25 Hydroxy  5. Paroxysmal A-fib (HCC) / CHADs2VASc=1  - TSH  6. Testosterone deficiency  - Testosterone  7. OSA on CPAP   8. Medication management  - CBC with Differential/Platelet - COMPLETE METABOLIC PANEL WITH GFR - Magnesium - Lipid panel - TSH - Hemoglobin A1c - Insulin, random - VITAMIN D 25 Hydroxy - Testosterone       Discussed  regular exercise, BP monitoring, weight control to achieve/maintain BMI less than 25 and discussed med and SE's. Recommended labs to assess and monitor clinical status with  further disposition pending results of labs.  I discussed the assessment and treatment plan with the patient. The patient was provided an opportunity to ask questions and all were answered. The patient agreed with the plan and demonstrated an understanding of the instructions.  I provided over 30 minutes of exam, counseling, chart review and  complex critical decision making.       The patient was advised to call back or seek an in-person evaluation if the symptoms worsen or if the condition fails to improve as anticipated.   Kirtland Bouchard, MD

## 2020-05-02 LAB — COMPLETE METABOLIC PANEL WITH GFR
AG Ratio: 2 (calc) (ref 1.0–2.5)
ALT: 20 U/L (ref 9–46)
AST: 18 U/L (ref 10–40)
Albumin: 4.5 g/dL (ref 3.6–5.1)
Alkaline phosphatase (APISO): 55 U/L (ref 36–130)
BUN: 18 mg/dL (ref 7–25)
CO2: 29 mmol/L (ref 20–32)
Calcium: 9.6 mg/dL (ref 8.6–10.3)
Chloride: 101 mmol/L (ref 98–110)
Creat: 0.97 mg/dL (ref 0.60–1.35)
GFR, Est African American: 106 mL/min/{1.73_m2} (ref >=60)
GFR, Est Non African American: 91 mL/min/{1.73_m2} (ref >=60)
Globulin: 2.2 g/dL (calc) (ref 1.9–3.7)
Glucose, Bld: 114 mg/dL — ABNORMAL HIGH (ref 65–99)
Potassium: 4 mmol/L (ref 3.5–5.3)
Sodium: 136 mmol/L (ref 135–146)
Total Bilirubin: 0.4 mg/dL (ref 0.2–1.2)
Total Protein: 6.7 g/dL (ref 6.1–8.1)

## 2020-05-02 LAB — HEMOGLOBIN A1C
Hgb A1c MFr Bld: 5.3 % of total Hgb (ref <5.7)
Mean Plasma Glucose: 105 mg/dL
eAG (mmol/L): 5.8 mmol/L

## 2020-05-02 LAB — CBC WITH DIFFERENTIAL/PLATELET
Absolute Monocytes: 578 cells/uL (ref 200–950)
Basophils Absolute: 42 cells/uL (ref 0–200)
Basophils Relative: 0.8 %
Eosinophils Absolute: 159 cells/uL (ref 15–500)
Eosinophils Relative: 3 %
HCT: 42.1 % (ref 38.5–50.0)
Hemoglobin: 14.2 g/dL (ref 13.2–17.1)
Lymphs Abs: 2099 cells/uL (ref 850–3900)
MCH: 31.8 pg (ref 27.0–33.0)
MCHC: 33.7 g/dL (ref 32.0–36.0)
MCV: 94.2 fL (ref 80.0–100.0)
MPV: 11.4 fL (ref 7.5–12.5)
Monocytes Relative: 10.9 %
Neutro Abs: 2422 cells/uL (ref 1500–7800)
Neutrophils Relative %: 45.7 %
Platelets: 160 10*3/uL (ref 140–400)
RBC: 4.47 10*6/uL (ref 4.20–5.80)
RDW: 11.9 % (ref 11.0–15.0)
Total Lymphocyte: 39.6 %
WBC: 5.3 10*3/uL (ref 3.8–10.8)

## 2020-05-02 LAB — LIPID PANEL
Cholesterol: 255 mg/dL — ABNORMAL HIGH (ref <200)
HDL: 73 mg/dL (ref >=40)
LDL Cholesterol (Calc): 144 mg/dL (calc) — ABNORMAL HIGH
Non-HDL Cholesterol (Calc): 182 mg/dL (calc) — ABNORMAL HIGH (ref <130)
Total CHOL/HDL Ratio: 3.5 (calc) (ref <5.0)
Triglycerides: 249 mg/dL — ABNORMAL HIGH (ref <150)

## 2020-05-02 LAB — TESTOSTERONE: Testosterone: 371 ng/dL (ref 250–827)

## 2020-05-02 LAB — TSH: TSH: 0.72 mIU/L (ref 0.40–4.50)

## 2020-05-02 LAB — MAGNESIUM: Magnesium: 2.2 mg/dL (ref 1.5–2.5)

## 2020-05-02 LAB — VITAMIN D 25 HYDROXY (VIT D DEFICIENCY, FRACTURES): Vit D, 25-Hydroxy: 60 ng/mL (ref 30–100)

## 2020-05-02 LAB — INSULIN, RANDOM: Insulin: 10.3 u[IU]/mL

## 2020-05-02 NOTE — Progress Notes (Signed)
============================================================ -   Test results slightly outside the reference range are not unusual. If there is anything important, I will review this with you,  otherwise it is considered normal test values.  If you have further questions,  please do not hesitate to contact me at the office or via My Chart.  ============================================================ ============================================================  -  Total Chol = 255 - Very Elevated              (  Ideal or Goal is less than 180  !  )    - and   - Bad /Dangerous LDL Chol = 144 - Also Bad !             (  Ideal or Goal is less than 70  !  )   - So you definitely need the medicine   - if Caren Griffins is unable to get approved thru your insurance,   I'll submit a referral to the [facility] to see Dr Debara Pickett   - sometimes they're able to get the approvals easier ============================================================ ============================================================  - Also diet is very Important- Recommend a stricter low cholesterol diet   - Cholesterol only comes from animal sources  - ie. meat, dairy, egg yolks  - Eat all the vegetables you want.  - Avoid meat, especially red meat - Beef AND Pork .  - Avoid cheese & dairy - milk & ice cream.     - Cheese is the most concentrated form of trans-fats which  is the worst thing to clog up our arteries.   - Veggie cheese is OK which can be found in the fresh  produce section at [employer] or [employer] or [employer] ============================================================ ============================================================  -  Also Triglycerides (  249   ) or fats in blood are too high  (goal is less than 150)    - Recommend avoid fried & greasy foods,  sweets / candy,   - Avoid white rice  (brown or wild rice or Quinoa is OK),   - Avoid white potatoes   (sweet potatoes are OK)   - Avoid anything made from white flour  - bagels, doughnuts, rolls, buns, biscuits, white and   wheat breads, pizza crust and traditional  pasta made of white flour & egg white  - (vegetarian pasta or spinach or wheat pasta is OK).    - Multi-grain bread is OK - like multi-grain flat bread or  sandwich thins.   - Avoid alcohol in excess.   - Exercise is also important. ============================================================ ============================================================  -  A1c - Normal - Great - No Diabetes  ! ============================================================ ============================================================  -  Vitamin D = 60 - Excellent  ============================================================ ============================================================  -  Testosterone is in Normal Range ============================================================ ============================================================  -  All Else - CBC - Kidneys - Electrolytes - Liver - Magnesium & Thyroid    - all  Normal / OK ============================================================ ============================================================

## 2020-05-06 ENCOUNTER — Other Ambulatory Visit: Payer: Self-pay | Admitting: Internal Medicine

## 2020-05-06 DIAGNOSIS — E782 Mixed hyperlipidemia: Secondary | ICD-10-CM

## 2020-05-09 ENCOUNTER — Telehealth: Payer: Self-pay | Admitting: Cardiovascular Disease

## 2020-05-09 ENCOUNTER — Telehealth: Payer: Self-pay | Admitting: Internal Medicine

## 2020-05-09 ENCOUNTER — Other Ambulatory Visit: Payer: Self-pay | Admitting: Internal Medicine

## 2020-05-09 DIAGNOSIS — E782 Mixed hyperlipidemia: Secondary | ICD-10-CM

## 2020-05-09 DIAGNOSIS — Z789 Other specified health status: Secondary | ICD-10-CM | POA: Insufficient documentation

## 2020-05-09 MED ORDER — EZETIMIBE 10 MG PO TABS
ORAL_TABLET | ORAL | 1 refills | Status: DC
Start: 2020-05-09 — End: 2020-05-13

## 2020-05-09 NOTE — Telephone Encounter (Signed)
Patient calling the office for samples of medication:   1.  What medication and dosage are you requesting samples for?   Bempedoic Acid-Ezetimibe (NEXLIZET) 180-10 MG TABS    2.  Are you currently out of this medication? Has one left

## 2020-05-09 NOTE — Telephone Encounter (Signed)
Called pt to inform him that we do not have samples of bempedoic acid-Ezetimibe (Nexlizet), but it could leave him a $10 co-pay card at the [facility] office at the front desk for pt to pick up. I advised the pt that if he has any other problems, questions or concerns, to give our office a call back. Pt verbalized understanding.

## 2020-05-09 NOTE — Telephone Encounter (Signed)
Nexlizet 180mg  DENIED by [employer]. Referring to Dr Debara Pickett, [facility] for evaluation. Patient has no more samples, Dr Melford Aase sent in rx Zetia until patient can be see w/ [facility].

## 2020-05-13 ENCOUNTER — Other Ambulatory Visit: Payer: Self-pay | Admitting: *Deleted

## 2020-05-13 DIAGNOSIS — E782 Mixed hyperlipidemia: Secondary | ICD-10-CM

## 2020-05-13 MED ORDER — EZETIMIBE 10 MG PO TABS
ORAL_TABLET | ORAL | 0 refills | Status: DC
Start: 2020-05-13 — End: 2021-01-02

## 2020-06-25 ENCOUNTER — Encounter: Payer: Self-pay | Admitting: Internal Medicine

## 2020-06-25 ENCOUNTER — Telehealth (INDEPENDENT_AMBULATORY_CARE_PROVIDER_SITE_OTHER): Payer: Commercial Managed Care - PPO | Admitting: Internal Medicine

## 2020-06-25 VITALS — BP 120/80 | HR 64 | Ht 73.0 in | Wt 208.0 lb

## 2020-06-25 DIAGNOSIS — G4733 Obstructive sleep apnea (adult) (pediatric): Secondary | ICD-10-CM | POA: Diagnosis not present

## 2020-06-25 DIAGNOSIS — Z789 Other specified health status: Secondary | ICD-10-CM | POA: Diagnosis not present

## 2020-06-25 DIAGNOSIS — E782 Mixed hyperlipidemia: Secondary | ICD-10-CM | POA: Diagnosis not present

## 2020-06-25 DIAGNOSIS — I480 Paroxysmal atrial fibrillation: Secondary | ICD-10-CM | POA: Diagnosis not present

## 2020-06-25 NOTE — Progress Notes (Signed)
Virtual Visit via Video Note   This visit type was conducted due to national recommendations for restrictions regarding the COVID-19 Pandemic (e.g. social distancing) in an effort to limit this patient's exposure and mitigate transmission in our community.  Due to his co-morbid illnesses, this patient is at least at moderate risk for complications without adequate follow up.  This format is felt to be most appropriate for this patient at this time.  All issues noted in this document were discussed and addressed.  A limited physical exam was performed with this format.  Please refer to the patient's chart for his consent to telehealth for [facility].      Date:  06/25/2020   ID:  Michael Kim, DOB 1970-08-20, MRN [id number] The patient was identified using 2 identifiers.  Evaluation Performed:  New Patient Evaluation  Patient Location:  [address] [place] [address]  Provider location:   [address] [place] [address] [place], [place] [address]  PCP:  Unk Pinto, MD  Cardiologist:  None Electrophysiologist:  None   Chief Complaint:  Dyslipidemia  History of Present Illness:    Michael Kim is a 50 y.o. male who presents via audio/video conferencing for a telehealth visit today.  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 Acie Fredrickson and Dr. Radford Pax for atrial fibrillation and obstructive sleep apnea, respectively.  He is now compliant on CPAP and has not had any symptomatic atrial fibrillation for the past 4 years.  He also has testosterone deficiency and vitamin D deficiency which have been repleted and longstanding dyslipidemia.  His father had atrial fibrillation but no known early onset heart disease in the family.  He reports a history of high triglycerides and elevated cholesterol but could not take statins, namely pravastatin and rosuvastatin caused him to feel weird.  Subsequently he has been on  ezetimibe and then was placed on Nexletol that.  Is not clear whether he had prior authorization or not however the prescription was filled for some period of time.  He said he tolerated it very well and his cholesterol improved accordingly.  Total was 206, HDL 80, triglycerides 393 and LDL 79 in October 2021, however LDL has been as high as 186 in the past.  Repeat labs a month ago however showed an increase to 255, HDL 73, triglycerides 249 and LDL 144.  It appears he has increased lipids across the board both with high HDL cholesterol, triglycerides and LDL cholesterol.  This suggest probably genetic dyslipidemia however its not clear whether his HDL may be cardioprotective.  His overall total cholesterol/HDL ratio is favorable however non-HDL cholesterol is high at 182, primarily driven by high triglycerides.  He has not been assessed.  It should be noted that all of the patient's lab work has been nonfasting.  While guidelines indicate that this is acceptable practice at this time, however the triglycerides can be higher up to 30% more than fasting labs.  He reports he is trying to make dietary changes as well.  The patient does not have symptoms concerning for COVID-19 infection (fever, chills, cough, or new SHORTNESS OF BREATH).    Prior CV studies:   The following studies were reviewed today:  Chart reviewed, lab work  PMHx:  Past Medical History:  Diagnosis Date   Atrial fibrillation with rapid ventricular response ([place])    GERD (gastroesophageal reflux disease)    Hyperlipidemia    Other testicular  hypofunction    Unspecified vitamin D deficiency     Past Surgical History:  Procedure Laterality Date   COLONOSCOPY     SHOULDER SURGERY  2011    FAMHx:  Family History  Problem Relation Age of Onset   Colon polyps Father    Hyperlipidemia Father    Lymphoma Mother    Cancer Mother        lymphoma   Colon cancer Paternal Uncle 36   Depression Maternal Grandmother    Cancer  Maternal Grandmother 47       fatal colon   Heart disease Paternal Grandfather    Stomach cancer Neg Hx     SOCHx:   reports that he has been smoking cigars. He has never used smokeless tobacco. He reports current alcohol use of about 21.0 standard drinks of alcohol per week. He reports that he does not use drugs.  ALLERGIES:  Allergies  Allergen Reactions   Pravachol [Pravastatin] Other (See Comments)    Fatigue    MEDS:  Current Meds  Medication Sig   Cholecalciferol (VITAMIN D) 125 MCG (5000 UT) CAPS Take 1 capsule by mouth 2 (two) times daily.   cyclobenzaprine (FLEXERIL) 10 MG tablet Take 1 tablet (10 mg total) by mouth 3 (three) times daily as needed for muscle spasms.   ezetimibe (ZETIA) 10 MG tablet Take  1 tablet  Daily  for Cholesterol   Multiple Vitamin (MULTIVITAMIN ADULT PO) Take by mouth.   Psyllium (METAMUCIL FIBER PO) Take 1 Dose by mouth daily.   tadalafil (CIALIS) 20 MG tablet Take 1/2 to 1 tablet every 2 to 3 days as needed for [name]   Testosterone (ANDROGEL PUMP) 20.25 MG/ACT (1.62%) GEL 2 pumps daily on each arm (4 pumps total) daily     ROS: Pertinent items noted in HPI and remainder of comprehensive ROS otherwise negative.  Labs/Other Tests and Data Reviewed:    Recent Labs: 05/01/2020: ALT 20; BUN 18; Creat 0.97; Hemoglobin 14.2; Magnesium 2.2; Platelets 160; Potassium 4.0; Sodium 136; TSH 0.72   Recent Lipid Panel Lab Results  Component Value Date/Time   CHOL 255 (H) 05/01/2020 03:48 PM   TRIG 249 (H) 05/01/2020 03:48 PM   HDL 73 05/01/2020 03:48 PM   CHOLHDL 3.5 05/01/2020 03:48 PM   LDLCALC 144 (H) 05/01/2020 03:48 PM    Wt Readings from Last 3 Encounters:  06/25/20 208 lb (94.3 kg)  05/01/20 228 lb 9.6 oz (103.7 kg)  10/24/19 221 lb (100.2 kg)     Exam:    Vital Signs:  BP 120/80   Pulse 64   Ht 6\' 1"  (1.854 m)   Wt 208 lb (94.3 kg)   BMI 27.44 kg/m    General appearance: alert and no distress Lungs: No visual respiratory  difficulty Abdomen: Mildly overweight Extremities: extremities normal, atraumatic, no cyanosis or edema Skin: Skin color, texture, turgor normal. No rashes or lesions Neurologic: Grossly normal Psych: Pleasant  ASSESSMENT & PLAN:    Mixed dyslipidemia with high HDL, LDL and triglycerides History of atrial fibrillation OSA on CPAP Low testosterone Vitamin D deficiency  Mr. Hauss has a mixed dyslipidemia with high HDL, LDL and triglycerides.  He has no family history of early onset heart disease.  He does have a history of A. fib and had undiagnosed sleep apnea which is now well treated.  He also had low testosterone and vitamin D deficiency which recently have been repleted.  His last labs were in April 2022.  I  like to reassess his blood work but do a more extensive lipid NMR profile to determine if his high HDL cholesterol may be cardioprotective.  We will also get an APO B level, due to an elevated non-HDL cholesterol.  Commending a coronary calcium score.  If this is 0 or very low risk, then we may need to consider other options.  It seems as if he was denied the Nexletol probably because the indications are for adjunct of therapy to statins in patients with known ASCVD, neither of which he meets he may very well be statin intolerant however it seems that he is only tried to prior statins with atypical side effects.  He seemed amenable to considering another statin option if necessary.  Currently he is on ezetimibe which should give him some additional benefit as well.  I will contact him with results of his repeat labs and calcium score and will make a plan after that with follow-up likely and repeat lipids in about 3 to 4 months thereafter.  Thanks again for the kind referral.  COVID-19 Education: The signs and symptoms of COVID-19 were discussed with the patient and how to seek care for testing (follow up with PCP or arrange E-visit).  The importance of social distancing was discussed  today.  Patient Risk:   After full review of this patients clinical status, I feel that they are at least moderate risk at this time.  Time:   Today, I have spent 25 minutes with the patient with telehealth technology discussing dyslipidemia, afib, statin intolerance, Nexletol mechanism and indications.     Medication Adjustments/Labs and Tests Ordered: Current medicines are reviewed at length with the patient today.  Concerns regarding medicines are outlined above.   Tests Ordered: No orders of the defined types were placed in this encounter.   Medication Changes: No orders of the defined types were placed in this encounter.   Disposition:  in 3 month(s)  Pixie Casino, MD, [facility], [place] Director of the [facility]  [facility] Clinic Diplomate of the American Board of Clinical Lipidology Attending Cardiologist  Direct Dial: [phone]  Fax: [phone]  Website:  [email or web address]  Pixie Casino, MD  06/25/2020 9:23 AM

## 2020-06-25 NOTE — Patient Instructions (Signed)
Medication Instructions:  No Changes In Medications at this time.  *If you need a refill on your cardiac medications before your next appointment, please call your pharmacy*  Lab Work: PLEASE RETURN TO OFFICE FOR FASTING LIPID BLOOD WORK- NO APPOINTMENT NEEDED. THE LAB IS OPEN Monday-Friday FROM 8AM-4PM If you have labs (blood work) drawn today and your tests are completely normal, you will receive your results only by: [place] (if you have MyChart) OR A paper copy in the mail If you have any lab test that is abnormal or we need to change your treatment, we will call you to review the results.  Testing/Procedures: Dr. Debara Pickett has ordered a CT coronary calcium score. This test is done at [address]. This is $99 out of pocket.  Coronary CalciumScan A coronary calcium scan is an imaging test used to look for deposits of calcium and other fatty materials (plaques) in the inner lining of the blood vessels of the heart (coronary arteries). These deposits of calcium and plaques can partly clog and narrow the coronary arteries without producing any symptoms or warning signs. This puts a person at risk for a heart attack. This test can detect these deposits before symptoms develop. Tell a health care provider about: Any allergies you have. All medicines you are taking, including vitamins, herbs, eye drops, creams, and over-the-counter medicines. Any problems you or family members have had with anesthetic medicines. Any blood disorders you have. Any surgeries you have had. Any medical conditions you have. Whether you are pregnant or may be pregnant. What are the risks? Generally, this is a safe procedure. However, problems may occur, including: Harm to a pregnant woman and her unborn baby. This test involves the use of radiation. Radiation exposure can be dangerous to a pregnant woman and her unborn baby. If you are pregnant, you generally should not have this procedure  done. Slight increase in the risk of cancer. This is because of the radiation involved in the test. What happens before the procedure? No preparation is needed for this procedure. What happens during the procedure? You will undress and remove any jewelry around your neck or chest. You will put on a hospital gown. Sticky electrodes will be placed on your chest. The electrodes will be connected to an electrocardiogram (ECG) machine to record a tracing of the electrical activity of your heart. A CT scanner will take pictures of your heart. During this time, you will be asked to lie still and hold your breath for 2-3 seconds while a picture of your heart is being taken. The procedure may vary among health care providers and hospitals. What happens after the procedure? You can get dressed. You can return to your normal activities. It is up to you to get the results of your test. Ask your health care provider, or the department that is doing the test, when your results will be ready. Summary A coronary calcium scan is an imaging test used to look for deposits of calcium and other fatty materials (plaques) in the inner lining of the blood vessels of the heart (coronary arteries). Generally, this is a safe procedure. Tell your health care provider if you are pregnant or may be pregnant. No preparation is needed for this procedure. A CT scanner will take pictures of your heart. You can return to your normal activities after the scan is done. This information is not intended to replace advice given to you by your health care provider. Make sure  you discuss any questions you have with your health care provider. Document Released: 06/19/2007 Document Revised: 11/10/2015 Document Reviewed: 11/10/2015 Elsevier Interactive Patient Education  2017 [place]: At [facility], you and your health needs are our priority.  As part of our continuing mission to provide you with exceptional heart  care, we have created designated Provider Care Teams.  These Care Teams include your primary Cardiologist (physician) and Advanced Practice Providers (APPs -  Physician Assistants and Nurse Practitioners) who all work together to provide you with the care you need, when you need it.  We recommend signing up for the patient portal called "MyChart".  Sign up information is provided on this After Visit Summary.  MyChart is used to connect with patients for Virtual Visits (Telemedicine).  Patients are able to view lab/test results, encounter notes, upcoming appointments, etc.  Non-urgent messages can be sent to your provider as well.   To learn more about what you can do with MyChart, go to [email or web address].    Your next appointment:   3 month(s)  LIPID CLINIC APPOINTMENT   The format for your next appointment:   In Person  Provider:   K. Mali Hilty, MD

## 2020-07-22 ENCOUNTER — Ambulatory Visit: Payer: Commercial Managed Care - PPO | Admitting: Internal Medicine

## 2020-07-30 ENCOUNTER — Other Ambulatory Visit: Payer: Self-pay

## 2020-07-30 ENCOUNTER — Ambulatory Visit (INDEPENDENT_AMBULATORY_CARE_PROVIDER_SITE_OTHER)
Admission: RE | Admit: 2020-07-30 | Discharge: 2020-07-30 | Disposition: A | Discharge status: Home / Self Care | Payer: Self-pay | Source: Ambulatory Visit | Attending: Internal Medicine | Admitting: Internal Medicine

## 2020-07-30 DIAGNOSIS — I4891 Unspecified atrial fibrillation: Secondary | ICD-10-CM

## 2020-08-20 ENCOUNTER — Other Ambulatory Visit: Payer: Self-pay | Admitting: Adult Health Nurse Practitioner

## 2020-08-20 DIAGNOSIS — E349 Endocrine disorder, unspecified: Secondary | ICD-10-CM

## 2020-10-21 NOTE — Progress Notes (Signed)
COMPLETE PHYSICAL   Assessment and Plan:   Michael Kim was seen today for annual exam.  Diagnoses and all orders for this visit:  Encounter for general adult medical examination with abnormal findings Yearly -     CBC with Differential/Platelet -     COMPLETE METABOLIC PANEL WITH GFR  Hypertension, unspecified type Controlled today Monitor blood pressure at home; call if consistently over 130/80 Continue DASH diet.   Reminder to go to the ER if any CP, SOB, nausea, dizziness, severe HA, changes vision/speech, left arm numbness and tingling and jaw pain. -     Magnesium  Mixed hyperlipidemia Coronary calcium score 0, no current medication Discussed dietary and exercise modifications Low fat diet -     Lipid panel  Vitamin D deficiency Continue supplementation to maintain goal of 70-100 Taking Vitamin D 1,000 IU daily -     VITAMIN D 25 Hydroxy (Vit-D Deficiency, Fractures)  Paroxysmal Atrial Fibrillation(HCC) Continue CPAP, low dose aspirin daily Avoid ETHO, caffeine Contiue follow u with cardiology -     EKG 12-Lead  OSA on CPAP Continue CPAP/BiPAP, has not been using regularly Helping with daytime fatigue Weight loss still advised Discussed mask & tubing hygeine  Testosterone deficiency -     Testosterone (ANDROGEL PUMP) 20.25 MG/ACT (1.62%) GEL; 2 pumps daily on each arm (4 pumps total) daily -     tadalafil (CIALIS) 20 MG tablet; Take 1/2 to 1 tablet every 2 to 3 days as needed for [name]  Family history of colon cancer UTD next 2023  Medication management Continued  BMI 29.0-29.9,adult Discussed dietary and exercise modifications  Screening for ischemic heart disease -     EKG  Screening for blood or protein in urine -     Urinalysis w microscopic + reflex cultur -     Microalbumin / creatinine urine ratio  Influenza vaccination declined Discussed with patient   Further disposition pending results if labs check today. Discussed med's effects and SE's.    Over 30 minutes of face to face interview, exam, counseling, chart review, and critical decision making was performed.    Future Appointments  Date Time Provider [place]  10/26/2021  2:00 PM Magda Bernheim, NP [facility] None    HPI 50 y.o. male  presents for complete physical and follow up on HTN, HLD,  Prediabetes, OSA on CPAP, testosterone deficency and vitamin D.  He continues to teach golf.    His blood pressure has been controlled at home, today their BP is BP: 120/82.  BP Readings from Last 3 Encounters:  10/23/20 120/82  06/25/20 120/80  05/01/20 118/82     He does workout. He denies chest pain, shortness of breath, dizziness. He has history of Afib, folllows cardio CHADSVAC 1, on bASA. He is on CPAP,  helps with his sleep and energy.    He is on cholesterol medication, zetia and denies myalgias. His cholesterol is not at goal. The cholesterol last visit was:  Did very well on Nexlizet but insurance does not cover now. Cardiac calcium score  is zero 07/30/20. Lab Results  Component Value Date   CHOL 255 (H) 05/01/2020   HDL 73 05/01/2020   LDLCALC 144 (H) 05/01/2020   TRIG 249 (H) 05/01/2020   CHOLHDL 3.5 05/01/2020    He has been working on diet and exercise for prediabetes, and denies foot ulcerations, hyperglycemia, hypoglycemia , increased appetite, nausea, paresthesia of the feet, polydipsia, polyuria, visual disturbances, vomiting and weight loss. Last A1C  in the office was:  Lab Results  Component Value Date   HGBA1C 5.3 05/01/2020   Patient is on Vitamin D supplement for deficency.  Last check was not to goal. Lab Results  Component Value Date   VD25OH 60 05/01/2020     BMI is Body mass index is 30.16 kg/m., he is working on diet and exercise.Lots of activity with his job has not been doing additional exercise. Wt Readings from Last 3 Encounters:  10/23/20 222 lb 6.4 oz (100.9 kg)  06/25/20 208 lb (94.3 kg)  05/01/20 228 lb 9.6 oz (103.7 kg)    He has a history of testosterone deficiency and is on testosterone replacement via androgel 2pumps. He is on cialis occasionally Lab Results  Component Value Date   TESTOSTERONE 371 05/01/2020    Current Medications:  Current Outpatient Medications on File Prior to Visit  Medication Sig Dispense Refill   Cholecalciferol (VITAMIN D) 125 MCG (5000 UT) CAPS Take 1 capsule by mouth 2 (two) times daily.     Multiple Vitamin (MULTIVITAMIN ADULT PO) Take by mouth.     Psyllium (METAMUCIL FIBER PO) Take 1 Dose by mouth daily.     tadalafil (CIALIS) 20 MG tablet Take 1/2 to 1 tablet every 2 to 3 days as needed for [name] 30 tablet 0   Testosterone 1.62 % GEL APPLY 2 PUMPS ON EACH ARM  (4 PUMPS TOTAL) ONCE DAILY 450 g 0   cyclobenzaprine (FLEXERIL) 10 MG tablet Take 1 tablet (10 mg total) by mouth 3 (three) times daily as needed for muscle spasms. (Patient not taking: Reported on 10/23/2020) 30 tablet 0   ezetimibe (ZETIA) 10 MG tablet Take  1 tablet  Daily  for Cholesterol (Patient not taking: Reported on 10/23/2020) 60 tablet 0   No current facility-administered medications on file prior to visit.    Medical History:  Past Medical History:  Diagnosis Date   Atrial fibrillation with rapid ventricular response ([place])    GERD (gastroesophageal reflux disease)    Hyperlipidemia    Other testicular hypofunction    Unspecified vitamin D deficiency    Immunization History  Administered Date(s) Administered   PPD Test 04/03/2013   Td 11/08/2005   Tdap 06/25/2015   Tetanus: 2017 Colonoscopy: 12/2016, FM HX q 5 years due 2023 Eye Exam: 2017 Dentist: Q68months, UTD  Patient Care Team: Unk Pinto, MD as PCP - General (Internal Medicine) Sueanne Margarita, MD as PCP - Sleep Medicine (Cardiology) Milus Banister, MD as Attending Physician (Gastroenterology) Thornell Sartorius, MD as Consulting Physician (Otolaryngology) Willow Ora (Optometry) Justice Britain, MD as Consulting Physician  (Orthopedic Surgery) Newman Pies, MD as Consulting Physician (Neurosurgery)   Allergies Allergies  Allergen Reactions   Pravachol [Pravastatin] Other (See Comments)    Fatigue   Rosuvastatin Other (See Comments)    Felt strange    SURGICAL HISTORY He  has a past surgical history that includes Shoulder surgery (2011) and Colonoscopy. FAMILY HISTORY His family history includes Cancer in his mother; Cancer (age of onset: 45) in his maternal grandmother; Colon cancer (age of onset: 67) in his paternal uncle; Colon polyps in his father; Depression in his maternal grandmother; Heart disease in his paternal grandfather; Hyperlipidemia in his father; Lymphoma in his mother. SOCIAL HISTORY He  reports that he has been smoking cigars. He has never used smokeless tobacco. He reports current alcohol use of about 21.0 standard drinks per week. He reports that he does not use drugs. Drinks 4  days a week, has cut back.    Review of Systems  Constitutional:  Negative for chills and fever.  HENT:  Negative for congestion, hearing loss, sinus pain, sore throat and tinnitus.   Eyes:  Negative for blurred vision and double vision.  Respiratory:  Negative for cough, hemoptysis, sputum production, shortness of breath and wheezing.   Cardiovascular:  Negative for chest pain, palpitations and leg swelling.  Gastrointestinal:  Negative for abdominal pain, constipation, diarrhea, heartburn, nausea and vomiting.  Genitourinary:  Negative for dysuria and urgency.  Musculoskeletal:  Negative for back pain, falls, joint pain, myalgias and neck pain.  Skin:  Negative for rash.  Neurological:  Negative for dizziness, tingling, tremors, weakness and headaches.  Endo/Heme/Allergies:  Does not bruise/bleed easily.  Psychiatric/Behavioral:  Negative for depression and suicidal ideas. The patient is not nervous/anxious and does not have insomnia.      Physical Exam: BP 120/82   Pulse 74   Temp (!) 97.5 F  (36.4 C)   Ht 6' (1.829 m)   Wt 222 lb 6.4 oz (100.9 kg)   SpO2 98%   BMI 30.16 kg/m  Wt Readings from Last 3 Encounters:  10/23/20 222 lb 6.4 oz (100.9 kg)  06/25/20 208 lb (94.3 kg)  05/01/20 228 lb 9.6 oz (103.7 kg)   General Appearance: Well nourished well developed, in no apparent distress. Eyes: PERRLA, EOMs, conjunctiva no swelling or erythema ENT/Mouth: Ear canals normal without obstruction, swelling, erythma, discharge.  TMs normal bilaterally.  Oropharynx moist, clear, without exudate, or postoropharyngeal swelling. Neck: Supple, thyroid normal,no cervical adenopathy  Respiratory: Respiratory effort normal, Breath sounds clear A&P without rhonchi, wheeze, or rale.  No retractions, no accessory usage. Cardio: RRR with no MRGs. Brisk peripheral pulses without edema.  Abdomen: Soft, + BS,  Non tender, no guarding, rebound, hernias, masses. Musculoskeletal: Full ROM, 5/5 strength, Normal gait,  right medial ankle some erythema, swelling, No warmth, no distal edema.  Skin: Warm, dry without rashes, lesions, ecchymosis.  Neuro: Awake and oriented X 3, Cranial nerves intact. Normal muscle tone, no cerebellar symptoms. Psych: Normal affect, Insight and Judgment appropriate.   EKG: NSR, no ST changes Aorta Defer due to age   Magda Bernheim, NP 4:10 PM [facility]

## 2020-10-23 ENCOUNTER — Encounter: Payer: Self-pay | Admitting: Nurse Practitioner

## 2020-10-23 ENCOUNTER — Other Ambulatory Visit: Payer: Self-pay

## 2020-10-23 ENCOUNTER — Ambulatory Visit (INDEPENDENT_AMBULATORY_CARE_PROVIDER_SITE_OTHER): Payer: Commercial Managed Care - PPO | Admitting: Nurse Practitioner

## 2020-10-23 ENCOUNTER — Encounter: Payer: Commercial Managed Care - PPO | Admitting: Adult Health Nurse Practitioner

## 2020-10-23 VITALS — BP 120/82 | HR 74 | Temp 97.5°F | Ht 72.0 in | Wt 222.4 lb

## 2020-10-23 DIAGNOSIS — E663 Overweight: Secondary | ICD-10-CM

## 2020-10-23 DIAGNOSIS — Z136 Encounter for screening for cardiovascular disorders: Secondary | ICD-10-CM

## 2020-10-23 DIAGNOSIS — E782 Mixed hyperlipidemia: Secondary | ICD-10-CM

## 2020-10-23 DIAGNOSIS — E349 Endocrine disorder, unspecified: Secondary | ICD-10-CM

## 2020-10-23 DIAGNOSIS — Z0001 Encounter for general adult medical examination with abnormal findings: Secondary | ICD-10-CM

## 2020-10-23 DIAGNOSIS — I480 Paroxysmal atrial fibrillation: Secondary | ICD-10-CM

## 2020-10-23 DIAGNOSIS — R7309 Other abnormal glucose: Secondary | ICD-10-CM

## 2020-10-23 DIAGNOSIS — I10 Essential (primary) hypertension: Secondary | ICD-10-CM | POA: Diagnosis not present

## 2020-10-23 DIAGNOSIS — Z79899 Other long term (current) drug therapy: Secondary | ICD-10-CM

## 2020-10-23 DIAGNOSIS — Z789 Other specified health status: Secondary | ICD-10-CM

## 2020-10-23 DIAGNOSIS — Z0000 Encounter for general adult medical examination without abnormal findings: Secondary | ICD-10-CM | POA: Diagnosis not present

## 2020-10-23 DIAGNOSIS — E559 Vitamin D deficiency, unspecified: Secondary | ICD-10-CM

## 2020-10-23 DIAGNOSIS — Z1389 Encounter for screening for other disorder: Secondary | ICD-10-CM

## 2020-10-23 DIAGNOSIS — I4891 Unspecified atrial fibrillation: Secondary | ICD-10-CM

## 2020-10-23 DIAGNOSIS — G4733 Obstructive sleep apnea (adult) (pediatric): Secondary | ICD-10-CM

## 2020-10-23 NOTE — Patient Instructions (Signed)

## 2020-10-24 LAB — CBC WITH DIFFERENTIAL/PLATELET
Absolute Monocytes: 844 cells/uL (ref 200–950)
Basophils Absolute: 47 cells/uL (ref 0–200)
Basophils Relative: 0.7 %
Eosinophils Absolute: 141 cells/uL (ref 15–500)
Eosinophils Relative: 2.1 %
HCT: 45.5 % (ref 38.5–50.0)
Hemoglobin: 15.8 g/dL (ref 13.2–17.1)
Lymphs Abs: 2332 cells/uL (ref 850–3900)
MCH: 32.8 pg (ref 27.0–33.0)
MCHC: 34.7 g/dL (ref 32.0–36.0)
MCV: 94.4 fL (ref 80.0–100.0)
MPV: 10.7 fL (ref 7.5–12.5)
Monocytes Relative: 12.6 %
Neutro Abs: 3337 cells/uL (ref 1500–7800)
Neutrophils Relative %: 49.8 %
Platelets: 177 10*3/uL (ref 140–400)
RBC: 4.82 10*6/uL (ref 4.20–5.80)
RDW: 12.1 % (ref 11.0–15.0)
Total Lymphocyte: 34.8 %
WBC: 6.7 10*3/uL (ref 3.8–10.8)

## 2020-10-24 LAB — COMPLETE METABOLIC PANEL WITH GFR
AG Ratio: 1.8 (calc) (ref 1.0–2.5)
ALT: 40 U/L (ref 9–46)
AST: 29 U/L (ref 10–40)
Albumin: 4.7 g/dL (ref 3.6–5.1)
Alkaline phosphatase (APISO): 56 U/L (ref 36–130)
BUN: 17 mg/dL (ref 7–25)
CO2: 24 mmol/L (ref 20–32)
Calcium: 9.7 mg/dL (ref 8.6–10.3)
Chloride: 98 mmol/L (ref 98–110)
Creat: 0.75 mg/dL (ref 0.60–1.29)
Globulin: 2.6 g/dL (calc) (ref 1.9–3.7)
Glucose, Bld: 78 mg/dL (ref 65–99)
Potassium: 4.1 mmol/L (ref 3.5–5.3)
Sodium: 135 mmol/L (ref 135–146)
Total Bilirubin: 0.7 mg/dL (ref 0.2–1.2)
Total Protein: 7.3 g/dL (ref 6.1–8.1)
eGFR: 111 mL/min/{1.73_m2} (ref >=60)

## 2020-10-24 LAB — LIPID PANEL
Cholesterol: 292 mg/dL — ABNORMAL HIGH (ref <200)
HDL: 74 mg/dL (ref >=40)
LDL Cholesterol (Calc): 179 mg/dL (calc) — ABNORMAL HIGH
Non-HDL Cholesterol (Calc): 218 mg/dL (calc) — ABNORMAL HIGH (ref <130)
Total CHOL/HDL Ratio: 3.9 (calc) (ref <5.0)
Triglycerides: 217 mg/dL — ABNORMAL HIGH (ref <150)

## 2020-10-24 LAB — URINALYSIS, ROUTINE W REFLEX MICROSCOPIC
Bilirubin Urine: NEGATIVE
Glucose, UA: NEGATIVE
Hgb urine dipstick: NEGATIVE
Ketones, ur: NEGATIVE
Leukocytes,Ua: NEGATIVE
Nitrite: NEGATIVE
Protein, ur: NEGATIVE
Specific Gravity, Urine: 1.015 (ref 1.001–1.035)
pH: 6 (ref 5.0–8.0)

## 2020-10-24 LAB — TESTOSTERONE: Testosterone: 437 ng/dL (ref 250–827)

## 2020-10-24 LAB — HEMOGLOBIN A1C
Hgb A1c MFr Bld: 5.1 % of total Hgb (ref <5.7)
Mean Plasma Glucose: 100 mg/dL
eAG (mmol/L): 5.5 mmol/L

## 2020-10-24 LAB — MICROALBUMIN / CREATININE URINE RATIO
Creatinine, Urine: 96 mg/dL (ref 20–320)
Microalb Creat Ratio: 4 mcg/mg creat (ref <30)
Microalb, Ur: 0.4 mg/dL

## 2020-10-24 LAB — MAGNESIUM: Magnesium: 2 mg/dL (ref 1.5–2.5)

## 2020-10-24 LAB — VITAMIN D 25 HYDROXY (VIT D DEFICIENCY, FRACTURES): Vit D, 25-Hydroxy: 60 ng/mL (ref 30–100)

## 2020-10-24 LAB — TSH: TSH: 0.99 mIU/L (ref 0.40–4.50)

## 2020-11-07 MED ORDER — ATORVASTATIN CALCIUM 40 MG PO TABS: 40.0000 mg | ORAL_TABLET | Freq: Every day | ORAL | 3 refills | Status: DC

## 2020-11-07 NOTE — Telephone Encounter (Signed)
Cholesterol remains high - recommend atorvastatin 40 mg daily - in the morning. Needs repeat lipid in 3 months if he can tolerate the medicine. The Nexletol was denied by insurance since he was not on statin therapy.  Dr Lemmie Evens

## 2020-12-05 ENCOUNTER — Other Ambulatory Visit: Payer: Self-pay | Admitting: Nurse Practitioner

## 2020-12-05 DIAGNOSIS — E349 Endocrine disorder, unspecified: Secondary | ICD-10-CM

## 2020-12-23 ENCOUNTER — Telehealth: Payer: Self-pay

## 2020-12-23 ENCOUNTER — Other Ambulatory Visit: Payer: Self-pay | Admitting: Nurse Practitioner

## 2020-12-23 DIAGNOSIS — E349 Endocrine disorder, unspecified: Secondary | ICD-10-CM

## 2020-12-23 MED ORDER — TESTOSTERONE 1.62 % TD GEL: TRANSDERMAL | 0 refills | Status: DC

## 2020-12-23 NOTE — Telephone Encounter (Signed)
Patient called and wants his testosterone refilled. (6 bottles)  [id number] [name]

## 2020-12-25 ENCOUNTER — Encounter: Payer: Self-pay | Admitting: Nurse Practitioner

## 2020-12-25 DIAGNOSIS — E291 Testicular hypofunction: Secondary | ICD-10-CM | POA: Insufficient documentation

## 2020-12-30 ENCOUNTER — Other Ambulatory Visit: Payer: Self-pay | Admitting: Nurse Practitioner

## 2020-12-30 DIAGNOSIS — E349 Endocrine disorder, unspecified: Secondary | ICD-10-CM

## 2020-12-30 MED ORDER — TESTOSTERONE 1.62 % TD GEL: TRANSDERMAL | 0 refills | Status: DC

## 2021-01-01 ENCOUNTER — Telehealth: Payer: Self-pay

## 2021-01-01 NOTE — Telephone Encounter (Signed)
Patients prior auth for Testosterone Gel was approved through 12/25/21

## 2021-01-02 ENCOUNTER — Ambulatory Visit: Payer: Commercial Managed Care - PPO | Admitting: Nurse Practitioner

## 2021-01-02 ENCOUNTER — Encounter: Payer: Self-pay | Admitting: Nurse Practitioner

## 2021-01-02 ENCOUNTER — Other Ambulatory Visit: Payer: Self-pay

## 2021-01-02 VITALS — BP 117/80 | HR 88 | Temp 99.0°F | Wt 220.0 lb

## 2021-01-02 DIAGNOSIS — U071 COVID-19: Secondary | ICD-10-CM | POA: Diagnosis not present

## 2021-01-02 MED ORDER — IPRATROPIUM BROMIDE HFA 17 MCG/ACT IN AERS: 2.0000 | INHALATION_SPRAY | Freq: Four times a day (QID) | RESPIRATORY_TRACT | 2 refills | Status: DC

## 2021-01-02 MED ORDER — IPRATROPIUM BROMIDE 0.03 % NA SOLN: 2.0000 | Freq: Three times a day (TID) | NASAL | 2 refills | Status: DC

## 2021-01-02 MED ORDER — PROMETHAZINE-DM 6.25-15 MG/5ML PO SYRP: 5.0000 mL | ORAL_SOLUTION | Freq: Four times a day (QID) | ORAL | 1 refills | Status: DC | PRN

## 2021-01-02 MED ORDER — DEXAMETHASONE 1 MG PO TABS: ORAL_TABLET | ORAL | 0 refills | Status: DC

## 2021-01-02 NOTE — Progress Notes (Signed)
THIS ENCOUNTER IS A VIRTUAL VISIT DUE TO COVID-19 - PATIENT WAS NOT SEEN IN THE OFFICE.  PATIENT HAS CONSENTED TO VIRTUAL VISIT / TELEMEDICINE VISIT   Virtual Visit via telephone Note  I connected with  Michael Kim on 01/02/2021 by telephone.  I verified that I am speaking with the correct person using two identifiers.    I discussed the limitations of evaluation and management by telemedicine and the availability of in person appointments. The patient expressed understanding and agreed to proceed.  History of Present Illness:  BP 117/80    Pulse 88    Temp 99 F (37.2 C)    Wt 220 lb (99.8 kg)    BMI 29.84 kg/m  50 y.o. patient contacted office reporting URI sx . he tested positive by home test today. OV was conducted by telephone to minimize exposure. This patient was vaccinated for covid 19, 2021   Sx began 1 day ago with weakness/fatigue, chills, headache, cough with clear mucus. He did have some shortness of breath yesterday but is doing well now  Treatments tried so far: Advil  Exposures: 3 days ago someone at work tested positive   Medications  Current Outpatient Medications (Endocrine & Metabolic):    Testosterone 1.62 % GEL, APPLY TWO PUMPS EXTERNALLY ON EACH ARM ONCE DAILY Strength: 1.62 %  Current Outpatient Medications (Cardiovascular):    atorvastatin (LIPITOR) 40 MG tablet, Take 1 tablet (40 mg total) by mouth daily.   tadalafil (CIALIS) 20 MG tablet, Take 1/2 to 1 tablet every 2 to 3 days as needed for [name]   ezetimibe (ZETIA) 10 MG tablet, Take  1 tablet  Daily  for Cholesterol (Patient not taking: Reported on 10/23/2020)   Current Outpatient Medications (Analgesics):    ibuprofen (ADVIL) 400 MG tablet, Take 400 mg by mouth every 6 (six) hours as needed.   Current Outpatient Medications (Other):    Cholecalciferol (VITAMIN D) 125 MCG (5000 UT) CAPS, Take 1 capsule by mouth 2 (two) times daily.   cyclobenzaprine (FLEXERIL) 10 MG tablet, Take 1 tablet (10 mg  total) by mouth 3 (three) times daily as needed for muscle spasms.   Multiple Vitamin (MULTIVITAMIN ADULT PO), Take by mouth.   Psyllium (METAMUCIL FIBER PO), Take 1 Dose by mouth daily.  Allergies:  Allergies  Allergen Reactions   Pravachol [Pravastatin] Other (See Comments)    Fatigue   Rosuvastatin Other (See Comments)    Felt strange    Problem list He has Rectal bleeding; Vitamin D deficiency; Testosterone deficiency; Mixed hyperlipidemia; Atrial fibrillation with rapid ventricular response ([place]); Hypertension; Medication management; Family history of colon cancer; OSA on CPAP; FHx: heart disease; Statin intolerance; and Hypogonadism in male on their problem list.   Social History:   reports that he has been smoking cigars. He has never used smokeless tobacco. He reports current alcohol use of about 21.0 standard drinks per week. He reports that he does not use drugs.  Observations/Objective:  General : Well sounding patient in no apparent distress HEENT: no hoarseness, no cough for duration of visit Lungs: speaks in complete sentences, no audible wheezing, no apparent distress Neurological: alert, oriented x 3 Psychiatric: pleasant, judgement appropriate   Assessment and Plan:  COVID -     dexamethasone (DECADRON) 1 MG tablet; Take 3 tabs for 3 days, 2 tabs for 3 days 1 tab for 5 days. Take with food. -     promethazine-dextromethorphan (PROMETHAZINE-DM) 6.25-15 MG/5ML syrup; Take 5 mLs by mouth 4 (four)  times daily as needed for cough. -     ipratropium (ATROVENT) 0.03 % nasal spray; Place 2 sprays into the nose 3 (three) times daily. -     ipratropium (ATROVENT HFA) 17 MCG/ACT inhaler; Inhale 2 puffs into the lungs 4 (four) times daily.    Covid 19 positive per rapid screening test in home test Risk factors include: Atrial fibrillation, hypertension, OSA with CPAP, obesity Symptoms are: mild Immue support reviewed  Take tylenol PRN temp 101+ Push hydration Regular  ambulation or calf exercises exercises for clot prevention and 81 mg ASA unless contraindicated Sx supportive therapy suggested Follow up via mychart or telephone if needed Advised patient obtain O2 monitor; present to ED if persistently <90% or with severe dyspnea, CP, fever uncontrolled by tylenol, confusion, sudden decline Should remain in isolation until at least 5 days from onset of sx, 24-48 hours fever free without tylenol, sx such as cough are improved.      Follow Up Instructions:  I discussed the assessment and treatment plan with the patient. The patient was provided an opportunity to ask questions and all were answered. The patient agreed with the plan and demonstrated an understanding of the instructions.   The patient was advised to call back or seek an in-person evaluation if the symptoms worsen or if the condition fails to improve as anticipated.  I provided 20 minutes of non-face-to-face time during this encounter.   Magda Bernheim, NP

## 2021-02-12 ENCOUNTER — Encounter: Payer: Self-pay | Admitting: Cardiology

## 2021-02-12 DIAGNOSIS — G4733 Obstructive sleep apnea (adult) (pediatric): Secondary | ICD-10-CM

## 2021-04-06 ENCOUNTER — Telehealth: Payer: Self-pay | Admitting: Cardiovascular Disease

## 2021-04-06 ENCOUNTER — Ambulatory Visit (INDEPENDENT_AMBULATORY_CARE_PROVIDER_SITE_OTHER): Payer: Commercial Managed Care - PPO | Admitting: Nurse Practitioner

## 2021-04-06 VITALS — BP 118/72 | HR 77 | Temp 96.8°F | Wt 227.6 lb

## 2021-04-06 DIAGNOSIS — R5383 Other fatigue: Secondary | ICD-10-CM | POA: Diagnosis not present

## 2021-04-06 DIAGNOSIS — I4819 Other persistent atrial fibrillation: Secondary | ICD-10-CM | POA: Diagnosis not present

## 2021-04-06 DIAGNOSIS — R002 Palpitations: Secondary | ICD-10-CM

## 2021-04-06 MED ORDER — DILTIAZEM HCL ER COATED BEADS 240 MG PO CP24: 240.0000 mg | ORAL_CAPSULE | Freq: Every day | ORAL | 0 refills | Status: DC

## 2021-04-06 MED ORDER — APIXABAN 5 MG PO TABS: 5.0000 mg | ORAL_TABLET | Freq: Two times a day (BID) | ORAL | 2 refills | Status: DC

## 2021-04-06 NOTE — Progress Notes (Signed)
Assessment and Plan: ? ?Mr. Kassabian was seen today for a new problem. ? ?Diagnoses and all order for this visit: ? ?1. Persistent atrial fibrillation (HCC)/Palpitations/Other Fatigue ? ?Sample Eliquis 5 mg (2 week supply) provided in office. ?Instructed to start taking today. ?Start Diltiazem 240 mg QD. ?Follow up with Cardiology  ? ?- CBC with Differential/Platelet ?- COMPLETE METABOLIC PANEL WITH GFR ?- Magnesium ?- Lipid panel ?- TSH ?- EKG ? ?Report to ER for any increase in SOB, CP, productive pink frothy sputum, stroke like symptoms including difficulty speaking, walking, moving, accompanied by pain, numbness tingling, N/V.  ? ?Further disposition pending results of labs. Discussed med's effects and SE's.   ?Over 30 minutes of exam, counseling, chart review, and critical decision making was performed.  ? ?Future Appointments  ?Date Time Provider [place]  ?10/26/2021  2:00 PM Mull, Townsend Roger, NP [facility] None  ? ? ?------------------------------------------------------------------------------------------------------------------ ? ? ?HPI ?BP 118/72   Pulse 77   Temp (!) 96.8 ?F (36 ?C)   Wt 227 lb 9.6 oz (103.2 kg)   SpO2 98%   BMI 30.87 kg/m?  ? ?51 y.o.male presents with complaints of heart fluttering accompanied by DOE, fatigue, intermittent dizziness.  Onset this AM around 0615, although he states feeling "off" over the last month.  Reports jumping from bed and immediately feeling the fluttering in his chest.  Last time episode occurred was 5 years ago when he jumped into a pool.  Reports having an additional episode several years prior.  Both past episodes spontaneously resolved.  Initially he followed with Cardiology and was provided Diltiazem and reports never taking.  He has not followed with Cardiology in over two years.  He had Propranolol on hand and took a dose this morning.   He does not remember the dosage.  His current medications have included Decadron and Advil, prescribed for PRN  arthritic pain, but he has not recently taken these medications.  He also uses Testosterone Gel.  He reports taking ASA 81 mg but this is not listed on his medications. He has not worn a cardiac Holter monitor, he has not had additional work up to include TTE, ST.  Reports hx of sleep apnea and uses a CPAP machine.  Has recently had an updated consultation with [facility].  Denies recent PNA/URI, recreational drug use, OTC supplements. Denies hx of thyroid disease, PE.  Denies N/V, CP, diaphoresis, pain that radiates. He is not a smoker. Father has hx of afib and is currently controlled on anti-coagulation medication.  Had Covid booster last year.   ? ?Past Medical History:  ?Diagnosis Date  ? Atrial fibrillation with rapid ventricular response ([place])   ? GERD (gastroesophageal reflux disease)   ? Hyperlipidemia   ? Other testicular hypofunction   ? Unspecified vitamin D deficiency   ?  ? ?Allergies  ?Allergen Reactions  ? Pravachol [Pravastatin] Other (See Comments)  ?  Fatigue  ? Rosuvastatin Other (See Comments)  ?  Felt strange  ? ? ?Current Outpatient Medications on File Prior to Visit  ?Medication Sig  ? atorvastatin (LIPITOR) 40 MG tablet Take 1 tablet (40 mg total) by mouth daily.  ? Cholecalciferol (VITAMIN D) 125 MCG (5000 UT) CAPS Take 1 capsule by mouth 2 (two) times daily.  ? cyclobenzaprine (FLEXERIL) 10 MG tablet Take 1 tablet (10 mg total) by mouth 3 (three) times daily as needed for muscle spasms.  ? dexamethasone (DECADRON) 1 MG tablet Take 3 tabs for 3 days, 2 tabs  for 3 days 1 tab for 5 days. Take with food.  ? ipratropium (ATROVENT HFA) 17 MCG/ACT inhaler Inhale 2 puffs into the lungs 4 (four) times daily.  ? ipratropium (ATROVENT) 0.03 % nasal spray Place 2 sprays into the nose 3 (three) times daily.  ? Multiple Vitamin (MULTIVITAMIN ADULT PO) Take by mouth.  ? promethazine-dextromethorphan (PROMETHAZINE-DM) 6.25-15 MG/5ML syrup Take 5 mLs by mouth 4 (four) times daily as needed for cough.  ?  Psyllium (METAMUCIL FIBER PO) Take 1 Dose by mouth daily.  ? tadalafil (CIALIS) 20 MG tablet Take 1/2 to 1 tablet every 2 to 3 days as needed for [name]  ? Testosterone 1.62 % GEL APPLY TWO PUMPS EXTERNALLY ON EACH ARM ONCE DAILY Strength: 1.62 %  ? ?No current facility-administered medications on file prior to visit.  ? ? ?ROS: all negative except what is noted in the HPI.  ? ?Physical Exam: ? ?BP 118/72   Pulse 77   Temp (!) 96.8 ?F (36 ?C)   Wt 227 lb 9.6 oz (103.2 kg)   SpO2 98%   BMI 30.87 kg/m?  ? ?General Appearance: NAD.  Awake, conversant and cooperative. ?Eyes: PERRLA, EOMs intact.  Sclera white.  Conjunctiva without erythema. ?Sinuses: No frontal/maxillary tenderness.  No nasal discharge. Nares patent.  ?ENT/Mouth: Ext aud canals clear.  Bilateral TMs w/DOL and without erythema or bulging. Hearing intact.  Posterior pharynx without swelling or exudate.  Tonsils without swelling or erythema.  ?Neck: Supple.  No masses, nodules or thyromegaly. ?Respiratory: Effort is regular with non-labored breathing. Breath sounds are equal bilaterally without rales, rhonchi, wheezing or stridor.  ?Cardio: Irregularly irregular rhythm.  Brisk peripheral pulses without edema.  ?Abdomen: Active BS in all four quadrants.  Soft and non-tender without guarding, rebound tenderness, hernias or masses. ?Lymphatics: Non tender without lymphadenopathy.  ?Musculoskeletal: Full ROM, 5/5 strength, normal ambulation.  No clubbing or cyanosis. ?Skin: Appropriate color for ethnicity. Warm without rashes, lesions, ecchymosis, ulcers.  ?Neuro: CN II-XII grossly normal. Normal muscle tone without cerebellar symptoms and intact sensation.   ?Psych: AO X 3,  appropriate mood and affect, insight and judgment.  ?  ? ?Darrol Jump, NP ?12:47 PM ?[facility] ? ?

## 2021-04-06 NOTE — Telephone Encounter (Signed)
Pt called in to report was noted to be in Afib at PCP office visit today.  128/90-78 at [facility].  Pt reports highest HR 110. PCP would like for pt to be seen by Cardiology.  Pt is having SOB and feels like can not think clearly.   Started on Eliquis 5 mg PO BID and diltiazem 240 mg  PO QD by PCP.  Pt given Eliquis samples by PCP office.  Advised pt to take diltiazem as soon as picked up from pharmacy.   Will route to Dr. Acie Fredrickson to advise.   ?

## 2021-04-06 NOTE — Telephone Encounter (Signed)
Patient c/o Palpitations:  High priority if patient c/o lightheadedness, shortness of breath, or chest pain ? ?How long have you had palpitations/irregular HR/ Afib? Are you having the symptoms now? Yes  ? ?Are you currently experiencing lightheadedness, SOB or CP? yes ? ?Do you have a history of afib (atrial fibrillation) or irregular heart rhythm? yes ? ?Have you checked your BP or HR? (document readings if available):  ? ?Are you experiencing any other symptoms? Patient saw his PCP and was told to follow up with his Cardiologist  ?

## 2021-04-07 ENCOUNTER — Other Ambulatory Visit: Payer: Self-pay

## 2021-04-07 DIAGNOSIS — G4733 Obstructive sleep apnea (adult) (pediatric): Secondary | ICD-10-CM

## 2021-04-07 LAB — CBC WITH DIFFERENTIAL/PLATELET
Absolute Monocytes: 651 cells/uL (ref 200–950)
Basophils Absolute: 49 cells/uL (ref 0–200)
Basophils Relative: 0.7 %
Eosinophils Absolute: 140 cells/uL (ref 15–500)
Eosinophils Relative: 2 %
HCT: 48.5 % (ref 38.5–50.0)
Hemoglobin: 16.5 g/dL (ref 13.2–17.1)
Lymphs Abs: 1953 cells/uL (ref 850–3900)
MCH: 32.5 pg (ref 27.0–33.0)
MCHC: 34 g/dL (ref 32.0–36.0)
MCV: 95.7 fL (ref 80.0–100.0)
MPV: 11.3 fL (ref 7.5–12.5)
Monocytes Relative: 9.3 %
Neutro Abs: 4207 cells/uL (ref 1500–7800)
Neutrophils Relative %: 60.1 %
Platelets: 164 10*3/uL (ref 140–400)
RBC: 5.07 10*6/uL (ref 4.20–5.80)
RDW: 12.5 % (ref 11.0–15.0)
Total Lymphocyte: 27.9 %
WBC: 7 10*3/uL (ref 3.8–10.8)

## 2021-04-07 LAB — COMPLETE METABOLIC PANEL WITH GFR
AG Ratio: 1.8 (calc) (ref 1.0–2.5)
ALT: 36 U/L (ref 9–46)
AST: 27 U/L (ref 10–40)
Albumin: 4.4 g/dL (ref 3.6–5.1)
Alkaline phosphatase (APISO): 59 U/L (ref 36–130)
BUN: 18 mg/dL (ref 7–25)
CO2: 26 mmol/L (ref 20–32)
Calcium: 9.8 mg/dL (ref 8.6–10.3)
Chloride: 106 mmol/L (ref 98–110)
Creat: 0.82 mg/dL (ref 0.60–1.29)
Globulin: 2.5 g/dL (calc) (ref 1.9–3.7)
Glucose, Bld: 101 mg/dL — ABNORMAL HIGH (ref 65–99)
Potassium: 4.5 mmol/L (ref 3.5–5.3)
Sodium: 140 mmol/L (ref 135–146)
Total Bilirubin: 0.8 mg/dL (ref 0.2–1.2)
Total Protein: 6.9 g/dL (ref 6.1–8.1)
eGFR: 108 mL/min/{1.73_m2} (ref >=60)

## 2021-04-07 LAB — LIPID PANEL
Cholesterol: 171 mg/dL (ref <200)
HDL: 92 mg/dL (ref >=40)
LDL Cholesterol (Calc): 54 mg/dL (calc)
Non-HDL Cholesterol (Calc): 79 mg/dL (calc) (ref <130)
Total CHOL/HDL Ratio: 1.9 (calc) (ref <5.0)
Triglycerides: 173 mg/dL — ABNORMAL HIGH (ref <150)

## 2021-04-07 LAB — TSH: TSH: 0.73 mIU/L (ref 0.40–4.50)

## 2021-04-07 LAB — MAGNESIUM: Magnesium: 2 mg/dL (ref 1.5–2.5)

## 2021-04-07 NOTE — Telephone Encounter (Signed)
Patient is following up, requesting to go over instructions for taking Eliquis. He would also like to know if there are any restrictions or limitations he needs to be aware of. Is moderate exercise alright? Please advise. ?

## 2021-04-07 NOTE — Telephone Encounter (Signed)
Returned call to Pt. ? ?Pt scheduled with afib clinic on April 10 at 9:00 am. ? ?Pt had not started Eliquis because he was waiting to talk to his cardiologist. ? ?Advised to start Eliquis and diltiazem.  Provided some basic afib education.   ? ?Pt agreeable to appt with afib clinic and will start medications today. ?

## 2021-04-07 NOTE — Telephone Encounter (Signed)
I spoke with the pt and advised him that he will get a lot of information at his OV 04/13/21 but I advised him to take the Eliquis regularly BID with no missed doses... and to avoid any new meds for nos until he talks with the AFIB clinic... he will decrease ETOH use for now even though he does not drink much... he will limit greens and avoid grapefruit, NSAID use..  ? ?He will call back if he has any further questions otherwise will keep his 04/13/21 appt.  ?

## 2021-04-08 ENCOUNTER — Encounter: Payer: Self-pay | Admitting: Nurse Practitioner

## 2021-04-09 ENCOUNTER — Other Ambulatory Visit: Payer: Self-pay | Admitting: Nurse Practitioner

## 2021-04-09 DIAGNOSIS — I480 Paroxysmal atrial fibrillation: Secondary | ICD-10-CM

## 2021-04-09 MED ORDER — RIVAROXABAN 15 MG PO TABS: 15.0000 mg | ORAL_TABLET | Freq: Every day | ORAL | 2 refills | Status: DC

## 2021-04-09 NOTE — Telephone Encounter (Signed)
-----   Message from Antonieta Iba, RN sent at 04/07/2021  5:16 PM EDT ----- ?Regarding: Itamar Study ?Good Afternoon! ?An [name] study was ordered on this patient who was not in the office. Would you be able to reach out to him and set up a time for him to come by and get set up? ?Thanks so much! ?Carly  ? ?

## 2021-04-10 ENCOUNTER — Telehealth: Payer: Self-pay | Admitting: *Deleted

## 2021-04-10 NOTE — Telephone Encounter (Signed)
You routed conversation to You Just now (6:00 PM)  ? ?You Just now (6:00 PM)  ? ?----- Message from Antonieta Iba, RN sent at 04/07/2021  5:16 PM EDT ----- ?Regarding: Itamar Study ?Good Afternoon! ?An [name] study was ordered on this patient who was not in the office. Would you be able to reach out to him and set up a time for him to come by and get set up? ?Thanks so much! ?Carly  ?  ?   ?  ?  ?Note   ? ?You  Michael Kim Yesterday (9:39 AM)  ? ?Good morning [name],  ?  ?My name is Arbie Cookey. Dr. Theodosia Blender nurse Carly sent me a message that you will need to be set up with a home sleep study (Itamar). Let me know what day is good for you to stop by the office for the set up. It will take no more than 15 minutes. We will add an application onto your phone, which will need bluetooth access. We will go over the tutorial how to do the sleep study. Let me know what day you want to come and what time will work for you.  ?  ?Thank you  ?Michael Kim/Itamar Sleep Study Coordinator  ? ?This MyChart message has not been read.  ? ?You Yesterday (9:35 AM)  ? ?----- Message from Antonieta Iba, RN sent at 04/07/2021  5:16 PM EDT ----- ?Regarding: Itamar Study ?Good Afternoon! ?An [name] study was ordered on this patient who was not in the office. Would you be able to reach out to him and set up a time for him to come by and get set up? ?Thanks so much! ?Carly  ?  ?   ?  ?  ?Note   ? ?Antonieta Iba, RN  Michael Locket [place] 3 days ago  ? ?Good Afternoon Michael Kim,  ?  ?Dr. Radford Pax received a note from Dr. Redmond Baseman that you will need an updated sleep study prior to your endoscopy for evaluation for the [facility] device. I have placed the order for the sleep study. The coordinator from our office is going to be reaching out to you to set up a time for you to come by the office to pick up the study. Let me know if you have any questions.  ?  ?Carly  ?

## 2021-04-10 NOTE — Telephone Encounter (Signed)
-----   Message from Antonieta Iba, RN sent at 04/07/2021  5:16 PM EDT ----- ?Regarding: Itamar Study ?Good Afternoon! ?An [name] study was ordered on this patient who was not in the office. Would you be able to reach out to him and set up a time for him to come by and get set up? ?Thanks so much! ?Carly  ? ?

## 2021-04-13 ENCOUNTER — Ambulatory Visit (HOSPITAL_COMMUNITY)
Admission: RE | Admit: 2021-04-13 | Discharge: 2021-04-13 | Disposition: A | Discharge status: Home / Self Care | Payer: Commercial Managed Care - PPO | Source: Ambulatory Visit | Attending: Physician Assistant | Admitting: Physician Assistant

## 2021-04-13 VITALS — BP 132/84 | HR 67 | Ht 72.0 in | Wt 225.2 lb

## 2021-04-13 DIAGNOSIS — G4733 Obstructive sleep apnea (adult) (pediatric): Secondary | ICD-10-CM | POA: Insufficient documentation

## 2021-04-13 DIAGNOSIS — E785 Hyperlipidemia, unspecified: Secondary | ICD-10-CM | POA: Diagnosis not present

## 2021-04-13 DIAGNOSIS — Z6830 Body mass index (BMI) 30.0-30.9, adult: Secondary | ICD-10-CM | POA: Diagnosis not present

## 2021-04-13 DIAGNOSIS — I480 Paroxysmal atrial fibrillation: Secondary | ICD-10-CM

## 2021-04-13 DIAGNOSIS — Z7901 Long term (current) use of anticoagulants: Secondary | ICD-10-CM | POA: Insufficient documentation

## 2021-04-13 DIAGNOSIS — E669 Obesity, unspecified: Secondary | ICD-10-CM | POA: Insufficient documentation

## 2021-04-13 MED ORDER — DILTIAZEM HCL 30 MG PO TABS: ORAL_TABLET | ORAL | 1 refills | Status: DC

## 2021-04-13 NOTE — Telephone Encounter (Signed)
I have s/w the pt and he is agreeable to plan of care for Itamar set up. Pt will come in 04/21/21 @ 4 pm. Pt aware to stop at the check in desk and let them know he is here to see Arbie Cookey for home sleep study set up. ? ?Sleep Apnea Evaluation ? ?[place] ? ?Today's Date: 04/13/2021  ? ?Patient Name: Michael Kim        ?DOB: 04-Jan-1971  ?     ?Height:        ?Weight:    ?BMI: There is no height or weight on file to calculate BMI.  ?  ?Referring Provider:   ?  ?STOP-BANG RISK ASSESSMENT    ?   ? ? ?If STOP-BANG Score ?3 OR two clinical symptoms - patient qualifies for WatchPAT (CPT 95800)     ? ?Sleep study ordered due to two (2) of the following clinical symptoms/diagnoses:  ?Excessive daytime sleepiness G47.10  Gastroesophageal reflux K21.9  Nocturia R35.1  Morning Headaches G44.221  Difficulty concentrating R41.840  Memory problems or poor judgment G31.84  Personality changes or irritability R45.4  Loud snoring R06.83  Depression F32.9  Unrefreshed by sleep G47.8  Impotence N52.9  History of high blood pressure R03.0  Insomnia G47.00  Sleep Disordered Breathing or Sleep Apnea ICD G47.33   ? ? ?

## 2021-04-13 NOTE — Progress Notes (Signed)
? ? ?Primary Care Physician: Unk Pinto, MD ?Primary Cardiologist: Dr Acie Fredrickson ?Primary Electrophysiologist: none ?Referring Physician: Darrol Jump NP ? ? ?Michael Kim is a 51 y.o. male with a history of OSA, HLD, atrial fibrillation who presents for consultation in the [facility].  The patient was initially diagnosed with atrial fibrillation at his PCP after presenting with symptoms of SOB, fatigue, and intermittent dizziness. This had been ongoing for 1-2 months. ECG showed afib and he was started on Eliquis for a CHADS2VASC score of 0 and diltiazem. He states he converted to SR after his first dose of diltiazem. He admits he has not been compliant with CPAP. He is undergoing workup for [facility]. He drinks about 4 beers daily.  ? ?Today, he denies symptoms of palpitations, chest pain, shortness of breath, orthopnea, PND, lower extremity edema, dizziness, presyncope, syncope, bleeding, or neurologic sequela. The patient is tolerating medications without difficulties and is otherwise without complaint today.  ? ? ?Atrial Fibrillation Risk Factors: ? ?he does have symptoms or diagnosis of sleep apnea. ?he is not compliant with CPAP therapy. ?he does not have a history of rheumatic fever. ?he does have a history of alcohol use. ?The patient does not have a history of early familial atrial fibrillation or other arrhythmias. ? ?he has a BMI of Body mass index is 30.54 kg/m?Marland KitchenMarland Kitchen ?Filed Weights  ? 04/13/21 0913  ?Weight: 102.2 kg  ? ? ?Family History  ?Problem Relation Age of Onset  ? Colon polyps Father   ? Hyperlipidemia Father   ? Lymphoma Mother   ? Cancer Mother   ?     lymphoma  ? Colon cancer Paternal Uncle 61  ? Depression Maternal Grandmother   ? Cancer Maternal Grandmother 67  ?     fatal colon  ? Heart disease Paternal Grandfather   ? Stomach cancer Neg Hx   ? ? ? ?Atrial Fibrillation Management history: ? ?Previous antiarrhythmic drugs: none ?Previous cardioversions:  none ?Previous ablations: none ?CHADS2VASC score: 0 ?Anticoagulation history: Eliquis ? ? ?Past Medical History:  ?Diagnosis Date  ? Atrial fibrillation with rapid ventricular response ([place])   ? GERD (gastroesophageal reflux disease)   ? Hyperlipidemia   ? Other testicular hypofunction   ? Unspecified vitamin D deficiency   ? ?Past Surgical History:  ?Procedure Laterality Date  ? COLONOSCOPY    ? SHOULDER SURGERY  2011  ? ? ?Current Outpatient Medications  ?Medication Sig Dispense Refill  ? apixaban (ELIQUIS) 5 MG TABS tablet Take 5 mg by mouth 2 (two) times daily.    ? atorvastatin (LIPITOR) 40 MG tablet Take 1 tablet (40 mg total) by mouth daily. 90 tablet 3  ? Cholecalciferol (VITAMIN D) 125 MCG (5000 UT) CAPS Take 1 capsule by mouth 2 (two) times daily.    ? cyclobenzaprine (FLEXERIL) 10 MG tablet Take 1 tablet (10 mg total) by mouth 3 (three) times daily as needed for muscle spasms. 30 tablet 0  ? diltiazem (CARTIA XT) 240 MG 24 hr capsule Take 1 capsule (240 mg total) by mouth daily. 30 capsule 0  ? Psyllium (METAMUCIL FIBER PO) Take 1 Dose by mouth daily.    ? tadalafil (CIALIS) 20 MG tablet Take 1/2 to 1 tablet every 2 to 3 days as needed for [name] 30 tablet 0  ? Testosterone 1.62 % GEL APPLY TWO PUMPS EXTERNALLY ON EACH ARM ONCE DAILY Strength: 1.62 % 450 g 0  ? Rivaroxaban (XARELTO) 15 MG TABS tablet Take 1  tablet (15 mg total) by mouth daily. (Patient not taking: Reported on 04/13/2021) 30 tablet 2  ? ?No current facility-administered medications for this encounter.  ? ? ?Allergies  ?Allergen Reactions  ? Pravachol [Pravastatin] Other (See Comments)  ?  Fatigue  ? Rosuvastatin Other (See Comments)  ?  Felt strange  ? ? ?Social History  ? ?Socioeconomic History  ? Marital status: Significant Other  ?  Spouse name: Not on file  ? Number of children: Not on file  ? Years of education: Not on file  ? Highest education level: Not on file  ?Occupational History  ? Occupation: Golf Pro  ?  Employer: [employer]  ?  Comment: [employer]  ?Tobacco Use  ? Smoking status: Some Days  ?  Types: Cigars  ?  Last attempt to quit: 07/29/2006  ?  Years since quitting: 14.7  ? Smokeless tobacco: Never  ?Vaping Use  ? Vaping Use: Never used  ?Substance and Sexual Activity  ? Alcohol use: Yes  ?  Alcohol/week: 21.0 standard drinks  ?  Types: 21 Cans of beer per week  ?  Comment: 3-4 BEERS PER DAY PER PT  ? Drug use: No  ? Sexual activity: Yes  ?Other Topics Concern  ? Not on file  ?Social History Narrative  ? 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 ?Social Determinants of Health  ? ?Financial Resource Strain: Not on file  ?Food Insecurity: Not on file  ?Transportation Needs: Not on file  ?Physical Activity: Not on file  ?Stress: Not on file  ?Social Connections: Not on file  ?Intimate Partner Violence: Not on file  ? ? ? ?ROS- All systems are reviewed and negative except as per the HPI above. ? ?Physical Exam: ?Vitals:  ? 04/13/21 0913  ?BP: 132/84  ?Pulse: 67  ?Weight: 102.2 kg  ?Height: 6' (1.829 m)  ? ? ?GEN- The patient is a well appearing male, alert and oriented x 3 today.   ?Head- normocephalic, atraumatic ?Eyes-  Sclera clear, conjunctiva pink ?Ears- hearing intact ?Oropharynx- clear ?Neck- supple  ?Lungs- Clear to ausculation bilaterally, normal work of breathing ?Heart- Regular rate and rhythm, no murmurs, rubs or gallops  ?GI- soft, NT, ND, + BS ?Extremities- no clubbing, cyanosis, or edema ?MS- no significant deformity or atrophy ?Skin- no rash or lesion ?Psych- euthymic mood, full affect ?Neuro- strength and sensation are intact ? ?Wt Readings from Last 3 Encounters:  ?04/13/21 102.2 kg  ?04/06/21 103.2 kg  ?01/02/21 99.8 kg  ? ? ?EKG today demonstrates  ?SR ?Vent. rate 67 BPM ?PR interval 146 ms ?QRS duration 88 ms ?QT/QTcB 354/374 ms ? ?Echo 05/07/13 demonstrated  ?- Left ventricle: The cavity size was normal. Wall thickness  ?  was increased in a pattern of mild LVH. Systolic function  ?  was normal. The estimated  ejection fraction was in the  ?  range of 60% to 65%. Wall motion was normal; there were no regional wall motion abnormalities.  ?- Right atrium: The atrium was mildly dilated. ? ?Epic records are reviewed at length today ? ?CHA2DS2-VASc Score = 0  ?The patient's score is based upon: ?CHF History: 0 ?HTN History: 0 ?Diabetes History: 0 ?Stroke History: 0 ?Vascular Disease History: 0 ?Age Score: 0 ?Gender Score: 0 ?    ? ? ?ASSESSMENT AND PLAN: ?1. Paroxysmal Atrial Fibrillation (ICD10:  I48.0) ?The patient's CHA2DS2-VASc score is 0, indicating a 0.2% annual risk of stroke.   ?General  education about afib provided and questions answered. We also discussed his stroke risk and the risks and benefits of anticoagulation. ?Continue Eliquis 5 mg BID for one month post spontaneous conversion. Will discontinue at that point with low CV score.  ?Start diltiazem 30 mg q 4 hours PRN for palpitations. (Patient not taking 240 mg dose) ?Lifestyle modification discussed today including alcohol reduction/cessation.  ? ?2. Obesity ?Body mass index is 30.54 kg/m?. ?Lifestyle modification was discussed at length including regular exercise and weight reduction. ? ?3. Obstructive sleep apnea ?The importance of adequate treatment of sleep apnea was discussed today in order to improve our ability to maintain sinus rhythm long term. ?Patient has not been using CPAP. Encouraged compliance with CPAP therapy while workup ongoing for [facility].  ? ? ?Follow up in the AF clinic in 2 months.  ? ? ?Ricky Isabella Roemmich PA-C ?Afib Clinic ?[facility] ?[address] ?[place], [place] [address] ?[phone] ?04/13/2021 ?9:34 AM ? ?

## 2021-04-13 NOTE — Patient Instructions (Signed)
Cardizem '30mg'$  -- take 1 tablet every 4 hours AS NEEDED for heart rate >100  ? ?Continue eliquis for 3 more weeks then stop ?

## 2021-04-27 ENCOUNTER — Telehealth: Payer: Self-pay | Admitting: *Deleted

## 2021-04-27 NOTE — Telephone Encounter (Signed)
[facility] READ-APPROVED-No Auth Required ?

## 2021-04-27 NOTE — Telephone Encounter (Signed)
Ok to schedule -No Auth Required   ? ?Freada Bergeron, CMA 18 minutes ago (9:58 AM)  ? ?[facility] READ-APPROVED-No Auth Required  ?  ? ?

## 2021-04-30 ENCOUNTER — Telehealth: Payer: Self-pay | Admitting: Nurse Practitioner

## 2021-04-30 NOTE — Telephone Encounter (Signed)
Pt is needing a refill on his testosterone medicine to [place] on file due to it being cheaper there.  ?

## 2021-05-01 ENCOUNTER — Other Ambulatory Visit: Payer: Self-pay | Admitting: Nurse Practitioner

## 2021-05-01 DIAGNOSIS — E349 Endocrine disorder, unspecified: Secondary | ICD-10-CM

## 2021-05-01 MED ORDER — TESTOSTERONE 1.62 % TD GEL: TRANSDERMAL | 0 refills | Status: DC

## 2021-05-13 NOTE — Telephone Encounter (Signed)
Message sent to the pt today to let us know when he wants to come in for Itamar to set up.  ? ?SLEEP STUDY ?Received: Today ?Michael Kim, CMA  [place], Generic ?Good afternoon sir,  ? ?Just following up to see when you wanted to come by the office to set up the Home sleep study that Dr. Radford Pax wants to set up. Please either call the office [phone] and let the Home sleep dept or you can send a message through [facility] if you would like. I have you all approved at this moment, however the authorization is only good for a short time.  ? ?Thank you  ?[place] SLE[facility] DEPT  ?

## 2021-05-19 NOTE — Telephone Encounter (Signed)
I was able to reach the pt today by phone. Pt states he is going to need to hold off for now on sleep study as he is going through some family matters right now. Pt states both of his parents have dementia and live 500 miles away and he been doing a lot of back and forth traveling. I assured pt that I will update Carly, RN for Dr. Radford Pax that he is going to hold off on sleep study for now. Pt thanked me for being so understanding.   ?

## 2021-06-15 ENCOUNTER — Ambulatory Visit (HOSPITAL_COMMUNITY)
Admission: RE | Admit: 2021-06-15 | Discharge: 2021-06-15 | Disposition: A | Discharge status: Home / Self Care | Payer: Commercial Managed Care - PPO | Source: Ambulatory Visit | Attending: Physician Assistant | Admitting: Physician Assistant

## 2021-06-15 VITALS — BP 126/88 | HR 69 | Ht 72.0 in | Wt 228.2 lb

## 2021-06-15 DIAGNOSIS — Z6830 Body mass index (BMI) 30.0-30.9, adult: Secondary | ICD-10-CM | POA: Insufficient documentation

## 2021-06-15 DIAGNOSIS — E669 Obesity, unspecified: Secondary | ICD-10-CM | POA: Diagnosis not present

## 2021-06-15 DIAGNOSIS — E785 Hyperlipidemia, unspecified: Secondary | ICD-10-CM | POA: Diagnosis not present

## 2021-06-15 DIAGNOSIS — G4733 Obstructive sleep apnea (adult) (pediatric): Secondary | ICD-10-CM | POA: Insufficient documentation

## 2021-06-15 DIAGNOSIS — Z91199 Patient's noncompliance with other medical treatment and regimen due to unspecified reason: Secondary | ICD-10-CM | POA: Diagnosis not present

## 2021-06-15 DIAGNOSIS — Z79899 Other long term (current) drug therapy: Secondary | ICD-10-CM | POA: Insufficient documentation

## 2021-06-15 DIAGNOSIS — I480 Paroxysmal atrial fibrillation: Secondary | ICD-10-CM | POA: Diagnosis not present

## 2021-06-15 NOTE — Progress Notes (Signed)
Primary Care Physician: Unk Pinto, MD Primary Cardiologist: Dr Acie Fredrickson Primary Electrophysiologist: none Sleep: Dr Radford Pax Referring Physician: Darrol Jump NP   Michael Kim is a 51 y.o. male with a history of OSA, HLD, atrial fibrillation who presents for follow up in the [facility].  The patient was initially diagnosed with atrial fibrillation at his PCP after presenting with symptoms of SOB, fatigue, and intermittent dizziness. This had been ongoing for 1-2 months. ECG showed afib and he was started on Eliquis for a CHADS2VASC score of 0 and diltiazem. He states he converted to SR after his first dose of diltiazem. He admits he has not been compliant with CPAP. He is undergoing workup for Inspire. He drinks about 4 beers daily.   On follow up today, patient reports that he has done well since his last visit. He had not had any afib symptoms. He is off anticoagulation with low CV score. He has started using a mouthpiece for his OSA that he ordered online. He states that this has significantly decreased his snoring and he is sleeping better.   Today, he denies symptoms of palpitations, chest pain, shortness of breath, orthopnea, PND, lower extremity edema, dizziness, presyncope, syncope, bleeding, or neurologic sequela. The patient is tolerating medications without difficulties and is otherwise without complaint today.    Atrial Fibrillation Risk Factors:  he does have symptoms or diagnosis of sleep apnea. he is not compliant with CPAP therapy. he does not have a history of rheumatic fever. he does have a history of alcohol use. The patient does not have a history of early familial atrial fibrillation or other arrhythmias.  he has a BMI of Body mass index is 30.95 kg/m.Marland Kitchen Filed Weights   06/15/21 0904  Weight: 103.5 kg     Family History  Problem Relation Age of Onset   Colon polyps Father    Hyperlipidemia Father    Lymphoma Mother     Cancer Mother        lymphoma   Colon cancer Paternal Uncle 16   Depression Maternal Grandmother    Cancer Maternal Grandmother 47       fatal colon   Heart disease Paternal Grandfather    Stomach cancer Neg Hx      Atrial Fibrillation Management history:  Previous antiarrhythmic drugs: none Previous cardioversions: none Previous ablations: none CHADS2VASC score: 0 Anticoagulation history: Eliquis   Past Medical History:  Diagnosis Date   Atrial fibrillation with rapid ventricular response (HCC)    GERD (gastroesophageal reflux disease)    Hyperlipidemia    Other testicular hypofunction    Unspecified vitamin D deficiency    Past Surgical History:  Procedure Laterality Date   COLONOSCOPY     SHOULDER SURGERY  2011    Current Outpatient Medications  Medication Sig Dispense Refill   atorvastatin (LIPITOR) 40 MG tablet Take 1 tablet (40 mg total) by mouth daily. 90 tablet 3   Cholecalciferol (VITAMIN D) 125 MCG (5000 UT) CAPS Take 1 capsule by mouth 2 (two) times daily.     cyclobenzaprine (FLEXERIL) 10 MG tablet Take 1 tablet (10 mg total) by mouth 3 (three) times daily as needed for muscle spasms. 30 tablet 0   diltiazem (CARDIZEM) 30 MG tablet Take 1 tablet every 4 hours AS NEEDED for heart rate >100 30 tablet 1   Psyllium (METAMUCIL FIBER PO) Take 1 Dose by mouth daily.     tadalafil (CIALIS) 20 MG tablet Take 1/2 to  1 tablet every 2 to 3 days as needed for [name] 30 tablet 0   Testosterone 1.62 % GEL APPLY TWO PUMPS EXTERNALLY ON EACH ARM ONCE DAILY Strength: 1.62 % 450 g 0   apixaban (ELIQUIS) 5 MG TABS tablet Take 5 mg by mouth 2 (two) times daily. (Patient not taking: Reported on 06/15/2021)     No current facility-administered medications for this encounter.    Allergies  Allergen Reactions   Pravachol [Pravastatin] Other (See Comments)    Fatigue   Rosuvastatin Other (See Comments)    Felt strange    Social History   Socioeconomic History   Marital  status: Significant Other    Spouse name: Not on file   Number of children: Not on file   Years of education: Not on file   Highest education level: Not on file  Occupational History   Occupation: Golf Pro    Employer: [employer]    Comment: [place]  Tobacco Use   Smoking status: Some Days    Types: Cigars    Last attempt to quit: 07/29/2006    Years since quitting: 14.8   Smokeless tobacco: Never  Vaping Use   Vaping Use: Never used  Substance and Sexual Activity   Alcohol use: Yes    Alcohol/week: 21.0 standard drinks of alcohol    Types: 21 Cans of beer per week    Comment: 3-4 BEERS PER DAY PER PT   Drug use: No   Sexual activity: Yes  Other Topics Concern   Not on file  Social History Narrative   He is a golf pro at a Customer service [employer]   Social Determinants of Radio broadcast assistant Strain: Not on file  Food Insecurity: Not on file  Transportation Needs: Not on file  Physical Activity: Not on file  Stress: Not on file  Social Connections: Not on file  Intimate Partner Violence: Not on file     ROS- All systems are reviewed and negative except as per the HPI above.  Physical Exam: Vitals:   06/15/21 0904  BP: 126/88  Pulse: 69  Weight: 103.5 kg  Height: 6' (1.829 m)     GEN- The patient is a well appearing male, alert and oriented x 3 today.   HEENT-head normocephalic, atraumatic, sclera clear, conjunctiva pink, hearing intact, trachea midline. Lungs- Clear to ausculation bilaterally, normal work of breathing Heart- Regular rate and rhythm, no murmurs, rubs or gallops  GI- soft, NT, ND, + BS Extremities- no clubbing, cyanosis, or edema MS- no significant deformity or atrophy Skin- no rash or lesion Psych- euthymic mood, full affect Neuro- strength and sensation are intact   Wt Readings from Last 3 Encounters:  06/15/21 103.5 kg  04/13/21 102.2 kg  04/06/21 103.2 kg    EKG today demonstrates  SR Vent. rate 69 BPM PR interval 152  ms QRS duration 78 ms QT/QTcB 368/394 ms  Echo 05/07/13 demonstrated  - Left ventricle: The cavity size was normal. Wall thickness    was increased in a pattern of mild LVH. Systolic function    was normal. The estimated ejection fraction was in the    range of 60% to 65%. Wall motion was normal; there were no regional wall motion abnormalities.  - Right atrium: The atrium was mildly dilated.  Epic records are reviewed at length today  CHA2DS2-VASc Score = 0  The patient's score is based upon: CHF History: 0 HTN History: 0 Diabetes History: 0 Stroke History: 0  Vascular Disease History: 0 Age Score: 0 Gender Score: 0       ASSESSMENT AND PLAN: 1. Paroxysmal Atrial Fibrillation (ICD10:  I48.0) The patient's CHA2DS2-VASc score is 0, indicating a 0.2% annual risk of stroke.   Patient appears to be maintaining SR. Not currently on anticoagulation with low CV score.  Continue diltiazem 30 mg q 4 hours PRN for palpitations  2. Obesity Body mass index is 30.95 kg/m. Lifestyle modification was discussed and encouraged including regular physical activity and weight reduction.  3. Obstructive sleep apnea Intolerant of CPAP Patient using mouthpiece. He no longer wants to pursue Inspire device.  Encouraged him to follow up with Dr Radford Pax.    Follow up in the AF clinic in 6 months.    [facility] [address] [place], [place] [address] [phone] 06/15/2021 9:37 AM

## 2021-07-13 ENCOUNTER — Telehealth: Payer: Self-pay | Admitting: Nurse Practitioner

## 2021-07-13 ENCOUNTER — Other Ambulatory Visit: Payer: Self-pay | Admitting: Nurse Practitioner

## 2021-07-13 DIAGNOSIS — E349 Endocrine disorder, unspecified: Secondary | ICD-10-CM

## 2021-07-13 NOTE — Telephone Encounter (Signed)
Patient uses [facility] for generic testosterone. He is needing a refill but would like it sent to [facility] at [address] It is cheaper there. -e welch

## 2021-07-15 ENCOUNTER — Other Ambulatory Visit: Payer: Self-pay | Admitting: Nurse Practitioner

## 2021-07-15 DIAGNOSIS — E349 Endocrine disorder, unspecified: Secondary | ICD-10-CM

## 2021-07-15 MED ORDER — TESTOSTERONE 1.62 % TD GEL: TRANSDERMAL | 0 refills | Status: DC

## 2021-07-15 NOTE — Telephone Encounter (Signed)
Pt said he would like it sent to [facility] on [address]

## 2021-08-06 ENCOUNTER — Telehealth: Payer: Self-pay | Admitting: Nurse Practitioner

## 2021-08-06 ENCOUNTER — Other Ambulatory Visit: Payer: Self-pay | Admitting: Nurse Practitioner

## 2021-08-06 DIAGNOSIS — M545 Low back pain, unspecified: Secondary | ICD-10-CM

## 2021-08-06 MED ORDER — CYCLOBENZAPRINE HCL 10 MG PO TABS: 10.0000 mg | ORAL_TABLET | Freq: Three times a day (TID) | ORAL | 0 refills | Status: DC | PRN

## 2021-08-06 NOTE — Telephone Encounter (Signed)
Pt is requesting a refill on cyclobenzaprine, requesting it be sent to [facility] on [address]

## 2021-08-10 ENCOUNTER — Other Ambulatory Visit: Payer: Self-pay | Admitting: Nurse Practitioner

## 2021-08-10 ENCOUNTER — Telehealth: Payer: Self-pay | Admitting: Nurse Practitioner

## 2021-08-10 DIAGNOSIS — B009 Herpesviral infection, unspecified: Secondary | ICD-10-CM

## 2021-08-10 MED ORDER — VALACYCLOVIR HCL 500 MG PO TABS: 500.0000 mg | ORAL_TABLET | Freq: Two times a day (BID) | ORAL | 0 refills | Status: AC | PRN

## 2021-08-10 NOTE — Telephone Encounter (Signed)
Pt is requesting a refill on Vacyclovir for a cold sore that has popped up. Wanting it sent to [facility] [phone]

## 2021-08-16 ENCOUNTER — Encounter: Payer: Self-pay | Admitting: Cardiovascular Disease

## 2021-08-16 NOTE — Progress Notes (Signed)
Daimen Shovlin Coppola Date of Birth  November 21, 1970       [facility] [address] [place]    [place], [place]  [address]     [phone]       Fax  [phone]        Problem List: 1. Atrial fibrillation 2. Possible obstructive sleep apnea    History of Present Illness:  Dagmawi is a 51 yo with hx of PAF.  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  I met him at the hospital with rapid AF.  He converted spontaneously.  Has not had any significant arrhythmias.     Aug. 23, 2018:  Yash is seen back today after a several year absence. He was seen in the emergency room with rapid atrial fibrillation. He was started on Eliquis and Cardizem.   He converted to NSR .  He stopped the Eliquis and Diltiazem . Feels great.  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  Quit drinking caffiene.  Snores,  Does not know if he has apneic episodes.   March 27, 2019:  Lucca is seen for follow up of his PAF .  Now has been diagnosed with OSA. - had 35 apneic episodes / hour at night  Uses CPAP most of the time .     Occasional episodes of tachycardia.   Not irregular.   Takes propranolol with resolution in several minutes.    Has gained some weight .  Smokes cigars occasionally . Has quit caffiene.   Needs to work on weight loss.   Still eats lots of carbs.     Aug. 14, 2023 Kassius is seen for follow up of his PAF  Has OSA  Was using CPAP previously  Now using a mouth piece .  Still has good energy .   Getting some exercise,  10,000 steps a day . Walking, Still the head golf pro at [employer]   He has not been taking his Eliquis  CHADS2VASC is 0 Takes Eliquis and Diltiazem if he develops Afib  Had his last episode about a year ago when he wasn't using his CPAP  No episodes now that he is using the mouth guard.      Current Outpatient Medications on File Prior to Visit  Medication Sig Dispense Refill   atorvastatin (LIPITOR) 40 MG tablet Take 1 tablet (40 mg total) by mouth daily. 90 tablet  3   Cholecalciferol (VITAMIN D) 125 MCG (5000 UT) CAPS Take 1 capsule by mouth 2 (two) times daily.     cyclobenzaprine (FLEXERIL) 10 MG tablet Take 1 tablet (10 mg total) by mouth 3 (three) times daily as needed for muscle spasms. 30 tablet 0   diltiazem (CARDIZEM) 30 MG tablet Take 1 tablet every 4 hours AS NEEDED for heart rate >100 30 tablet 1   Psyllium (METAMUCIL FIBER PO) Take 1 Dose by mouth daily.     tadalafil (CIALIS) 20 MG tablet Take 1/2 to 1 tablet every 2 to 3 days as needed for [name] 30 tablet 0   Testosterone 1.62 % GEL APPLY TWO PUMPS EXTERNALLY ON EACH ARM ONCE DAILY Strength: 1.62 % 450 g 0   valACYclovir (VALTREX) 500 MG tablet Take 1 tablet (500 mg total) by mouth 2 (two) times daily as needed. 30 tablet 0   apixaban (ELIQUIS) 5 MG TABS tablet Take 5 mg by mouth 2 (two) times daily. (Patient not taking: Reported on 06/15/2021)  No current facility-administered medications on file prior to visit.    Allergies  Allergen Reactions   Pravachol [Pravastatin] Other (See Comments)    Fatigue   Rosuvastatin Other (See Comments)    Felt strange    Past Medical History:  Diagnosis Date   Atrial fibrillation with rapid ventricular response (HCC)    GERD (gastroesophageal reflux disease)    Hyperlipidemia    Other testicular hypofunction    Unspecified vitamin D deficiency     Past Surgical History:  Procedure Laterality Date   COLONOSCOPY     SHOULDER SURGERY  2011    Social History   Tobacco Use  Smoking Status Some Days   Types: Cigars   Last attempt to quit: 07/29/2006   Years since quitting: 15.0  Smokeless Tobacco Never    Social History   Substance and Sexual Activity  Alcohol Use Yes   Alcohol/week: 21.0 standard drinks of alcohol   Types: 21 Cans of beer per week   Comment: 3-4 BEERS PER DAY PER PT    Family History  Problem Relation Age of Onset   Colon polyps Father    Hyperlipidemia Father    Lymphoma Mother    Cancer Mother         lymphoma   Colon cancer Paternal Uncle 69   Depression Maternal Grandmother    Cancer Maternal Grandmother 47       fatal colon   Heart disease Paternal Grandfather    Stomach cancer Neg Hx     Reviw of Systems:  Reviewed in the HPI.  All other systems are negative.  Physical Exam: Blood pressure 122/82, pulse 72, height 6' (1.829 m), weight 228 lb 12.8 oz (103.8 kg), SpO2 97 %.  GEN:  Well nourished, well developed in no acute distress HEENT: Normal NECK: No JVD; No carotid bruits LYMPHATICS: No lymphadenopathy CARDIAC: RRR , no murmurs, rubs, gallops RESPIRATORY:  Clear to auscultation without rales, wheezing or rhonchi  ABDOMEN: Soft, non-tender, non-distended MUSCULOSKELETAL:  No edema; No deformity  SKIN: Warm and dry NEUROLOGIC:  Alert and oriented x 3     Assessment / Plan:  1. Paroxysmal atrial fibrillation:    Ruston has a history of paroxysmal atrial fibrillation.  These episodes seem to be related to poorly controlled sleep apnea.  He had a CPAP but he would take it off inadvertently through the night.  He is now using a mouthguard and having great success with that.  He has not had any episodes of A-fib since using the nighttime mouthpiece. At this time I do not think he needs to restart the Eliquis.  He takes Eliquis and diltiazem if he develops an episode of A-fib.  I think that is a fine strategy at this time since his CHADS2VASC score is 0.  I have advised some weight loss.  Continue to be active.  We will see him again in 1 year.    Mertie Moores, MD  08/17/2021 9:23 AM    [address],  [place], [place]  [address] Phone: [phone]; Fax: [phone]

## 2021-08-17 ENCOUNTER — Encounter: Payer: Self-pay | Admitting: Cardiovascular Disease

## 2021-08-17 ENCOUNTER — Ambulatory Visit: Payer: Commercial Managed Care - PPO | Admitting: Cardiovascular Disease

## 2021-08-17 VITALS — BP 122/82 | HR 72 | Ht 72.0 in | Wt 228.8 lb

## 2021-08-17 DIAGNOSIS — I480 Paroxysmal atrial fibrillation: Secondary | ICD-10-CM | POA: Diagnosis not present

## 2021-08-17 NOTE — Patient Instructions (Signed)

## 2021-10-10 ENCOUNTER — Other Ambulatory Visit: Payer: Self-pay | Admitting: Nurse Practitioner

## 2021-10-10 DIAGNOSIS — E349 Endocrine disorder, unspecified: Secondary | ICD-10-CM

## 2021-10-23 NOTE — Progress Notes (Unsigned)
COMPLETE PHYSICAL   Assessment and Plan:   Albeiro was seen today for annual exam.  Diagnoses and all orders for this visit:  Encounter for general adult medical examination with abnormal findings Yearly -     CBC with Differential/Platelet -     COMPLETE METABOLIC PANEL WITH GFR  Hypertension, unspecified type Controlled today Monitor blood pressure at home; call if consistently over 130/80 Continue DASH diet.   Reminder to go to the ER if any CP, SOB, nausea, dizziness, severe HA, changes vision/speech, left arm numbness and tingling and jaw pain. -     Magnesium  Mixed hyperlipidemia Coronary calcium score 0, no current medication Discussed dietary and exercise modifications Low fat diet -     Lipid panel  Vitamin D deficiency Continue supplementation to maintain goal of 70-100 Taking Vitamin D 1,000 IU daily -     VITAMIN D 25 Hydroxy (Vit-D Deficiency, Fractures)  Paroxysmal Atrial Fibrillation(HCC) Continue CPAP, low dose aspirin daily Avoid ETHO, caffeine Contiue follow u with cardiology -     EKG 12-Lead  OSA on CPAP Continue CPAP/BiPAP, has not been using regularly Helping with daytime fatigue Weight loss still advised Discussed mask & tubing hygeine  Testosterone deficiency -     Testosterone (ANDROGEL PUMP) 20.25 MG/ACT (1.62%) GEL; 2 pumps daily on each arm (4 pumps total) daily -     tadalafil (CIALIS) 20 MG tablet; Take 1/2 to 1 tablet every 2 to 3 days as needed for [name]  Family history of colon cancer UTD next 2023  Medication management Continued  BMI 29.0-29.9,adult Discussed dietary and exercise modifications  Screening for ischemic heart disease -     EKG  Screening for blood or protein in urine -     Urinalysis w microscopic + reflex cultur -     Microalbumin / creatinine urine ratio  Influenza vaccination declined Discussed with patient   Further disposition pending results if labs check today. Discussed med's effects and SE's.    Over 30 minutes of face to face interview, exam, counseling, chart review, and critical decision making was performed.    Future Appointments  Date Time Provider [place]  10/26/2021  2:00 PM Alycia Rossetti, NP [facility] None  10/27/2022  2:00 PM Alycia Rossetti, NP [facility] None    HPI 51 y.o. male  presents for complete physical and follow up on HTN, HLD,  Prediabetes, OSA on CPAP, testosterone deficency and vitamin D.  He continues to teach golf.    His blood pressure has been controlled at home, today their BP is  .  BP Readings from Last 3 Encounters:  08/17/21 122/82  06/15/21 126/88  04/13/21 132/84     He does workout. He denies chest pain, shortness of breath, dizziness. He has history of Afib, folllows cardio CHADSVAC 1, on bASA. He is on CPAP,  helps with his sleep and energy.    He is on cholesterol medication, zetia and denies myalgias. His cholesterol is not at goal. The cholesterol last visit was:  Did very well on Nexlizet but insurance does not cover now. Cardiac calcium score  is zero 07/30/20. Lab Results  Component Value Date   CHOL 171 04/06/2021   HDL 92 04/06/2021   LDLCALC 54 04/06/2021   TRIG 173 (H) 04/06/2021   CHOLHDL 1.9 04/06/2021    He has been working on diet and exercise for prediabetes, and denies foot ulcerations, hyperglycemia, hypoglycemia , increased appetite, nausea, paresthesia of the feet, polydipsia,  polyuria, visual disturbances, vomiting and weight loss. Last A1C in the office was:  Lab Results  Component Value Date   HGBA1C 5.1 10/23/2020   Patient is on Vitamin D supplement for deficency.  Last check was not to goal. Lab Results  Component Value Date   VD25OH 60 10/23/2020     BMI is There is no height or weight on file to calculate BMI., he is working on diet and exercise.Lots of activity with his job has not been doing additional exercise. Wt Readings from Last 3 Encounters:  08/17/21 228 lb 12.8 oz (103.8  kg)  06/15/21 228 lb 3.2 oz (103.5 kg)  04/13/21 225 lb 3.2 oz (102.2 kg)   He has a history of testosterone deficiency and is on testosterone replacement via androgel 2pumps. He is on cialis occasionally Lab Results  Component Value Date   TESTOSTERONE 437 10/23/2020    Current Medications:  Current Outpatient Medications on File Prior to Visit  Medication Sig Dispense Refill   apixaban (ELIQUIS) 5 MG TABS tablet Take 5 mg by mouth 2 (two) times daily. (Patient not taking: Reported on 06/15/2021)     atorvastatin (LIPITOR) 40 MG tablet Take 1 tablet (40 mg total) by mouth daily. 90 tablet 3   Cholecalciferol (VITAMIN D) 125 MCG (5000 UT) CAPS Take 1 capsule by mouth 2 (two) times daily.     cyclobenzaprine (FLEXERIL) 10 MG tablet Take 1 tablet (10 mg total) by mouth 3 (three) times daily as needed for muscle spasms. 30 tablet 0   diltiazem (CARDIZEM) 30 MG tablet Take 1 tablet every 4 hours AS NEEDED for heart rate >100 30 tablet 1   Psyllium (METAMUCIL FIBER PO) Take 1 Dose by mouth daily.     tadalafil (CIALIS) 20 MG tablet Take 1/2 to 1 tablet every 2 to 3 days as needed for [name] 30 tablet 0   Testosterone 1.62 % GEL APPLY TWO PUMPS EXTERNALLY ON EACH ARM ONCE DAILY. 450 g 0   valACYclovir (VALTREX) 500 MG tablet Take 1 tablet (500 mg total) by mouth 2 (two) times daily as needed. 30 tablet 0   No current facility-administered medications on file prior to visit.    Medical History:  Past Medical History:  Diagnosis Date   Atrial fibrillation with rapid ventricular response ([facility])    GERD (gastroesophageal reflux disease)    Hyperlipidemia    Other testicular hypofunction    Unspecified vitamin D deficiency    Immunization History  Administered Date(s) Administered   PPD Test 04/03/2013   Td 11/08/2005   Tdap 06/25/2015   Tetanus: 2017 Colonoscopy: 12/2016, FM HX q 5 years due 2023 Eye Exam: 2017 Dentist: Q85month, UTD  Patient Care Team: MUnk Pinto MD as PCP -  General (Internal Medicine) TSueanne Margarita MD as PCP - Sleep Medicine (Cardiology) JMilus Banister MD as Attending Physician (Gastroenterology) CThornell Sartorius MD as Consulting Physician (Otolaryngology) HWillow Ora(Optometry) SJustice Britain MD as Consulting Physician (Orthopedic Surgery) JNewman Pies MD as Consulting Physician (Neurosurgery)   Allergies Allergies  Allergen Reactions   Pravachol [Pravastatin] Other (See Comments)    Fatigue   Rosuvastatin Other (See Comments)    Felt strange    SURGICAL HISTORY He  has a past surgical history that includes Shoulder surgery (2011) and Colonoscopy. FAMILY HISTORY His family history includes Cancer in his mother; Cancer (age of onset: 48 in his maternal grandmother; Colon cancer (age of onset: 443 in his paternal uncle; Colon polyps in  his father; Depression in his maternal grandmother; Heart disease in his paternal grandfather; Hyperlipidemia in his father; Lymphoma in his mother. SOCIAL HISTORY He  reports that he has been smoking cigars. He has never used smokeless tobacco. He reports current alcohol use of about 21.0 standard drinks of alcohol per week. He reports that he does not use drugs. Drinks 4 days a week, has cut back.    Review of Systems  Constitutional:  Negative for chills and fever.  HENT:  Negative for congestion, hearing loss, sinus pain, sore throat and tinnitus.   Eyes:  Negative for blurred vision and double vision.  Respiratory:  Negative for cough, hemoptysis, sputum production, shortness of breath and wheezing.   Cardiovascular:  Negative for chest pain, palpitations and leg swelling.  Gastrointestinal:  Negative for abdominal pain, constipation, diarrhea, heartburn, nausea and vomiting.  Genitourinary:  Negative for dysuria and urgency.  Musculoskeletal:  Negative for back pain, falls, joint pain, myalgias and neck pain.  Skin:  Negative for rash.  Neurological:  Negative for dizziness,  tingling, tremors, weakness and headaches.  Endo/Heme/Allergies:  Does not bruise/bleed easily.  Psychiatric/Behavioral:  Negative for depression and suicidal ideas. The patient is not nervous/anxious and does not have insomnia.       Physical Exam: There were no vitals taken for this visit. Wt Readings from Last 3 Encounters:  08/17/21 228 lb 12.8 oz (103.8 kg)  06/15/21 228 lb 3.2 oz (103.5 kg)  04/13/21 225 lb 3.2 oz (102.2 kg)   General Appearance: Well nourished well developed, in no apparent distress. Eyes: PERRLA, EOMs, conjunctiva no swelling or erythema ENT/Mouth: Ear canals normal without obstruction, swelling, erythma, discharge.  TMs normal bilaterally.  Oropharynx moist, clear, without exudate, or postoropharyngeal swelling. Neck: Supple, thyroid normal,no cervical adenopathy  Respiratory: Respiratory effort normal, Breath sounds clear A&P without rhonchi, wheeze, or rale.  No retractions, no accessory usage. Cardio: RRR with no MRGs. Brisk peripheral pulses without edema.  Abdomen: Soft, + BS,  Non tender, no guarding, rebound, hernias, masses. Musculoskeletal: Full ROM, 5/5 strength, Normal gait,  right medial ankle some erythema, swelling, No warmth, no distal edema.  Skin: Warm, dry without rashes, lesions, ecchymosis.  Neuro: Awake and oriented X 3, Cranial nerves intact. Normal muscle tone, no cerebellar symptoms. Psych: Normal affect, Insight and Judgment appropriate.   EKG: NSR, no ST changes Aorta Defer due to age   Alycia Rossetti, NP 4:10 PM [facility]

## 2021-10-26 ENCOUNTER — Encounter: Payer: Self-pay | Admitting: Nurse Practitioner

## 2021-10-26 ENCOUNTER — Ambulatory Visit (INDEPENDENT_AMBULATORY_CARE_PROVIDER_SITE_OTHER): Payer: Commercial Managed Care - PPO | Admitting: Nurse Practitioner

## 2021-10-26 VITALS — BP 138/82 | HR 65 | Temp 97.7°F | Ht 71.5 in | Wt 237.8 lb

## 2021-10-26 DIAGNOSIS — Z1389 Encounter for screening for other disorder: Secondary | ICD-10-CM

## 2021-10-26 DIAGNOSIS — Z0000 Encounter for general adult medical examination without abnormal findings: Secondary | ICD-10-CM

## 2021-10-26 DIAGNOSIS — G4733 Obstructive sleep apnea (adult) (pediatric): Secondary | ICD-10-CM

## 2021-10-26 DIAGNOSIS — I480 Paroxysmal atrial fibrillation: Secondary | ICD-10-CM | POA: Diagnosis not present

## 2021-10-26 DIAGNOSIS — Z0001 Encounter for general adult medical examination with abnormal findings: Secondary | ICD-10-CM

## 2021-10-26 DIAGNOSIS — Z800 Family history of malignant neoplasm of digestive organs: Secondary | ICD-10-CM

## 2021-10-26 DIAGNOSIS — I700 Atherosclerosis of aorta: Secondary | ICD-10-CM

## 2021-10-26 DIAGNOSIS — E782 Mixed hyperlipidemia: Secondary | ICD-10-CM

## 2021-10-26 DIAGNOSIS — Z125 Encounter for screening for malignant neoplasm of prostate: Secondary | ICD-10-CM

## 2021-10-26 DIAGNOSIS — Z136 Encounter for screening for cardiovascular disorders: Secondary | ICD-10-CM | POA: Diagnosis not present

## 2021-10-26 DIAGNOSIS — I10 Essential (primary) hypertension: Secondary | ICD-10-CM | POA: Diagnosis not present

## 2021-10-26 DIAGNOSIS — Z79899 Other long term (current) drug therapy: Secondary | ICD-10-CM

## 2021-10-26 DIAGNOSIS — E349 Endocrine disorder, unspecified: Secondary | ICD-10-CM

## 2021-10-26 DIAGNOSIS — R7309 Other abnormal glucose: Secondary | ICD-10-CM

## 2021-10-26 DIAGNOSIS — E559 Vitamin D deficiency, unspecified: Secondary | ICD-10-CM

## 2021-10-26 DIAGNOSIS — Z1329 Encounter for screening for other suspected endocrine disorder: Secondary | ICD-10-CM

## 2021-10-26 NOTE — Patient Instructions (Signed)

## 2021-10-27 LAB — COMPLETE METABOLIC PANEL WITH GFR
AG Ratio: 1.8 (calc) (ref 1.0–2.5)
ALT: 49 U/L — ABNORMAL HIGH (ref 9–46)
AST: 24 U/L (ref 10–35)
Albumin: 4.8 g/dL (ref 3.6–5.1)
Alkaline phosphatase (APISO): 58 U/L (ref 35–144)
BUN: 17 mg/dL (ref 7–25)
CO2: 27 mmol/L (ref 20–32)
Calcium: 10 mg/dL (ref 8.6–10.3)
Chloride: 99 mmol/L (ref 98–110)
Creat: 0.72 mg/dL (ref 0.70–1.30)
Globulin: 2.7 g/dL (calc) (ref 1.9–3.7)
Glucose, Bld: 82 mg/dL (ref 65–99)
Potassium: 3.9 mmol/L (ref 3.5–5.3)
Sodium: 136 mmol/L (ref 135–146)
Total Bilirubin: 0.9 mg/dL (ref 0.2–1.2)
Total Protein: 7.5 g/dL (ref 6.1–8.1)
eGFR: 111 mL/min/{1.73_m2} (ref >=60)

## 2021-10-27 LAB — VITAMIN D 25 HYDROXY (VIT D DEFICIENCY, FRACTURES): Vit D, 25-Hydroxy: 54 ng/mL (ref 30–100)

## 2021-10-27 LAB — CBC WITH DIFFERENTIAL/PLATELET
Absolute Monocytes: 759 cells/uL (ref 200–950)
Basophils Absolute: 51 cells/uL (ref 0–200)
Basophils Relative: 0.7 %
Eosinophils Absolute: 139 cells/uL (ref 15–500)
Eosinophils Relative: 1.9 %
HCT: 46.4 % (ref 38.5–50.0)
Hemoglobin: 16.1 g/dL (ref 13.2–17.1)
Lymphs Abs: 3044 cells/uL (ref 850–3900)
MCH: 32.3 pg (ref 27.0–33.0)
MCHC: 34.7 g/dL (ref 32.0–36.0)
MCV: 93.2 fL (ref 80.0–100.0)
MPV: 10.5 fL (ref 7.5–12.5)
Monocytes Relative: 10.4 %
Neutro Abs: 3307 cells/uL (ref 1500–7800)
Neutrophils Relative %: 45.3 %
Platelets: 174 10*3/uL (ref 140–400)
RBC: 4.98 10*6/uL (ref 4.20–5.80)
RDW: 12 % (ref 11.0–15.0)
Total Lymphocyte: 41.7 %
WBC: 7.3 10*3/uL (ref 3.8–10.8)

## 2021-10-27 LAB — PSA: PSA: 1.18 ng/mL (ref <=4.00)

## 2021-10-27 LAB — URINALYSIS, ROUTINE W REFLEX MICROSCOPIC
Bilirubin Urine: NEGATIVE
Glucose, UA: NEGATIVE
Hgb urine dipstick: NEGATIVE
Ketones, ur: NEGATIVE
Leukocytes,Ua: NEGATIVE
Nitrite: NEGATIVE
Protein, ur: NEGATIVE
Specific Gravity, Urine: 1.018 (ref 1.001–1.035)
pH: 5.5 (ref 5.0–8.0)

## 2021-10-27 LAB — MICROALBUMIN / CREATININE URINE RATIO
Creatinine, Urine: 102 mg/dL (ref 20–320)
Microalb Creat Ratio: 5 mcg/mg creat (ref <30)
Microalb, Ur: 0.5 mg/dL

## 2021-10-27 LAB — MAGNESIUM: Magnesium: 2 mg/dL (ref 1.5–2.5)

## 2021-10-27 LAB — TESTOSTERONE: Testosterone: 385 ng/dL (ref 250–827)

## 2021-10-27 LAB — TSH: TSH: 1.91 mIU/L (ref 0.40–4.50)

## 2021-10-27 LAB — LIPID PANEL
Cholesterol: 202 mg/dL — ABNORMAL HIGH (ref <200)
HDL: 90 mg/dL (ref >=40)
LDL Cholesterol (Calc): 87 mg/dL (calc)
Non-HDL Cholesterol (Calc): 112 mg/dL (calc) (ref <130)
Total CHOL/HDL Ratio: 2.2 (calc) (ref <5.0)
Triglycerides: 153 mg/dL — ABNORMAL HIGH (ref <150)

## 2021-10-27 LAB — HEMOGLOBIN A1C
Hgb A1c MFr Bld: 5.7 % of total Hgb — ABNORMAL HIGH (ref <5.7)
Mean Plasma Glucose: 117 mg/dL
eAG (mmol/L): 6.5 mmol/L

## 2022-01-05 ENCOUNTER — Encounter: Payer: Self-pay | Admitting: Gastroenterology

## 2022-01-07 ENCOUNTER — Telehealth: Payer: Self-pay

## 2022-01-07 NOTE — Telephone Encounter (Signed)
Prior Authorization for Testosterone Gel submitted to CoverMyMeds and approved.

## 2022-02-03 ENCOUNTER — Ambulatory Visit (AMBULATORY_SURGERY_CENTER): Payer: Commercial Managed Care - PPO

## 2022-02-03 VITALS — Ht 72.0 in | Wt 225.0 lb

## 2022-02-03 DIAGNOSIS — Z1211 Encounter for screening for malignant neoplasm of colon: Secondary | ICD-10-CM

## 2022-02-03 MED ORDER — NA SULFATE-K SULFATE-MG SULF 17.5-3.13-1.6 GM/177ML PO SOLN: 1.0000 | Freq: Once | ORAL | 0 refills | Status: AC

## 2022-02-03 NOTE — Progress Notes (Signed)
Pre visit completed via phone call; Patient verified name, DOB, and address;  No egg or soy allergy known to patient  No issues known to pt with past sedation with any surgeries or procedures Patient denies ever being told they had issues or difficulty with intubation  No FH of Malignant Hyperthermia Pt is not on diet pills Pt is not on home 02  Pt is not on blood thinners  Pt denies issues with constipation- takes Metamucil daily- states he has to take Metamucil or he does not feel like he completely empties out"- patient reports he will make sure he is having bowel movements daily during the 5 days prior to the prep starting  Dx AFIB Have any cardiac testing pending--NO Pt instructed to use Singlecare.[email or web address] or GoodRx for a price reduction on prep   Insurance verified during [place] appt=[employer]  Patient's chart reviewed by Osvaldo Angst CNRA prior to previsit and patient appropriate for the [facility].  Previsit completed and red dot placed by patient's name on their procedure day (on provider's schedule).    [facility] GoodRx/SingleCare coupon info plaed on Rx;

## 2022-02-17 ENCOUNTER — Encounter: Payer: Self-pay | Admitting: Gastroenterology

## 2022-03-02 ENCOUNTER — Ambulatory Visit (AMBULATORY_SURGERY_CENTER): Payer: Commercial Managed Care - PPO | Admitting: Gastroenterology

## 2022-03-02 ENCOUNTER — Encounter: Payer: Self-pay | Admitting: Gastroenterology

## 2022-03-02 VITALS — BP 145/76 | HR 58 | Temp 98.7°F | Resp 12 | Ht 72.0 in | Wt 225.0 lb

## 2022-03-02 DIAGNOSIS — D123 Benign neoplasm of transverse colon: Secondary | ICD-10-CM

## 2022-03-02 DIAGNOSIS — D122 Benign neoplasm of ascending colon: Secondary | ICD-10-CM

## 2022-03-02 DIAGNOSIS — Z1211 Encounter for screening for malignant neoplasm of colon: Secondary | ICD-10-CM

## 2022-03-02 DIAGNOSIS — D120 Benign neoplasm of cecum: Secondary | ICD-10-CM

## 2022-03-02 DIAGNOSIS — K635 Polyp of colon: Secondary | ICD-10-CM

## 2022-03-02 DIAGNOSIS — D128 Benign neoplasm of rectum: Secondary | ICD-10-CM

## 2022-03-02 MED ORDER — CALMOL-4 76-10 % RE SUPP: 1.0000 | Freq: Every evening | RECTAL | 0 refills | Status: AC

## 2022-03-02 MED ORDER — SODIUM CHLORIDE 0.9 % IV SOLN: 500.0000 mL | Freq: Once | INTRAVENOUS | Status: DC

## 2022-03-02 NOTE — Progress Notes (Signed)
Called to room to assist during endoscopic procedure.  Patient ID and intended procedure confirmed with present staff. Received instructions for my participation in the procedure from the performing physician.  

## 2022-03-02 NOTE — Patient Instructions (Signed)
**  Handouts given on polyps, hemorrhoids, high fiber diet and hemorrhoidal banding** Try new suppository and then send Dr. Rush Landmark a myChart message    YOU HAD AN ENDOSCOPIC PROCEDURE TODAY AT [facility]:   Refer to the procedure report that was given to you for any specific questions about what was found during the examination.  If the procedure report does not answer your questions, please call your gastroenterologist to clarify.  If you requested that your care partner not be given the details of your procedure findings, then the procedure report has been included in a sealed envelope for you to review at your convenience later.  YOU SHOULD EXPECT: Some feelings of bloating in the abdomen. Passage of more gas than usual.  Walking can help get rid of the air that was put into your GI tract during the procedure and reduce the bloating. If you had a lower endoscopy (such as a colonoscopy or flexible sigmoidoscopy) you may notice spotting of blood in your stool or on the toilet paper. If you underwent a bowel prep for your procedure, you may not have a normal bowel movement for a few days.  Please Note:  You might notice some irritation and congestion in your nose or some drainage.  This is from the oxygen used during your procedure.  There is no need for concern and it should clear up in a day or so.  SYMPTOMS TO REPORT IMMEDIATELY:  Following lower endoscopy (colonoscopy or flexible sigmoidoscopy):  Excessive amounts of blood in the stool  Significant tenderness or worsening of abdominal pains  Swelling of the abdomen that is new, acute  Fever of 100F or higher   For urgent or emergent issues, a gastroenterologist can be reached at any hour by calling [phone]. Do not use MyChart messaging for urgent concerns.    DIET:  We do recommend a small meal at first, but then you may proceed to your regular diet.  Drink plenty of fluids but you should avoid alcoholic  beverages for 24 hours.  ACTIVITY:  You should plan to take it easy for the rest of today and you should NOT DRIVE or use heavy machinery until tomorrow (because of the sedation medicines used during the test).    FOLLOW UP: Our staff will call the number listed on your records the next business day following your procedure.  We will call around 7:15- 8:00 am to check on you and address any questions or concerns that you may have regarding the information given to you following your procedure. If we do not reach you, we will leave a message.     If any biopsies were taken you will be contacted by phone or by letter within the next 1-3 weeks.  Please call us at [phone] if you have not heard about the biopsies in 3 weeks.    SIGNATURES/CONFIDENTIALITY: You and/or your care partner have signed paperwork which will be entered into your electronic medical record.  These signatures attest to the fact that that the information above on your After Visit Summary has been reviewed and is understood.  Full responsibility of the confidentiality of this discharge information lies with you and/or your care-partner.

## 2022-03-02 NOTE — Progress Notes (Signed)
Pt's states no medical or surgical changes since previsit or office visit. 

## 2022-03-02 NOTE — Progress Notes (Signed)
Vss nad trans to pacu °

## 2022-03-02 NOTE — Op Note (Signed)
[facility] Patient Name: Michael Kim Procedure Date: 03/02/2022 2:19 PM MRN: [id number] Endoscopist: Justice Britain , MD, [id number] Age: 52 Referring MD:  Date of Birth: 03-25-1970 Gender: Male Account #: [id number] Procedure:                Colonoscopy Indications:              Colon cancer screening in patient at increased                            risk: Family history of 2nd-degree relative with                            colon polyps before age 87 years, Screening in                            patient at increased risk: Family history of                            1st-degree relative with colorectal cancer Medicines:                Monitored Anesthesia Care Procedure:                Pre-Anesthesia Assessment:                           - Prior to the procedure, a History and Physical                            was performed, and patient medications and                            allergies were reviewed. The patient's tolerance of                            previous anesthesia was also reviewed. The risks                            and benefits of the procedure and the sedation                            options and risks were discussed with the patient.                            All questions were answered, and informed consent                            was obtained. Prior Anticoagulants: The patient has                            taken no anticoagulant or antiplatelet agents. ASA                            Grade Assessment: III - A patient with severe  systemic disease. After reviewing the risks and                            benefits, the patient was deemed in satisfactory                            condition to undergo the procedure.                           After obtaining informed consent, the colonoscope                            was passed under direct vision. Throughout the                            procedure, the patient's  blood pressure, pulse, and                            oxygen saturations were monitored continuously. The                            Olympus CF-[id number] SN [id number] was introduced through                            the anus and advanced to the 3 cm into the ileum.                            The colonoscopy was performed without difficulty.                            The patient tolerated the procedure. The quality of                            the bowel preparation was good. The terminal ileum,                            ileocecal valve, appendiceal orifice, and rectum                            were photographed. Scope In: 2:47:35 PM Scope Out: 3:03:59 PM Scope Withdrawal Time: 0 hours 13 minutes 49 seconds  Total Procedure Duration: 0 hours 16 minutes 24 seconds  Findings:                 The digital rectal exam findings include                            hemorrhoids. Pertinent negatives include no                            palpable rectal lesions.                           The terminal ileum and ileocecal valve appeared  normal.                           The exam was otherwise without abnormality.                           Five sessile polyps were found in the rectum (1),                            transverse colon (1), ascending colon (1) and cecum                            (2). The polyps were 2 to 7 mm in size. These                            polyps were removed with a cold snare. Resection                            and retrieval were complete.                           Normal mucosa was found in the entire colon                            otherwise.                           Non-bleeding non-thrombosed external and internal                            hemorrhoids were found during retroflexion, during                            perianal exam and during digital exam. The                            hemorrhoids were Grade III (internal hemorrhoids                             that prolapse but require manual reduction). Complications:            No immediate complications. Estimated Blood Loss:     Estimated blood loss was minimal. Impression:               - Hemorrhoids found on digital rectal exam.                           - The examined portion of the ileum was normal.                           - The examination was otherwise normal.                           - Five, 2 to 7 mm polyps in the rectum, in the  transverse colon, in the ascending colon and in the                            cecum, removed with a cold snare. Resected and                            retrieved.                           - Normal mucosa in the entire examined colon                            otherwise.                           - Non-bleeding non-thrombosed external and internal                            hemorrhoids. Recommendation:           - The patient will be observed post-procedure,                            until all discharge criteria are met.                           - Discharge patient to home.                           - Patient has a contact number available for                            emergencies. The signs and symptoms of potential                            delayed complications were discussed with the                            patient. Return to normal activities tomorrow.                            Written discharge instructions were provided to the                            patient.                           - High fiber diet.                           - Use FiberCon 1-2 tablets PO daily.                           - Continue present medications.                           - Recommend initiation of Calmol-4 suppositories  nightly for 1 week and then every other night until                            completed a total of 12 suppositories.                           - Pending how patient does  with Calmol-4, could                            consider Anusol suppositories.                           - Could also consider staggering Anusol and                            Calmol-4 suppositories.                           - Will review patient's imaging with my partners to                            perform hemorrhoidal banding as I do think he may                            be a candidate for this in the setting of the minor                            bleeding he is experiencing as well. The external                            hemorrhoidal component could be problematic however.                           - Await pathology results.                           - Repeat colonoscopy in 3 - 5 years for                            surveillance based on pathology results.                           - The findings and recommendations were discussed                            with the patient.                           - The findings and recommendations were discussed                            with the patient's family. Justice Britain, MD 03/02/2022 3:12:05 PM

## 2022-03-02 NOTE — Progress Notes (Signed)
GASTROENTEROLOGY PROCEDURE H&P NOTE   Primary Care Physician: Unk Pinto, MD  HPI: Michael Kim is a 52 y.o. male who presents for Colonoscopy for high-risk screening (FHx Colon Polyps at young age and Colon Cancer (uncle)).  Past Medical History:  Diagnosis Date   Atrial fibrillation with rapid ventricular response (HCC)    GERD (gastroesophageal reflux disease)    Hyperlipidemia    on meds   Other testicular hypofunction    Sleep apnea    mouthpiece used nightly   Unspecified vitamin D deficiency    Past Surgical History:  Procedure Laterality Date   COLONOSCOPY  02/2016   DJ-MAC-prep good-int hems-45yrrecall- no polyps   SHOULDER SURGERY Right 2014   UV joint-collar bone   Current Outpatient Medications  Medication Sig Dispense Refill   Cholecalciferol (VITAMIN D) 125 MCG (5000 UT) CAPS Take 1 capsule by mouth 2 (two) times daily.     Psyllium (METAMUCIL FIBER PO) Take 1 Dose by mouth daily.     Testosterone 1.62 % GEL APPLY TWO PUMPS EXTERNALLY ON EACH ARM ONCE DAILY. 450 g 0   atorvastatin (LIPITOR) 40 MG tablet Take 1 tablet (40 mg total) by mouth daily. (Patient not taking: Reported on 03/02/2022) 90 tablet 3   cyclobenzaprine (FLEXERIL) 10 MG tablet Take 1 tablet (10 mg total) by mouth 3 (three) times daily as needed for muscle spasms. 30 tablet 0   diltiazem (CARDIZEM) 30 MG tablet Take 1 tablet every 4 hours AS NEEDED for heart rate >100 (Patient not taking: Reported on 03/02/2022) 30 tablet 1   tadalafil (CIALIS) 20 MG tablet Take 1/2 to 1 tablet every 2 to 3 days as needed for [name] 30 tablet 0   valACYclovir (VALTREX) 500 MG tablet Take 1 tablet (500 mg total) by mouth 2 (two) times daily as needed. (Patient not taking: Reported on 03/02/2022) 30 tablet 0   Current Facility-Administered Medications  Medication Dose Route Frequency Provider Last Rate Last Admin   0.9 %  sodium chloride infusion  500 mL Intravenous Once Mansouraty, GTelford Nab, MD         Current Outpatient Medications:    Cholecalciferol (VITAMIN D) 125 MCG (5000 UT) CAPS, Take 1 capsule by mouth 2 (two) times daily., Disp: , Rfl:    Psyllium (METAMUCIL FIBER PO), Take 1 Dose by mouth daily., Disp: , Rfl:    Testosterone 1.62 % GEL, APPLY TWO PUMPS EXTERNALLY ON EACH ARM ONCE DAILY., Disp: 450 g, Rfl: 0   atorvastatin (LIPITOR) 40 MG tablet, Take 1 tablet (40 mg total) by mouth daily. (Patient not taking: Reported on 03/02/2022), Disp: 90 tablet, Rfl: 3   cyclobenzaprine (FLEXERIL) 10 MG tablet, Take 1 tablet (10 mg total) by mouth 3 (three) times daily as needed for muscle spasms., Disp: 30 tablet, Rfl: 0   diltiazem (CARDIZEM) 30 MG tablet, Take 1 tablet every 4 hours AS NEEDED for heart rate >100 (Patient not taking: Reported on 03/02/2022), Disp: 30 tablet, Rfl: 1   tadalafil (CIALIS) 20 MG tablet, Take 1/2 to 1 tablet every 2 to 3 days as needed for [name], Disp: 30 tablet, Rfl: 0   valACYclovir (VALTREX) 500 MG tablet, Take 1 tablet (500 mg total) by mouth 2 (two) times daily as needed. (Patient not taking: Reported on 03/02/2022), Disp: 30 tablet, Rfl: 0  Current Facility-Administered Medications:    0.9 %  sodium chloride infusion, 500 mL, Intravenous, Once, Mansouraty, GTelford Nab, MD Allergies  Allergen Reactions   Pravachol [  Pravastatin] Other (See Comments)    Fatigue   Rosuvastatin Other (See Comments)    "Feel strange"   Family History  Problem Relation Age of Onset   Lymphoma Mother    Cancer Mother        lymphoma   Colon polyps Father 70   Hyperlipidemia Father    Colon cancer Paternal Uncle 89   Depression Maternal Grandmother    Cancer Maternal Grandmother 26       fatal colon   Heart disease Paternal Grandfather    Stomach cancer Neg Hx    Esophageal cancer Neg Hx    Rectal cancer Neg Hx    Social History   Socioeconomic History   Marital status: Significant Other    Spouse name: Not on file   Number of children: Not on file   Years of  education: Not on file   Highest education level: Not on file  Occupational History   Occupation: Golf Pro    Employer: [employer]    Comment: [place]  Tobacco Use   Smoking status: Some Days    Types: Cigars    Last attempt to quit: 07/29/2006    Years since quitting: 15.6   Smokeless tobacco: Never  Vaping Use   Vaping Use: Never used  Substance and Sexual Activity   Alcohol use: Not Currently    Alcohol/week: 0.0 - 12.0 standard drinks of alcohol    Comment: 3-4 BEERS PER DAY PER PT   Drug use: No   Sexual activity: Yes  Other Topics Concern   Not on file  Social History Narrative   He is a golf pro at a [employer]   Social Determinants of Radio broadcast assistant Strain: Not on file  Food Insecurity: Not on file  Transportation Needs: Not on file  Physical Activity: Not on file  Stress: Not on file  Social Connections: Not on file  Intimate Partner Violence: Not on file    Physical Exam: Today's Vitals   03/02/22 1345 03/02/22 1352  BP: 127/74   Pulse: 75   Temp: 98.7 F (37.1 C) 98.7 F (37.1 C)  TempSrc: Skin   SpO2: 97%   Weight: 225 lb (102.1 kg)   Height: 6' (1.829 m)    Body mass index is 30.52 kg/m. GEN: NAD EYE: Sclerae anicteric ENT: MMM CV: Non-tachycardic GI: Soft, NT/ND NEURO:  Alert & Oriented x 3  Lab Results: No results for input(s): "WBC", "HGB", "HCT", "PLT" in the last 72 hours. BMET No results for input(s): "NA", "K", "CL", "CO2", "GLUCOSE", "BUN", "CREATININE", "CALCIUM" in the last 72 hours. LFT No results for input(s): "PROT", "ALBUMIN", "AST", "ALT", "ALKPHOS", "BILITOT", "BILIDIR", "IBILI" in the last 72 hours. PT/INR No results for input(s): "LABPROT", "INR" in the last 72 hours.   Impression / Plan: This is a 52 y.o.male who presents for Colonoscopy for high-risk screening (FHx Colon Polyps at young age and Colon Cancer (uncle)).  The risks and benefits of endoscopic evaluation/treatment were discussed  with the patient and/or family; these include but are not limited to the risk of perforation, infection, bleeding, missed lesions, lack of diagnosis, severe illness requiring hospitalization, as well as anesthesia and sedation related illnesses.  The patient's history has been reviewed, patient examined, no change in status, and deemed stable for procedure.  The patient and/or family is agreeable to proceed.    Justice Britain, MD [facility]oscopy Office # [id number]

## 2022-03-03 ENCOUNTER — Telehealth: Payer: Self-pay

## 2022-03-03 NOTE — Telephone Encounter (Signed)
  Follow up Call-     03/02/2022    1:52 PM  Call back number  Post procedure Call Back phone  # [phone]  Permission to leave phone message Yes     Patient questions:  Do you have a fever, pain , or abdominal swelling? No. Pain Score  0 *  Have you tolerated food without any problems? Yes.    Have you been able to return to your normal activities? Yes.    Do you have any questions about your discharge instructions: Diet   No. Medications  No. Follow up visit  No.  Do you have questions or concerns about your Care? No.  Actions: * If pain score is 4 or above: No action needed, pain <4.

## 2022-03-09 ENCOUNTER — Encounter: Payer: Self-pay | Admitting: Gastroenterology

## 2022-04-29 ENCOUNTER — Telehealth: Payer: Self-pay | Admitting: Nurse Practitioner

## 2022-04-29 NOTE — Progress Notes (Deleted)
6 MONTH FOLLOW UP   Assessment and Plan:   Michael Kim was seen today for 6 month follow up  Diagnoses and all orders for this visit:    Hypertension, unspecified type Controlled today Monitor blood pressure at home; call if consistently over 130/80 Continue DASH diet.   Reminder to go to the ER if any CP, SOB, nausea, dizziness, severe HA, changes vision/speech, left arm numbness and tingling and jaw pain. -     Magnesium  Mixed hyperlipidemia Coronary calcium score 0, Continue Atorvastatin 40 mg QD Discussed dietary and exercise modifications Low fat diet -     Lipid panel  Vitamin D deficiency Continue supplementation to maintain goal of 70-100 Taking Vitamin D 1,000 IU daily -     VITAMIN D 25 Hydroxy (Vit-D Deficiency, Fractures)  Paroxysmal Atrial Fibrillation(HCC) Continue night guard mouthpiece Continue to follow with Dr. Melburn Popper Uses Eliquis and diltiazem only if he develops A Fib , last episode approx 1 year ago Avoid ETHO, caffeine -     EKG 12-Lead  OSA on CPAP Does not use CPAP using a mouth piece Helping with daytime fatigue Weight loss still advised Discussed mask & tubing hygeine  Testosterone deficiency Continue testosterone gel supplementation - Testosterone  Abnormal Glucose Continue diet and exercise - A1c  Medication management Continued  Overweight, BMI 29.0-29.9,adult Discussed dietary and exercise modifications  Elevated LFTS   Further disposition pending results if labs check today. Discussed med's effects and SE's.   Over 30 minutes of face to face interview, exam, counseling, chart review, and critical decision making was performed.    Future Appointments  Date Time Provider Department Center  05/03/2022  2:30 PM Raynelle Dick, NP [facility] None  10/27/2022  2:00 PM Raynelle Dick, NP [facility] None    HPI 52 y.o. male  presents for complete physical and follow up on HTN, HLD,  Prediabetes, OSA on CPAP, testosterone  deficency and vitamin D.  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     His blood pressure has been controlled at home 120/80's, today their BP is  .  BP Readings from Last 3 Encounters:  03/02/22 (!) 145/76  10/26/21 138/82  08/17/21 122/82    He does workout. He denies chest pain, shortness of breath, dizziness.  He has history of Afib, folllows cardio CHADSVAC 1, on bASA. He had an episode of A Fib 3 months ago.  Takes Eliquis and Diltiazem when his A Fib is occurring.   He is using a mouth piece for his sleep apnea and uses 100% of time.   He is on cholesterol medication, Atorvastatin 40 mg and denies myalgias. His cholesterol is at goal. The cholesterol last visit was:  Did very well on Nexlizet but insurance does not cover now. Cardiac calcium score  is zero 07/30/20. Lab Results  Component Value Date   CHOL 202 (H) 10/26/2021   HDL 90 10/26/2021   LDLCALC 87 10/26/2021   TRIG 153 (H) 10/26/2021   CHOLHDL 2.2 10/26/2021    He has been working on diet and exercise for prediabetes, and denies foot ulcerations, hyperglycemia, hypoglycemia , increased appetite, nausea, paresthesia of the feet, polydipsia, polyuria, visual disturbances, vomiting and weight loss. Last A1C in the office was:  Lab Results  Component Value Date   HGBA1C 5.7 (H) 10/26/2021   Patient is on Vitamin D supplement for deficency.  Last check was not to goal. Lab Results  Component Value Date   VD25OH 54 10/26/2021  BMI is There is no height or weight on file to calculate BMI., he is working on diet and exercise. Lots of activity with his job has not been doing additional exercise. He has not been working on his diet as much. He is drinking a lot of water.  Wt Readings from Last 3 Encounters:  03/02/22 225 lb (102.1 kg)  02/03/22 225 lb (102.1 kg)  10/26/21 237 lb 12.8 oz (107.9 kg)   He has a history of testosterone deficiency and is on testosterone replacement via androgel 2pumps. He is on cialis  occasionally Lab Results  Component Value Date   TESTOSTERONE 385 10/26/2021    Current Medications:  Current Outpatient Medications on File Prior to Visit  Medication Sig Dispense Refill   atorvastatin (LIPITOR) 40 MG tablet Take 1 tablet (40 mg total) by mouth daily. (Patient not taking: Reported on 03/02/2022) 90 tablet 3   Cholecalciferol (VITAMIN D) 125 MCG (5000 UT) CAPS Take 1 capsule by mouth 2 (two) times daily.     cyclobenzaprine (FLEXERIL) 10 MG tablet Take 1 tablet (10 mg total) by mouth 3 (three) times daily as needed for muscle spasms. 30 tablet 0   diltiazem (CARDIZEM) 30 MG tablet Take 1 tablet every 4 hours AS NEEDED for heart rate >100 (Patient not taking: Reported on 03/02/2022) 30 tablet 1   Psyllium (METAMUCIL FIBER PO) Take 1 Dose by mouth daily.     tadalafil (CIALIS) 20 MG tablet Take 1/2 to 1 tablet every 2 to 3 days as needed for [name] 30 tablet 0   Testosterone 1.62 % GEL APPLY TWO PUMPS EXTERNALLY ON EACH ARM ONCE DAILY. 450 g 0   valACYclovir (VALTREX) 500 MG tablet Take 1 tablet (500 mg total) by mouth 2 (two) times daily as needed. (Patient not taking: Reported on 03/02/2022) 30 tablet 0   No current facility-administered medications on file prior to visit.    Medical History:  Past Medical History:  Diagnosis Date   Atrial fibrillation with rapid ventricular response (HCC)    GERD (gastroesophageal reflux disease)    Hyperlipidemia    on meds   Other testicular hypofunction    Sleep apnea    mouthpiece used nightly   Unspecified vitamin D deficiency    Immunization History  Administered Date(s) Administered   PPD Test 04/03/2013   Td 11/08/2005   Tdap 06/25/2015   Tetanus: 2017 Colonoscopy: 12/2016, FM HX q 5 years due 2023 Eye Exam: 2017 Dentist: Q69months, UTD  Patient Care Team: Lucky Cowboy, MD as PCP - General (Internal Medicine) Quintella Reichert, MD as PCP - Sleep Medicine (Cardiology) Rachael Fee, MD as Attending Physician  (Gastroenterology) Keturah Barre, MD as Consulting Physician (Otolaryngology) Marcille Buffy (Optometry) Francena Hanly, MD as Consulting Physician (Orthopedic Surgery) Tressie Stalker, MD as Consulting Physician (Neurosurgery)   Allergies Allergies  Allergen Reactions   Pravachol [Pravastatin] Other (See Comments)    Fatigue   Rosuvastatin Other (See Comments)    "Feel strange"    SURGICAL HISTORY He  has a past surgical history that includes Shoulder surgery (Right, 2014) and Colonoscopy (02/2016). FAMILY HISTORY His family history includes Cancer in his mother; Cancer (age of onset: 44) in his maternal grandmother; Colon cancer (age of onset: 85) in his paternal uncle; Colon polyps (age of onset: 70) in his father; Depression in his maternal grandmother; Heart disease in his paternal grandfather; Hyperlipidemia in his father; Lymphoma in his mother. SOCIAL HISTORY He  reports that he has  been smoking cigars. He has never used smokeless tobacco. He reports that he does not currently use alcohol. He reports that he does not use drugs. Drinks 4 days a week, has cut back.  Smokes 2 cigars a week   Review of Systems  Constitutional:  Negative for chills and fever.  HENT:  Negative for congestion, hearing loss, sinus pain, sore throat and tinnitus.   Eyes:  Negative for blurred vision and double vision.  Respiratory:  Negative for cough, hemoptysis, sputum production, shortness of breath and wheezing.   Cardiovascular:  Negative for chest pain, palpitations and leg swelling.  Gastrointestinal:  Negative for abdominal pain, constipation, diarrhea, heartburn, nausea and vomiting.  Genitourinary:  Negative for dysuria and urgency.  Musculoskeletal:  Negative for back pain, falls, joint pain, myalgias and neck pain.  Skin:  Negative for rash.  Neurological:  Negative for dizziness, tingling, tremors, weakness and headaches.  Endo/Heme/Allergies:  Does not bruise/bleed easily.   Psychiatric/Behavioral:  Negative for depression and suicidal ideas. The patient is not nervous/anxious and does not have insomnia.       Physical Exam: There were no vitals taken for this visit. Wt Readings from Last 3 Encounters:  03/02/22 225 lb (102.1 kg)  02/03/22 225 lb (102.1 kg)  10/26/21 237 lb 12.8 oz (107.9 kg)   General Appearance: Well nourished well developed, in no apparent distress. Eyes: PERRLA, EOMs, conjunctiva no swelling or erythema ENT/Mouth: Ear canals normal without obstruction, swelling, erythma, discharge.  TMs normal bilaterally.  Oropharynx moist, clear, without exudate, or postoropharyngeal swelling. Neck: Supple, thyroid normal,no cervical adenopathy  Respiratory: Respiratory effort normal, Breath sounds clear A&P without rhonchi, wheeze, or rale.  No retractions, no accessory usage. Cardio: RRR with no MRGs. Brisk peripheral pulses without edema.  Abdomen: Soft, + BS,  Non tender, no guarding, rebound, hernias, masses. Musculoskeletal: Full ROM, 5/5 strength, Normal gait,  right medial ankle some erythema, swelling, No warmth, no distal edema.  Skin: Warm, dry without rashes, lesions, ecchymosis.  Neuro: Awake and oriented X 3, Cranial nerves intact. Normal muscle tone, no cerebellar symptoms. Psych: Normal affect, Insight and Judgment appropriate.   EKG: NSR, no ST changes Aorta < 3 cm   Nihira Puello Hollie Salk, NP 4:10 PM [facility]

## 2022-04-29 NOTE — Telephone Encounter (Signed)
Pt has not been seen since October and is requesting a  refill on test inj. He was scheduled to see you Monday but said he can't schedule right now and got accept when I told him I didn't know if they could fill it bc we need to do blood work to check his levels. Can his test be sent in or does it need to wait until he schedules?

## 2022-04-29 NOTE — Telephone Encounter (Signed)
He needs to be seen every 6 months for testosterone

## 2022-05-02 NOTE — Telephone Encounter (Signed)
Yes I would say since he is establishing with a new PCP we can close his chart- please call to verify

## 2022-05-03 ENCOUNTER — Ambulatory Visit: Payer: Commercial Managed Care - PPO | Admitting: Nurse Practitioner

## 2022-05-04 ENCOUNTER — Ambulatory Visit: Payer: Commercial Managed Care - PPO | Admitting: Family Medicine

## 2022-05-04 ENCOUNTER — Encounter: Payer: Self-pay | Admitting: Family Medicine

## 2022-05-04 VITALS — BP 132/84 | HR 65 | Temp 97.8°F | Ht 72.0 in | Wt 241.0 lb

## 2022-05-04 DIAGNOSIS — R399 Unspecified symptoms and signs involving the genitourinary system: Secondary | ICD-10-CM | POA: Diagnosis not present

## 2022-05-04 DIAGNOSIS — R5383 Other fatigue: Secondary | ICD-10-CM | POA: Diagnosis not present

## 2022-05-04 DIAGNOSIS — I480 Paroxysmal atrial fibrillation: Secondary | ICD-10-CM

## 2022-05-04 DIAGNOSIS — R7303 Prediabetes: Secondary | ICD-10-CM | POA: Diagnosis not present

## 2022-05-04 DIAGNOSIS — E66811 Obesity, class 1: Secondary | ICD-10-CM

## 2022-05-04 DIAGNOSIS — E291 Testicular hypofunction: Secondary | ICD-10-CM | POA: Diagnosis not present

## 2022-05-04 DIAGNOSIS — E669 Obesity, unspecified: Secondary | ICD-10-CM

## 2022-05-04 DIAGNOSIS — G4733 Obstructive sleep apnea (adult) (pediatric): Secondary | ICD-10-CM

## 2022-05-04 DIAGNOSIS — E559 Vitamin D deficiency, unspecified: Secondary | ICD-10-CM

## 2022-05-04 DIAGNOSIS — Z7689 Persons encountering health services in other specified circumstances: Secondary | ICD-10-CM | POA: Diagnosis not present

## 2022-05-04 DIAGNOSIS — E782 Mixed hyperlipidemia: Secondary | ICD-10-CM

## 2022-05-04 LAB — CBC WITH DIFFERENTIAL/PLATELET
Basophils Absolute: 0 10*3/uL (ref 0.0–0.1)
Basophils Relative: 0.5 % (ref 0.0–3.0)
Eosinophils Absolute: 0.1 10*3/uL (ref 0.0–0.7)
Eosinophils Relative: 2.4 % (ref 0.0–5.0)
HCT: 46 % (ref 39.0–52.0)
Hemoglobin: 15.9 g/dL (ref 13.0–17.0)
Lymphocytes Relative: 32.5 % (ref 12.0–46.0)
Lymphs Abs: 1.7 10*3/uL (ref 0.7–4.0)
MCHC: 34.5 g/dL (ref 30.0–36.0)
MCV: 96 fl (ref 78.0–100.0)
Monocytes Absolute: 0.6 10*3/uL (ref 0.1–1.0)
Monocytes Relative: 11.4 % (ref 3.0–12.0)
Neutro Abs: 2.8 10*3/uL (ref 1.4–7.7)
Neutrophils Relative %: 53.2 % (ref 43.0–77.0)
Platelets: 161 10*3/uL (ref 150.0–400.0)
RBC: 4.8 Mil/uL (ref 4.22–5.81)
RDW: 12.9 % (ref 11.5–15.5)
WBC: 5.3 10*3/uL (ref 4.0–10.5)

## 2022-05-04 LAB — COMPREHENSIVE METABOLIC PANEL
ALT: 30 U/L (ref 0–53)
AST: 20 U/L (ref 0–37)
Albumin: 4.3 g/dL (ref 3.5–5.2)
Alkaline Phosphatase: 53 U/L (ref 39–117)
BUN: 12 mg/dL (ref 6–23)
CO2: 28 mEq/L (ref 19–32)
Calcium: 9.4 mg/dL (ref 8.4–10.5)
Chloride: 102 mEq/L (ref 96–112)
Creatinine, Ser: 0.74 mg/dL (ref 0.40–1.50)
GFR: 105.25 mL/min (ref >60.00)
Glucose, Bld: 104 mg/dL — ABNORMAL HIGH (ref 70–99)
Potassium: 4.3 mEq/L (ref 3.5–5.1)
Sodium: 137 mEq/L (ref 135–145)
Total Bilirubin: 0.7 mg/dL (ref 0.2–1.2)
Total Protein: 6.8 g/dL (ref 6.0–8.3)

## 2022-05-04 LAB — URINALYSIS, ROUTINE W REFLEX MICROSCOPIC
Bilirubin Urine: NEGATIVE
Ketones, ur: NEGATIVE
Leukocytes,Ua: NEGATIVE
Nitrite: NEGATIVE
RBC / HPF: NONE SEEN (ref 0–?)
Specific Gravity, Urine: 1.025 (ref 1.000–1.030)
Total Protein, Urine: NEGATIVE
Urine Glucose: NEGATIVE
Urobilinogen, UA: 0.2 (ref 0.0–1.0)
pH: 6 (ref 5.0–8.0)

## 2022-05-04 LAB — LDL CHOLESTEROL, DIRECT: Direct LDL: 213 mg/dL

## 2022-05-04 LAB — HEMOGLOBIN A1C: Hgb A1c MFr Bld: 5.5 % (ref 4.6–6.5)

## 2022-05-04 LAB — LIPID PANEL
Cholesterol: 321 mg/dL — ABNORMAL HIGH (ref 0–200)
HDL: 66.2 mg/dL (ref >39.00)
Total CHOL/HDL Ratio: 5
Triglycerides: 411 mg/dL — ABNORMAL HIGH (ref 0.0–149.0)

## 2022-05-04 LAB — VITAMIN D 25 HYDROXY (VIT D DEFICIENCY, FRACTURES): VITD: 30.94 ng/mL (ref 30.00–100.00)

## 2022-05-04 LAB — TSH: TSH: 1.51 u[IU]/mL (ref 0.35–5.50)

## 2022-05-04 LAB — T4, FREE: Free T4: 0.74 ng/dL (ref 0.60–1.60)

## 2022-05-04 LAB — VITAMIN B12: Vitamin B-12: 193 pg/mL — ABNORMAL LOW (ref 211–911)

## 2022-05-04 LAB — PSA: PSA: 1.65 ng/mL (ref 0.10–4.00)

## 2022-05-04 LAB — TESTOSTERONE: Testosterone: 290.41 ng/dL — ABNORMAL LOW (ref 300.00–890.00)

## 2022-05-04 MED ORDER — TESTOSTERONE 1.62 % TD GEL
TRANSDERMAL | 0 refills | Status: DC
Start: 2022-05-04 — End: 2022-06-22

## 2022-05-04 NOTE — Assessment & Plan Note (Signed)
Is currently not on statin therapy. Check lipids and follow up.

## 2022-05-04 NOTE — Assessment & Plan Note (Signed)
Check labs to look for complications related to obesity. He is motivated to lose weight. Discussed cutting back on foods high in fat, sugar, carbohydrates, etc. Mediterranean diet handout provided.

## 2022-05-04 NOTE — Assessment & Plan Note (Signed)
May be multifactorial. Testosterone therapy has not helped. Check labs to look for underlying etiology.

## 2022-05-04 NOTE — Patient Instructions (Signed)
It was a pleasure meeting you.  Thank you for trusting [place] with your health care.  Please go downstairs for a urine test and labs.  We will be in touch with your results and recommendations.  Please call and schedule with your cardiologist as discussed.  I have referred you to [facility] and they will call you to schedule a visit.

## 2022-05-04 NOTE — Assessment & Plan Note (Signed)
Follow up pending vitamin D level 

## 2022-05-04 NOTE — Assessment & Plan Note (Signed)
On topical testosterone for the past 2-3 years. Having lower urinary tract symptoms. Referral to urologist.

## 2022-05-04 NOTE — Progress Notes (Signed)
New Patient Office Visit  Subjective    Patient ID: Michael Kim, male    DOB: 18-Apr-1970  Age: 52 y.o. MRN: [id number]  CC:  Chief Complaint  Patient presents with   Establish Care    Talk about getting back on a statin    HPI Michael Kim presents to establish care Previous PCP- Dr. Oneta Rack   On topical testosterone for hypogonadism and has been for the past 2-3 years.  Will run out in 3 days  Testosterone did not improve libido and has not helped fatigue.   ALT - mildly elevated   A1c 5.7 - new diagnosis of prediabetes.   States his diet is unhealthy. Drinks beer.  Weight gain over the past year   Valtrex prn for cold sores   A-fib - states he takes Eliquis and diltiazem when he has an episode. Followed by A-fib clinic.   He was statin therapy, stopped it due to elevated ALT.  He took atorvastatin for about a year.    OSA- not using CPAP. AHI 34/hr Using a mouthpiece now. Feels like it is helping him. Sleeping better.  He checked into [facility].      Outpatient Encounter Medications as of 05/04/2022  Medication Sig   Cholecalciferol (VITAMIN D) 125 MCG (5000 UT) CAPS Take 1 capsule by mouth 2 (two) times daily.   cyclobenzaprine (FLEXERIL) 10 MG tablet Take 10 mg by mouth 3 (three) times daily as needed for muscle spasms.   diltiazem (CARDIZEM) 30 MG tablet Take 1 tablet every 4 hours AS NEEDED for heart rate >100   Psyllium (METAMUCIL FIBER PO) Take 1 Dose by mouth daily.   tadalafil (CIALIS) 20 MG tablet Take 1/2 to 1 tablet every 2 to 3 days as needed for [name]   valACYclovir (VALTREX) 500 MG tablet Take 1 tablet (500 mg total) by mouth 2 (two) times daily as needed.   [DISCONTINUED] cyclobenzaprine (FLEXERIL) 10 MG tablet Take 1 tablet (10 mg total) by mouth 3 (three) times daily as needed for muscle spasms.   [DISCONTINUED] Testosterone 1.62 % GEL APPLY TWO PUMPS EXTERNALLY ON EACH ARM ONCE DAILY.   atorvastatin (LIPITOR) 40 MG tablet Take 1 tablet (40  mg total) by mouth daily. (Patient not taking: Reported on 05/04/2022)   Testosterone 1.62 % GEL APPLY TWO PUMPS EXTERNALLY ON EACH ARM ONCE DAILY.   No facility-administered encounter medications on file as of 05/04/2022.    Past Medical History:  Diagnosis Date   Atrial fibrillation with rapid ventricular response (HCC)    GERD (gastroesophageal reflux disease)    Hyperlipidemia    on meds   Other testicular hypofunction    Sleep apnea    mouthpiece used nightly   Unspecified vitamin D deficiency     Past Surgical History:  Procedure Laterality Date   COLONOSCOPY  02/2016   DJ-MAC-prep good-int hems-79yr recall- no polyps   SHOULDER SURGERY Right 2014   UV joint-collar bone    Family History  Problem Relation Age of Onset   Lymphoma Mother    Cancer Mother        lymphoma   Colon polyps Father 61   Hyperlipidemia Father    Kidney disease Father    Colon cancer Paternal Uncle 57   Depression Maternal Grandmother    Cancer Maternal Grandmother 47       fatal colon   Heart disease Paternal Grandfather    Stomach cancer Neg Hx    Esophageal cancer Neg  Hx    Rectal cancer Neg Hx     Social History   Socioeconomic History   Marital status: Significant Other    Spouse name: Not on file   Number of children: Not on file   Years of education: Not on file   Highest education level: Not on file  Occupational History   Occupation: Golf Pro    Employer: [employer]    Comment: [place]  Tobacco Use   Smoking status: Former    Types: Cigars    Quit date: 07/29/2006    Years since quitting: 15.7   Smokeless tobacco: Never  Vaping Use   Vaping Use: Never used  Substance and Sexual Activity   Alcohol use: Yes    Alcohol/week: 0.0 - 12.0 standard drinks of alcohol    Comment: 3-4 BEERS PER DAY PER PT   Drug use: No   Sexual activity: Yes    Comment: Vasectomy  Other Topics Concern   Not on file  Social History Narrative   He is a golf pro at a [employer]    Social Determinants of Corporate investment banker Strain: Not on file  Food Insecurity: Not on file  Transportation Needs: Not on file  Physical Activity: Not on file  Stress: Not on file  Social Connections: Not on file  Intimate Partner Violence: Not on file    Review of Systems  Constitutional:  Positive for malaise/fatigue. Negative for chills, fever and weight loss.  Respiratory:  Negative for cough and shortness of breath.   Cardiovascular:  Positive for palpitations. Negative for chest pain and leg swelling.  Gastrointestinal:  Negative for abdominal pain, constipation, diarrhea, nausea and vomiting.  Genitourinary:  Positive for frequency. Negative for dysuria and urgency.       Hesitancy   Neurological:  Negative for dizziness, focal weakness and headaches.        Objective    BP 132/84 (BP Location: Left Arm, Patient Position: Sitting, Cuff Size: Large)   Pulse 65   Temp 97.8 F (36.6 C) (Temporal)   Ht 6' (1.829 m)   Wt 241 lb (109.3 kg)   SpO2 97%   BMI 32.69 kg/m   Physical Exam Constitutional:      General: He is not in acute distress.    Appearance: He is not ill-appearing.  HENT:     Mouth/Throat:     Mouth: Mucous membranes are moist.     Pharynx: Oropharynx is clear.  Eyes:     Extraocular Movements: Extraocular movements intact.     Conjunctiva/sclera: Conjunctivae normal.  Cardiovascular:     Rate and Rhythm: Normal rate and regular rhythm.  Pulmonary:     Effort: Pulmonary effort is normal.     Breath sounds: Normal breath sounds.  Musculoskeletal:     Cervical back: Normal range of motion and neck supple.     Right lower leg: No edema.     Left lower leg: No edema.  Skin:    General: Skin is warm and dry.  Neurological:     General: No focal deficit present.     Mental Status: He is alert and oriented to person, place, and time.     Cranial Nerves: No cranial nerve deficit.     Motor: No weakness.     Coordination: Coordination  normal.  Psychiatric:        Mood and Affect: Mood normal.        Behavior: Behavior normal.  Thought Content: Thought content normal.         Assessment & Plan:   Problem List Items Addressed This Visit       Cardiovascular and Mediastinum   Paroxysmal atrial fibrillation (HCC)    Follow up with A-fib clinic.         Respiratory   OSA (obstructive sleep apnea)    Is currently using dental appliance he bought online. States it is helping. CPAP did not work for him.         Endocrine   Hypogonadism in male - Primary    On topical testosterone for the past 2-3 years. Having lower urinary tract symptoms. Referral to urologist.       Relevant Medications   Testosterone 1.62 % GEL   Other Relevant Orders   Comprehensive metabolic panel (Completed)   CBC with Differential/Platelet (Completed)   Testosterone (Completed)   PSA (Completed)   Ambulatory referral to Urology     Other   Fatigue    May be multifactorial. Testosterone therapy has not helped. Check labs to look for underlying etiology.       Relevant Orders   Testosterone (Completed)   VITAMIN D 25 Hydroxy (Vit-D Deficiency, Fractures) (Completed)   TSH (Completed)   T4, free (Completed)   Vitamin B12 (Completed)   Vitamin B1   Lower urinary tract symptoms    Check UA and PSA. Referral to urology.       Relevant Orders   PSA (Completed)   Urinalysis, Routine w reflex microscopic (Completed)   Ambulatory referral to Urology   Mixed hyperlipidemia    Is currently not on statin therapy. Check lipids and follow up.       Relevant Orders   Lipid panel (Completed)   Obesity (BMI 30.0-34.9)    Check labs to look for complications related to obesity. He is motivated to lose weight. Discussed cutting back on foods high in fat, sugar, carbohydrates, etc. Mediterranean diet handout provided.       Prediabetes    Check A1c and follow up      Relevant Orders   Hemoglobin A1c (Completed)    Vitamin D deficiency    Follow up pending vitamin D level       Relevant Orders   VITAMIN D 25 Hydroxy (Vit-D Deficiency, Fractures) (Completed)   Other Visit Diagnoses     Encounter to establish care           Return in about 3 months (around 08/03/2022).   Hetty Blend, NP-C

## 2022-05-04 NOTE — Assessment & Plan Note (Signed)
Is currently using dental appliance he bought online. States it is helping. CPAP did not work for him.

## 2022-05-04 NOTE — Assessment & Plan Note (Signed)
Check UA and PSA. Referral to urology.

## 2022-05-04 NOTE — Progress Notes (Signed)
His vitamin B 12 is low. I recommend once weekly B12 injections x 4 weeks and then monthly going forward if he is willing. Also, his testosterone is a little low but close to normal. Continue his current dose of testosterone. The urologist may want to do something different with his dose or treatment. His LDL is >200 and his triglycerides are >400. He should restart his statin if he is willing. Let me know if there is a reason he does not want to restart. Follow up for a fasting office visit 6 weeks after restarting. Cut back on foods high in saturated fat, fried foods and alcohol to help lower triglycerides. Normal liver and blood sugars.

## 2022-05-04 NOTE — Assessment & Plan Note (Signed)
Check A1c and follow up.  

## 2022-05-04 NOTE — Assessment & Plan Note (Signed)
Follow up with A-fib clinic.

## 2022-05-06 ENCOUNTER — Other Ambulatory Visit: Payer: Self-pay

## 2022-05-06 ENCOUNTER — Ambulatory Visit (INDEPENDENT_AMBULATORY_CARE_PROVIDER_SITE_OTHER): Payer: Commercial Managed Care - PPO

## 2022-05-06 DIAGNOSIS — E782 Mixed hyperlipidemia: Secondary | ICD-10-CM

## 2022-05-06 DIAGNOSIS — E538 Deficiency of other specified B group vitamins: Secondary | ICD-10-CM

## 2022-05-06 MED ORDER — ATORVASTATIN CALCIUM 40 MG PO TABS
40.0000 mg | ORAL_TABLET | Freq: Every day | ORAL | 1 refills | Status: DC
Start: 2022-05-06 — End: 2022-06-08

## 2022-05-06 MED ORDER — CYANOCOBALAMIN 1000 MCG/ML IJ SOLN
1000.0000 ug | Freq: Once | INTRAMUSCULAR | Status: AC
Start: 2022-05-06 — End: 2022-05-06
  Administered 2022-05-06: 1000 ug via INTRAMUSCULAR

## 2022-05-06 NOTE — Progress Notes (Signed)
Pt was given B12 injection with no complications. 

## 2022-05-07 LAB — VITAMIN B1: Vitamin B1 (Thiamine): 8 nmol/L (ref 8–30)

## 2022-05-13 ENCOUNTER — Ambulatory Visit (INDEPENDENT_AMBULATORY_CARE_PROVIDER_SITE_OTHER): Payer: Commercial Managed Care - PPO

## 2022-05-13 DIAGNOSIS — E538 Deficiency of other specified B group vitamins: Secondary | ICD-10-CM | POA: Diagnosis not present

## 2022-05-13 MED ORDER — CYANOCOBALAMIN 1000 MCG/ML IJ SOLN
1000.0000 ug | Freq: Once | INTRAMUSCULAR | Status: AC
Start: 2022-05-13 — End: 2022-05-13
  Administered 2022-05-13: 1000 ug via INTRAMUSCULAR

## 2022-05-13 MED ORDER — CYANOCOBALAMIN 1000 MCG/ML IJ SOLN
1000.0000 ug | Freq: Once | INTRAMUSCULAR | Status: DC
Start: 2022-05-13 — End: 2022-05-13

## 2022-05-13 NOTE — Progress Notes (Signed)
B12 given.  Pt tolerated well. Pt is aware to give the office a call for an side effects or reactions. Please co-sign.   

## 2022-05-20 ENCOUNTER — Ambulatory Visit (INDEPENDENT_AMBULATORY_CARE_PROVIDER_SITE_OTHER): Payer: Commercial Managed Care - PPO | Admitting: *Deleted

## 2022-05-20 DIAGNOSIS — E538 Deficiency of other specified B group vitamins: Secondary | ICD-10-CM

## 2022-05-20 MED ORDER — CYANOCOBALAMIN 1000 MCG/ML IJ SOLN
1000.0000 ug | Freq: Once | INTRAMUSCULAR | Status: AC
Start: 2022-05-20 — End: 2022-05-20
  Administered 2022-05-20: 1000 ug via INTRAMUSCULAR

## 2022-05-20 NOTE — Progress Notes (Signed)
Pls cosign for B12 inj../lmb  

## 2022-05-26 ENCOUNTER — Ambulatory Visit (INDEPENDENT_AMBULATORY_CARE_PROVIDER_SITE_OTHER): Payer: Commercial Managed Care - PPO

## 2022-05-26 DIAGNOSIS — E538 Deficiency of other specified B group vitamins: Secondary | ICD-10-CM

## 2022-05-26 MED ORDER — CYANOCOBALAMIN 1000 MCG/ML IJ SOLN
1000.0000 ug | Freq: Once | INTRAMUSCULAR | Status: AC
Start: 2022-05-26 — End: 2022-05-26
  Administered 2022-05-26: 1000 ug via INTRAMUSCULAR

## 2022-05-26 NOTE — Progress Notes (Signed)
Pt was given B12 injection w/o any complications. 

## 2022-06-08 ENCOUNTER — Telehealth: Payer: Self-pay | Admitting: Family Medicine

## 2022-06-08 DIAGNOSIS — E782 Mixed hyperlipidemia: Secondary | ICD-10-CM

## 2022-06-08 MED ORDER — ATORVASTATIN CALCIUM 40 MG PO TABS
40.0000 mg | ORAL_TABLET | Freq: Every day | ORAL | 0 refills | Status: DC
Start: 2022-06-08 — End: 2022-11-03

## 2022-06-08 NOTE — Telephone Encounter (Signed)
Short supply rx sent to [place] pharmacy

## 2022-06-08 NOTE — Addendum Note (Signed)
Addended by: Marinus Maw on: 06/08/2022 02:29 PM   Modules accepted: Orders

## 2022-06-08 NOTE — Telephone Encounter (Signed)
Patient called and said he is out of town and forgot to pack his atorvastatin (LIPITOR) 40 MG tablet . He would like to know if 10 days worth can be sent to a pharmacy in [place] for him to pick up. If possible, he would like it sent to [facility] at [address] [place], [place] [address]. Best callback is [phone].

## 2022-06-18 ENCOUNTER — Ambulatory Visit: Payer: Commercial Managed Care - PPO | Admitting: Family Medicine

## 2022-06-22 ENCOUNTER — Other Ambulatory Visit (INDEPENDENT_AMBULATORY_CARE_PROVIDER_SITE_OTHER): Payer: Commercial Managed Care - PPO

## 2022-06-22 ENCOUNTER — Ambulatory Visit: Payer: Commercial Managed Care - PPO | Admitting: Family Medicine

## 2022-06-22 ENCOUNTER — Encounter: Payer: Self-pay | Admitting: Family Medicine

## 2022-06-22 VITALS — BP 132/82 | HR 62 | Temp 97.8°F | Ht 72.0 in | Wt 238.0 lb

## 2022-06-22 DIAGNOSIS — E559 Vitamin D deficiency, unspecified: Secondary | ICD-10-CM | POA: Diagnosis not present

## 2022-06-22 DIAGNOSIS — E538 Deficiency of other specified B group vitamins: Secondary | ICD-10-CM | POA: Insufficient documentation

## 2022-06-22 DIAGNOSIS — R7303 Prediabetes: Secondary | ICD-10-CM

## 2022-06-22 DIAGNOSIS — E291 Testicular hypofunction: Secondary | ICD-10-CM

## 2022-06-22 DIAGNOSIS — E782 Mixed hyperlipidemia: Secondary | ICD-10-CM

## 2022-06-22 LAB — COMPREHENSIVE METABOLIC PANEL
ALT: 49 U/L (ref 0–53)
AST: 23 U/L (ref 0–37)
Albumin: 4.3 g/dL (ref 3.5–5.2)
Alkaline Phosphatase: 53 U/L (ref 39–117)
BUN: 12 mg/dL (ref 6–23)
CO2: 28 mEq/L (ref 19–32)
Calcium: 9.4 mg/dL (ref 8.4–10.5)
Chloride: 103 mEq/L (ref 96–112)
Creatinine, Ser: 0.81 mg/dL (ref 0.40–1.50)
GFR: 102.32 mL/min (ref >60.00)
Glucose, Bld: 113 mg/dL — ABNORMAL HIGH (ref 70–99)
Potassium: 4.2 mEq/L (ref 3.5–5.1)
Sodium: 137 mEq/L (ref 135–145)
Total Bilirubin: 0.7 mg/dL (ref 0.2–1.2)
Total Protein: 7.3 g/dL (ref 6.0–8.3)

## 2022-06-22 LAB — CBC WITH DIFFERENTIAL/PLATELET
Basophils Absolute: 0 10*3/uL (ref 0.0–0.1)
Basophils Relative: 0.3 % (ref 0.0–3.0)
Eosinophils Absolute: 0.2 10*3/uL (ref 0.0–0.7)
Eosinophils Relative: 3.5 % (ref 0.0–5.0)
HCT: 46.9 % (ref 39.0–52.0)
Hemoglobin: 15.7 g/dL (ref 13.0–17.0)
Lymphocytes Relative: 32.8 % (ref 12.0–46.0)
Lymphs Abs: 1.8 10*3/uL (ref 0.7–4.0)
MCHC: 33.5 g/dL (ref 30.0–36.0)
MCV: 97.7 fl (ref 78.0–100.0)
Monocytes Absolute: 0.6 10*3/uL (ref 0.1–1.0)
Monocytes Relative: 11.1 % (ref 3.0–12.0)
Neutro Abs: 2.8 10*3/uL (ref 1.4–7.7)
Neutrophils Relative %: 52.3 % (ref 43.0–77.0)
Platelets: 142 10*3/uL — ABNORMAL LOW (ref 150.0–400.0)
RBC: 4.8 Mil/uL (ref 4.22–5.81)
RDW: 12.9 % (ref 11.5–15.5)
WBC: 5.4 10*3/uL (ref 4.0–10.5)

## 2022-06-22 LAB — TESTOSTERONE: Testosterone: 217.03 ng/dL — ABNORMAL LOW (ref 300.00–890.00)

## 2022-06-22 LAB — LIPID PANEL
Cholesterol: 196 mg/dL (ref 0–200)
HDL: 66.8 mg/dL (ref >39.00)
NonHDL: 129.57
Total CHOL/HDL Ratio: 3
Triglycerides: 275 mg/dL — ABNORMAL HIGH (ref 0.0–149.0)
VLDL: 55 mg/dL — ABNORMAL HIGH (ref 0.0–40.0)

## 2022-06-22 LAB — LDL CHOLESTEROL, DIRECT: Direct LDL: 97 mg/dL

## 2022-06-22 LAB — VITAMIN B12: Vitamin B-12: 330 pg/mL (ref 211–911)

## 2022-06-22 MED ORDER — TESTOSTERONE 1.62 % TD GEL
TRANSDERMAL | 1 refills | Status: DC
Start: 2022-06-22 — End: 2022-06-30

## 2022-06-22 NOTE — Assessment & Plan Note (Signed)
Recommend lower carbohydrate diet and increasing physical activity.

## 2022-06-22 NOTE — Progress Notes (Signed)
Subjective:     Patient ID: Michael Kim, male    DOB: Jan 21, 1970, 52 y.o.   MRN: [id number]  Chief Complaint  Patient presents with   Hyperlipidemia    6 week f/u, fasting    HPI  Discussed the use of AI scribe software for clinical note transcription with the patient, who gave verbal consent to proceed.  History of Present Illness         Here for f/u on recent abnormal labs and chronic health conditions.  Taking atorvastatin, vitamin D and MVI at lunch.   Both of his parents passed in the past few months. Reports managing ok.   Job is more sedentary. He has lost 4 lbs since his last visit.  He and his wife are cooking [employer] at home.  Reports eating a lot of pizza. Diet is generally poor and is not active.   He saw urology. Will follow up regarding BPH and urinary symptoms. He will also address testosterone therapy.   Health Maintenance Due  Topic Date Due   HIV Screening  Never done   Hepatitis C Screening  Never done   Zoster Vaccines- Shingrix (1 of 2) Never done    Past Medical History:  Diagnosis Date   Atrial fibrillation with rapid ventricular response (HCC)    GERD (gastroesophageal reflux disease)    Hyperlipidemia    on meds   Other testicular hypofunction    Sleep apnea    mouthpiece used nightly   Unspecified vitamin D deficiency     Past Surgical History:  Procedure Laterality Date   COLONOSCOPY  02/2016   DJ-MAC-prep good-int hems-57yr recall- no polyps   SHOULDER SURGERY Right 2014   UV joint-collar bone    Family History  Problem Relation Age of Onset   Lymphoma Mother    Cancer Mother        lymphoma   Colon polyps Father 60   Hyperlipidemia Father    Kidney disease Father    Colon cancer Paternal Uncle 55   Depression Maternal Grandmother    Cancer Maternal Grandmother 47       fatal colon   Heart disease Paternal Grandfather    Stomach cancer Neg Hx    Esophageal cancer Neg Hx    Rectal cancer Neg Hx     Social  History   Socioeconomic History   Marital status: Significant Other    Spouse name: Not on file   Number of children: Not on file   Years of education: Not on file   Highest education level: Not on file  Occupational History   Occupation: Golf Pro    Employer: [employer]    Comment: [place]  Tobacco Use   Smoking status: Former    Types: Cigars    Quit date: 07/29/2006    Years since quitting: 15.9   Smokeless tobacco: Never  Vaping Use   Vaping Use: Never used  Substance and Sexual Activity   Alcohol use: Yes    Alcohol/week: 0.0 - 12.0 standard drinks of alcohol    Comment: 3-4 BEERS PER DAY PER PT   Drug use: No   Sexual activity: Yes    Comment: Vasectomy  Other Topics Concern   Not on file  Social History Narrative   He is a golf pro at a [employer]   Social Determinants of Corporate investment banker Strain: Not on file  Food Insecurity: Not on file  Transportation Needs: Not on  file  Physical Activity: Not on file  Stress: Not on file  Social Connections: Not on file  Intimate Partner Violence: Not on file    Outpatient Medications Prior to Visit  Medication Sig Dispense Refill   atorvastatin (LIPITOR) 40 MG tablet Take 1 tablet (40 mg total) by mouth daily. 10 tablet 0   Cholecalciferol (VITAMIN D) 125 MCG (5000 UT) CAPS Take 1 capsule by mouth 2 (two) times daily.     cyclobenzaprine (FLEXERIL) 10 MG tablet Take 10 mg by mouth 3 (three) times daily as needed for muscle spasms.     diltiazem (CARDIZEM) 30 MG tablet Take 1 tablet every 4 hours AS NEEDED for heart rate >100 30 tablet 1   Psyllium (METAMUCIL FIBER PO) Take 1 Dose by mouth daily.     tadalafil (CIALIS) 20 MG tablet Take 1/2 to 1 tablet every 2 to 3 days as needed for [name] 30 tablet 0   valACYclovir (VALTREX) 500 MG tablet Take 1 tablet (500 mg total) by mouth 2 (two) times daily as needed. 30 tablet 0   Testosterone 1.62 % GEL APPLY TWO PUMPS EXTERNALLY ON EACH ARM ONCE DAILY. 450 g 0    No facility-administered medications prior to visit.    Allergies  Allergen Reactions   Pravachol [Pravastatin] Other (See Comments)    Fatigue   Rosuvastatin Other (See Comments)    "Feel strange"    Review of Systems  Constitutional:  Negative for chills and fever.  Respiratory:  Negative for shortness of breath.   Cardiovascular:  Negative for chest pain and palpitations.  Gastrointestinal:  Negative for abdominal pain, constipation, diarrhea, nausea and vomiting.  Genitourinary:  Negative for dysuria, frequency and urgency.  Neurological:  Negative for dizziness.       Objective:    Physical Exam Constitutional:      General: He is not in acute distress.    Appearance: He is not ill-appearing.  Eyes:     Extraocular Movements: Extraocular movements intact.     Conjunctiva/sclera: Conjunctivae normal.  Cardiovascular:     Rate and Rhythm: Normal rate.  Pulmonary:     Effort: Pulmonary effort is normal.  Musculoskeletal:     Cervical back: Normal range of motion.  Skin:    General: Skin is warm and dry.  Neurological:     General: No focal deficit present.     Mental Status: He is alert and oriented to person, place, and time.  Psychiatric:        Mood and Affect: Mood normal.        Behavior: Behavior normal.        Thought Content: Thought content normal.      BP 132/82 (BP Location: Left Arm, Patient Position: Sitting, Cuff Size: Large)   Pulse 62   Temp 97.8 F (36.6 C) (Temporal)   Ht 6' (1.829 m)   Wt 238 lb (108 kg)   SpO2 95%   BMI 32.28 kg/m  Wt Readings from Last 3 Encounters:  06/22/22 238 lb (108 kg)  05/04/22 241 lb (109.3 kg)  03/02/22 225 lb (102.1 kg)       Assessment & Plan:   Problem List Items Addressed This Visit       Endocrine   Hypogonadism in male    On topical testosterone for the past 2-3 years. Having lower urinary tract symptoms.  Refilled medication. Recheck testosterone, previously low. PSA trending higher.  Referral to urologist.  Relevant Medications   Testosterone 1.62 % GEL   Other Relevant Orders   CBC with Differential/Platelet (Completed)   Comprehensive metabolic panel (Completed)     Other   B12 deficiency    Completed 4 wks of B12 injections and no taking OTC MVI      Relevant Orders   Vitamin B12 (Completed)   Mixed hyperlipidemia - Primary    LDL >200, started on atorvastatin.  Recheck fasting lipids and liver function.  Aware of importance of eating a cleaner, low fat diet and increasing physical activity.  Follow up.       Relevant Orders   Lipid panel (Completed)   Comprehensive metabolic panel (Completed)   Prediabetes    Recommend lower carbohydrate diet and increasing physical activity.       Relevant Orders   CBC with Differential/Platelet (Completed)   Comprehensive metabolic panel (Completed)   Vitamin D deficiency   Continue MVI  I am having Michael Kim. Michael Kim maintain his Psyllium (METAMUCIL FIBER PO), tadalafil, Vitamin D, diltiazem, valACYclovir, cyclobenzaprine, atorvastatin, and Testosterone.  Meds ordered this encounter  Medications   Testosterone 1.62 % GEL    Sig: APPLY TWO PUMPS EXTERNALLY ON EACH ARM ONCE DAILY.    Dispense:  450 g    Refill:  1

## 2022-06-22 NOTE — Assessment & Plan Note (Signed)
On topical testosterone for the past 2-3 years. Having lower urinary tract symptoms.  Refilled medication. Recheck testosterone, previously low. PSA trending higher. Referral to urologist.

## 2022-06-22 NOTE — Patient Instructions (Addendum)
Please go downstairs for labs.   Continue working on [employer] and exercise.   Follow up with your urologist.    Cholesterol Content in Foods Cholesterol is a waxy, fat-like substance that helps to carry fat in the blood. The body needs cholesterol in small amounts, but too much cholesterol can cause damage to the arteries and heart. What foods have cholesterol?  Cholesterol is found in animal-based foods, such as meat, seafood, and dairy. Generally, low-fat dairy and lean meats have less cholesterol than full-fat dairy and fatty meats. The milligrams of cholesterol per serving (mg per serving) of common cholesterol-containing foods are listed below. Meats and other proteins Egg -- one large whole egg has 186 mg. Veal shank -- 4 oz (113 g) has 141 mg. Lean ground [place] (93% lean) -- 4 oz (113 g) has 118 mg. Fat-trimmed lamb loin -- 4 oz (113 g) has 106 mg. Lean ground beef (90% lean) -- 4 oz (113 g) has 100 mg. Lobster -- 3.5 oz (99 g) has 90 mg. Pork loin chops -- 4 oz (113 g) has 86 mg. Canned salmon -- 3.5 oz (99 g) has 83 mg. Fat-trimmed beef top loin -- 4 oz (113 g) has 78 mg. Frankfurter -- 1 frank (3.5 oz or 99 g) has 77 mg. Crab -- 3.5 oz (99 g) has 71 mg. Roasted chicken without skin, white meat -- 4 oz (113 g) has 66 mg. Light bologna -- 2 oz (57 g) has 45 mg. Deli-cut [place] -- 2 oz (57 g) has 31 mg. Canned tuna -- 3.5 oz (99 g) has 31 mg. Tomasa Blase -- 1 oz (28 g) has 29 mg. Oysters and mussels (raw) -- 3.5 oz (99 g) has 25 mg. Mackerel -- 1 oz (28 g) has 22 mg. Trout -- 1 oz (28 g) has 20 mg. Pork sausage -- 1 link (1 oz or 28 g) has 17 mg. Salmon -- 1 oz (28 g) has 16 mg. Tilapia -- 1 oz (28 g) has 14 mg. Dairy Soft-serve ice cream --  cup (4 oz or 86 g) has 103 mg. Whole-milk yogurt -- 1 cup (8 oz or 245 g) has 29 mg. Cheddar cheese -- 1 oz (28 g) has 28 mg. American cheese -- 1 oz (28 g) has 28 mg. Whole milk -- 1 cup (8 oz or 250 mL) has 23 mg. 2% milk -- 1  cup (8 oz or 250 mL) has 18 mg. Cream cheese -- 1 tablespoon (Tbsp) (14.5 g) has 15 mg. Cottage cheese --  cup (4 oz or 113 g) has 14 mg. Low-fat (1%) milk -- 1 cup (8 oz or 250 mL) has 10 mg. Sour cream -- 1 Tbsp (12 g) has 8.5 mg. Low-fat yogurt -- 1 cup (8 oz or 245 g) has 8 mg. Nonfat Greek yogurt -- 1 cup (8 oz or 228 g) has 7 mg. Half-and-half cream -- 1 Tbsp (15 mL) has 5 mg. Fats and oils Cod liver oil -- 1 tablespoon (Tbsp) (13.6 g) has 82 mg. Butter -- 1 Tbsp (14 g) has 15 mg. Lard -- 1 Tbsp (12.8 g) has 14 mg. Bacon grease -- 1 Tbsp (12.9 g) has 14 mg. Mayonnaise -- 1 Tbsp (13.8 g) has 5-10 mg. Margarine -- 1 Tbsp (14 g) has 3-10 mg. The items listed above may not be a complete list of foods with cholesterol. Exact amounts of cholesterol in these foods may vary depending on specific ingredients and brands. Contact a dietitian for  more information. What foods do not have cholesterol? Most plant-based foods do not have cholesterol unless you combine them with a food that has cholesterol. Foods without cholesterol include: Grains and cereals. Vegetables. Fruits. Vegetable oils, such as olive, canola, and sunflower oil. Legumes, such as peas, beans, and lentils. Nuts and seeds. Egg whites. The items listed above may not be a complete list of foods that do not have cholesterol. Contact a dietitian for more information. Summary The body needs cholesterol in small amounts, but too much cholesterol can cause damage to the arteries and heart. Cholesterol is found in animal-based foods, such as meat, seafood, and dairy. Generally, low-fat dairy and lean meats have less cholesterol than full-fat dairy and fatty meats. This information is not intended to replace advice given to you by your health care provider. Make sure you discuss any questions you have with your health care provider. Document Revised: 05/02/2020 Document Reviewed: 05/02/2020 Elsevier Patient Education  2024  [employer].

## 2022-06-22 NOTE — Assessment & Plan Note (Signed)
LDL >200, started on atorvastatin.  Recheck fasting lipids and liver function.  Aware of importance of eating a cleaner, low fat diet and increasing physical activity.  Follow up.

## 2022-06-22 NOTE — Assessment & Plan Note (Signed)
Completed 4 wks of B12 injections and no taking OTC MVI

## 2022-06-30 ENCOUNTER — Other Ambulatory Visit: Payer: Self-pay | Admitting: Family Medicine

## 2022-06-30 ENCOUNTER — Encounter: Payer: Self-pay | Admitting: Family Medicine

## 2022-06-30 DIAGNOSIS — E291 Testicular hypofunction: Secondary | ICD-10-CM

## 2022-06-30 MED ORDER — TESTOSTERONE 1.62 % TD GEL
TRANSDERMAL | 1 refills | Status: DC
Start: 2022-06-30 — End: 2022-07-16

## 2022-07-16 ENCOUNTER — Other Ambulatory Visit: Payer: Self-pay | Admitting: Family Medicine

## 2022-07-16 DIAGNOSIS — E291 Testicular hypofunction: Secondary | ICD-10-CM

## 2022-07-16 MED ORDER — TESTOSTERONE 1.62 % TD GEL: TRANSDERMAL | 0 refills | Status: DC

## 2022-07-16 NOTE — Progress Notes (Signed)
   Subjective:    Patient ID: Michael Kim, male    DOB: Dec 25, 1970, 52 y.o.   MRN: [id number]  HPI    Review of Systems     Objective:   Physical Exam        Assessment & Plan:

## 2022-09-17 ENCOUNTER — Other Ambulatory Visit: Payer: Self-pay

## 2022-09-17 ENCOUNTER — Telehealth: Payer: Self-pay | Admitting: Cardiovascular Disease

## 2022-09-17 MED ORDER — DILTIAZEM HCL 30 MG PO TABS: ORAL_TABLET | ORAL | 1 refills | Status: DC

## 2022-09-17 NOTE — Telephone Encounter (Signed)
*  STAT* If patient is at the pharmacy, call can be transferred to refill team.   1. Which medications need to be refilled? (please list name of each medication and dose if known)   diltiazem (CARDIZEM) 30 MG tablet  apixaban (ELIQUIS) 5 MG TABS tablet   2. Would you like to learn more about the convenience, safety, & potential cost savings by using the [facility]?   3. Are you open to using the [facility] (Type [facility]. ).  4. Which pharmacy/location (including street and city if local pharmacy) is medication to be sent to?  [id number] - [place], [place] - [address]   5. Do they need a 30 day or 90 day supply?   30 day  Patient stated he is completely out of these medications.  Patient has appointment scheduled on 10/29.

## 2022-09-17 NOTE — Telephone Encounter (Signed)
Prescription refill request for Eliquis received. Indication: Last office visit: Scr: Age:  Weight:

## 2022-09-17 NOTE — Telephone Encounter (Signed)
Pt is requesting a refill on Eliquis. This medication is no longer on pt's medication list. Please address

## 2022-09-20 NOTE — Telephone Encounter (Signed)
Called and spoke with patient who states he's not had any new episodes of AF, but that it was discussed for him to have on hand and should he go back into A-fib, states he was told to immediately take an Eliquis to prevent stroke. Already scheduled with Tereso Newcomer, PA on 11/02/22. Requesting refill to have on hand. Will route back to MD for final word on refill. Per OV note on 08/17/21: At this time I do not think he needs to restart the Eliquis.  He takes Eliquis and diltiazem if he develops an episode of A-fib.  I think that is a fine strategy at this time since his CHADS2VASC score is 0.

## 2022-09-21 MED ORDER — APIXABAN 5 MG PO TABS: 5.0000 mg | ORAL_TABLET | Freq: Two times a day (BID) | ORAL | 0 refills | Status: DC

## 2022-09-21 NOTE — Telephone Encounter (Signed)
Nahser, Michael Ping, MD  Lars Mage, RN Caller: Unspecified (4 days ago,  4:29 PM) Agree with the eliquis and Dilt for recurrent Afib. Agree with appt with scott Alben Spittle PN   Refill for Eliquis sent to pharmacy on file. Called and made pt aware.

## 2022-10-13 ENCOUNTER — Ambulatory Visit: Payer: Commercial Managed Care - PPO | Admitting: Family Medicine

## 2022-10-27 ENCOUNTER — Encounter: Payer: Commercial Managed Care - PPO | Admitting: Nurse Practitioner

## 2022-10-31 NOTE — Progress Notes (Unsigned)
Cardiology Office Note:    Date:  11/02/2022  ID:  Michael Kim, DOB 06/28/1970, MRN [id number] PCP: Avanell Shackleton, NP-C  [facility] Providers Cardiologist:  Kristeen Miss, MD Sleep Medicine:  Armanda Magic, MD       Patient Profile:      Paroxysmal atrial fibrillation  TTE 05/07/13: mild LVH, EF 60-65, no RWMA, mild RAE  CAC score 07/30/20: 0 Hyperlipidemia   OSA         History of Present Illness:  Discussed the use of AI scribe software for clinical note transcription with the patient, who gave verbal consent to proceed.  Michael Kim is a 52 y.o. male who returns for follow up of atrial fibrillation. He was last seen by Dr. Elease Hashimoto in 08/2021. He is here alone. He reports having had three episodes of atrial fibrillation over the past ten years, each approximately three years apart. The episodes are described as noticeable, with the sensation of a bird flying around in the chest, difficulty focusing, and slowed speech. The patient has a prescription for Eliquis and diltiazem, which he keeps on hand to take immediately if another episode occurs, but he does not take these medications daily. He initially used a CPAP for OSA, which he found beneficial for the first six months, but subsequently had difficulty tolerating it. He now uses a mouthpiece that has improved his sleep significantly. He has been experiencing a recurring dream in which he is late and stressed. He awakens with palpitations. The patient has also reported occasional, fleeting chest discomfort, rated as a 2 or 3 out of 10, which has occurred approximately five times over the past six months. The discomfort does not appear to be exertional, and there are no associated symptoms such as jaw or arm pain, shortness of breath, or syncope. The patient does not exercise regularly, but reports no chest discomfort during walking. He reports feeling somewhat run down on Atorvastatin, and has previously tried other statins with  similar side effects.      Review of Systems  Cardiovascular:  Negative for claudication.  See HPI     Studies Reviewed:   EKG Interpretation Date/Time:  Tuesday November 02 2022 08:27:11 EDT Ventricular Rate:  65 PR Interval:  154 QRS Duration:  78 QT Interval:  376 QTC Calculation: 391 R Axis:   34  Text Interpretation: Normal sinus rhythm Normal ECG Confirmed by Tereso Newcomer [phone] on 11/02/2022 8:31:18 AM    Results   LABS - Chart Review Potassium: 4.2 mmol/L (06/22/2022) Creatinine: 0.81 mg/dL (47/42/5956) ALT: 49 U/L (06/22/2022) Total Cholesterol: 196 mg/dL (38/75/6433) LDL: 97 mg/dL (29/51/8841) HDL: 66.0 mg/dL (63/01/6008) Triglycerides: 275 mg/dL (93/23/5573) Hemoglobin: 15.7 g/dL (22/02/5425) TSH: 0.62 mIU/L (05/04/2022)     Risk Assessment/Calculations:    CHA2DS2-VASc Score = 0   This indicates a 0.2% annual risk of stroke. The patient's score is based upon: CHF History: 0 HTN History: 0 Diabetes History: 0 Stroke History: 0 Vascular Disease History: 0 Age Score: 0 Gender Score: 0         STOP-Bang Score:         Physical Exam:   VS:  BP 124/82   Pulse 65   Ht 6' (1.829 m)   Wt 232 lb (105.2 kg)   SpO2 97%   BMI 31.46 kg/m    Wt Readings from Last 3 Encounters:  11/02/22 232 lb (105.2 kg)  06/22/22 238 lb (108 kg)  05/04/22 241 lb (109.3 kg)  Constitutional:      Appearance: Healthy appearance. Not in distress.  Neck:     Vascular: No carotid bruit. JVD normal.  Pulmonary:     Breath sounds: Normal breath sounds. No wheezing. No rales.  Cardiovascular:     Normal rate. Regular rhythm.     Murmurs: There is no murmur.  Edema:    Peripheral edema absent.  Abdominal:     Palpations: Abdomen is soft.        Assessment and Plan:   Assessment & Plan Paroxysmal atrial fibrillation (HCC) Notes from Dr. Elease Hashimoto reviewed. The pt had episodes of AFib related to poorly controlled OSA. At last visit, he had been using a mouthguard  with much better controlled OSA. He had not had any further episodes of AFib. His CHA2DS2-VASc Score = 0 [CHF History: 0, HTN History: 0, Diabetes History: 0, Stroke History: 0, Vascular Disease History: 0, Age Score: 0, Gender Score: 0].  Therefore, the patient's annual risk of stroke is 0.2 %. Therefore, it was felt that he did not have to remain on long term anticoagulation with Apixaban. The strategy has been to continue Diltiazme and Apixaban as needed for recurrent AFib. He notes infrequent episodes with the last episode several years ago. He just purchased an Apple watch which has AFib detection.  -Continue current management plan with Diltiazem 30 mg every 4 hours as needed along with Eliquis 5 mg twice daily if AFib recurs. Palpitations He notes recent palpitations at night assoc with a stressful dream. We discussed watchful waiting vs Zio patch monitor. He prefers the latter.  -Arrange Zio XT x 14 days Mixed hyperlipidemia Elevated cholesterol and triglycerides despite Atorvastatin use. Patient reports intolerance to Atorvastatin and Crestor. -Refer to Lipid clinic to initiate PCSK9 inhibitor (Repatha or Praluent)  OSA (obstructive sleep apnea) Managed with over-the-counter oral appliance. No recent episodes of Atrial Fibrillation since starting treatment. -Continue current management with oral appliance Precordial chest pain Noncardiac chest pain. Infrequent, fleeting chest discomfort, not associated with exertion. No other cardiac symptoms. Normal EKG and zero calcium score 2 years ago. -Continue monitoring symptoms. Consider stress test if symptoms become more consistent or worrisome. -Consider repeat calcium score in 1 year (2025) as per guidelines.       Dispo:  Return in about 1 year (around 11/02/2023) for Routine Follow Up, w/ Dr. Elease Hashimoto, or Tereso Newcomer, PA-C.  Signed, Tereso Newcomer, PA-C

## 2022-11-02 ENCOUNTER — Ambulatory Visit: Payer: Commercial Managed Care - PPO | Attending: Physician Assistant | Admitting: Physician Assistant

## 2022-11-02 ENCOUNTER — Ambulatory Visit (INDEPENDENT_AMBULATORY_CARE_PROVIDER_SITE_OTHER): Payer: Commercial Managed Care - PPO

## 2022-11-02 ENCOUNTER — Encounter: Payer: Self-pay | Admitting: Physician Assistant

## 2022-11-02 VITALS — BP 124/82 | HR 65 | Ht 72.0 in | Wt 232.0 lb

## 2022-11-02 DIAGNOSIS — R002 Palpitations: Secondary | ICD-10-CM

## 2022-11-02 DIAGNOSIS — I480 Paroxysmal atrial fibrillation: Secondary | ICD-10-CM | POA: Diagnosis not present

## 2022-11-02 DIAGNOSIS — E782 Mixed hyperlipidemia: Secondary | ICD-10-CM

## 2022-11-02 DIAGNOSIS — G4733 Obstructive sleep apnea (adult) (pediatric): Secondary | ICD-10-CM

## 2022-11-02 DIAGNOSIS — R072 Precordial pain: Secondary | ICD-10-CM

## 2022-11-02 NOTE — Progress Notes (Unsigned)
Enrolled patient for a 14 day Zio XT monitor to be mailed to patients home  Nahser to read

## 2022-11-02 NOTE — Assessment & Plan Note (Addendum)
Notes from Dr. Elease Hashimoto reviewed. The pt had episodes of AFib related to poorly controlled OSA. At last visit, he had been using a mouthguard with much better controlled OSA. He had not had any further episodes of AFib. His CHA2DS2-VASc Score = 0 [CHF History: 0, HTN History: 0, Diabetes History: 0, Stroke History: 0, Vascular Disease History: 0, Age Score: 0, Gender Score: 0].  Therefore, the patient's annual risk of stroke is 0.2 %. Therefore, it was felt that he did not have to remain on long term anticoagulation with Apixaban. The strategy has been to continue Diltiazme and Apixaban as needed for recurrent AFib. He notes infrequent episodes with the last episode several years ago. He just purchased an Apple watch which has AFib detection.  -Continue current management plan with Diltiazem 30 mg every 4 hours as needed along with Eliquis 5 mg twice daily if AFib recurs.

## 2022-11-02 NOTE — Assessment & Plan Note (Signed)
Elevated cholesterol and triglycerides despite Atorvastatin use. Patient reports intolerance to Atorvastatin and Crestor. -Refer to Lipid clinic to initiate PCSK9 inhibitor (Repatha or Praluent)

## 2022-11-02 NOTE — Assessment & Plan Note (Signed)
Managed with over-the-counter oral appliance. No recent episodes of Atrial Fibrillation since starting treatment. -Continue current management with oral appliance

## 2022-11-02 NOTE — Patient Instructions (Addendum)
Medication Instructions:  Your physician recommends that you continue on your current medications as directed. Please refer to the Current Medication list given to you today.  *If you need a refill on your cardiac medications before your next appointment, please call your pharmacy*   Lab Work: None ordered  If you have labs (blood work) drawn today and your tests are completely normal, you will receive your results only by: MyChart Message (if you have MyChart) OR A paper copy in the mail If you have any lab test that is abnormal or we need to change your treatment, we will call you to review the results.   Testing/Procedures: Christena Deem- Long Term Monitor Instructions  Your physician has requested you wear a ZIO patch monitor for 14 days.  This is a single patch monitor. [employer] supplies one patch monitor per enrollment. Additional stickers are not available. Please do not apply patch if you will be having a Nuclear Stress Test,  Echocardiogram, Cardiac CT, MRI, or Chest Xray during the period you would be wearing the  monitor. The patch cannot be worn during these tests. You cannot remove and re-apply the  ZIO XT patch monitor.  Your ZIO patch monitor will be mailed 3 day USPS to your address on file. It may take 3-5 days  to receive your monitor after you have been enrolled.  Once you have received your monitor, please review the enclosed instructions. Your monitor  has already been registered assigning a specific monitor serial # to you.  Billing and Patient Assistance Program Information  We have supplied [employer] with any of your insurance information on file for billing purposes. [employer] offers a sliding scale Patient Assistance Program for patients that do not have  insurance, or whose insurance does not completely cover the cost of the ZIO monitor.  You must apply for the Patient Assistance Program to qualify for this discounted rate.  To apply, please call [employer] at  [phone], select option 4, select option 2, ask to apply for  Patient Assistance Program. Meredeth Ide will ask your household income, and how many people  are in your household. They will quote your out-of-pocket cost based on that information.  [employer] will also be able to set up a 57-month, interest-free payment plan if needed.  Applying the monitor   Shave hair from upper left chest.  Hold abrader disc by orange tab. Rub abrader in 40 strokes over the upper left chest as  indicated in your monitor instructions.  Clean area with 4 enclosed alcohol pads. Let dry.  Apply patch as indicated in monitor instructions. Patch will be placed under collarbone on left  side of chest with arrow pointing upward.  Rub patch adhesive wings for 2 minutes. Remove white label marked "1". Remove the white  label marked "2". Rub patch adhesive wings for 2 additional minutes.  While looking in a mirror, press and release button in center of patch. A small green light will  flash 3-4 times. This will be your only indicator that the monitor has been turned on.  Do not shower for the first 24 hours. You may shower after the first 24 hours.  Press the button if you feel a symptom. You will hear a small click. Record Date, Time and  Symptom in the Patient Logbook.  When you are ready to remove the patch, follow instructions on the last 2 pages of Patient  Logbook. Stick patch monitor onto the last page of Patient Logbook.  Place Patient  Logbook in the blue and white box. Use locking tab on box and tape box closed  securely. The blue and white box has prepaid postage on it. Please place it in the mailbox as  soon as possible. Your physician should have your test results approximately 7 days after the  monitor has been mailed back to [facility].  Call [facility] Customer Care at [phone] if you have questions regarding  your ZIO XT patch monitor. Call them immediately if you see an orange light  blinking on your  monitor.  If your monitor falls off in less than 4 days, contact our Monitor department at [phone].  If your monitor becomes loose or falls off after 4 days call [employer] at [phone] for  suggestions on securing your monitor   You have been referred to [facility] to discuss PCSK9 inhibitor for Cholesterol   Follow-Up: At [facility], you and your health needs are our priority.  As part of our continuing mission to provide you with exceptional heart care, we have created designated Provider Care Teams.  These Care Teams include your primary Cardiologist (physician) and Advanced Practice Providers (APPs -  Physician Assistants and Nurse Practitioners) who all work together to provide you with the care you need, when you need it.  We recommend signing up for the patient portal called "MyChart".  Sign up information is provided on this After Visit Summary.  MyChart is used to connect with patients for Virtual Visits (Telemedicine).  Patients are able to view lab/test results, encounter notes, upcoming appointments, etc.  Non-urgent messages can be sent to your provider as well.   To learn more about what you can do with MyChart, go to [email or web address].    Your next appointment:   12 month(s)  Provider:   Kristeen Miss, MD     Other Instructions

## 2022-11-03 ENCOUNTER — Telehealth: Payer: Self-pay | Admitting: Pharmacist

## 2022-11-03 ENCOUNTER — Other Ambulatory Visit (HOSPITAL_COMMUNITY): Payer: Self-pay

## 2022-11-03 ENCOUNTER — Telehealth: Payer: Self-pay | Admitting: Pharmacy Technician

## 2022-11-03 ENCOUNTER — Encounter: Payer: Self-pay | Admitting: Student

## 2022-11-03 ENCOUNTER — Ambulatory Visit: Payer: Commercial Managed Care - PPO | Attending: Cardiovascular Disease | Admitting: Student

## 2022-11-03 DIAGNOSIS — E782 Mixed hyperlipidemia: Secondary | ICD-10-CM

## 2022-11-03 MED ORDER — ATORVASTATIN CALCIUM 20 MG PO TABS
20.0000 mg | ORAL_TABLET | Freq: Every day | ORAL | 3 refills | Status: DC
Start: 2022-11-03 — End: 2023-02-23
  Filled 2022-11-03: qty 90, 90d supply, fill #0

## 2022-11-03 NOTE — Patient Instructions (Addendum)
Meal Planning:   Managing your diet is important. Meal planning is the key to setting you up for success. Cooking and eating more often at home have many health benefits. It helps you increase your intake of whole fresh foods, allows for more variety and therefore nutrients to be featured in your meals.    Here are some examples of healthy meal options.   Breakfast     Option 1:  Omelette with vegetables (1 egg, spinach, mushrooms, or other vegetable of your choice), 2 slices whole-grain toast, tip of thumb size butter or soft margarine,  cup low-fat milk or yogurt    Option 2: steel-cut rolled oats (? cup dry), 1 tbsp peanut butter added to cooked oats,  cup low-fat milk.   Option 3: 2 slices whole-grain or rye toast with avocado spread ( small avocado mased with herbs and pepper to taste), 1 poached egg or sunnyside up (cooked to your liking)   Option 4:  cup plain 0% [place] yogurt topped with  cup berries and  cup walnuts or almonds, 2 slices whole-grain or rye toast, tip of thumb size soft margarine/butter   Lunch:    Option 1: 2 cups red lentil soup, green salad with 1 tbsp homemade vinaigrette (extra virgin olive oil and vinegar of choice plus spices)   Option 2: 3 oz. roasted chicken, 2 slices whole-grain bread, 2 tsp mayonnaise, mustard, lettuce, tomato if desired, 1 fruit (example: medium-sized apple or small pear)   Option 3: 3 oz. tuna packed in water, 1 whole-wheat pita (6 inch), 2 tsp mayonnaise, lettuce, tomato, or other non-starchy vegetable of your choice, 1 fruit (example: medium-sized apple or small pear)   Option 4: 1 serving of garden veggie buddha bowl with lentils and tahini sauce and 1 cup berries topped with  cup plain 0% Greek yogurt   Dinner:    Option 1: 1 serving roasted cauliflower salad, 3-4 oz.  grilled or baked pork loin chop, 1/2 cup mashed potato, or brown rice or quinoa    Option 2: 1 serving fish (baked, grilled or air fried), green salad, 1  tbsp homemade vinaigrette,  cup cooked couscous   Option 3: 1 cup cooked whole grained pasta (example: spaghetti, spirals, macaroni),  cup favorite pasta sauce (preferably homemade), 3-4 oz.  grilled or baked chicken, green salad, 1 tbsp homemade vinaigrette   Option 4: 1 serving oven roasted salmon,  cup mashed sweet potato or couscous or brown rice or quinoa, broccoli (steamed or roasted)         Healthy snacks:    Carrots or celery with 1 tbsp of hummus    1 medium-sized fruit (apple or orange)   Quarter serving of Fruit and Feta Salad   1 cup plain 0% [place] yogurt with  cup berries   1 slice whole-wheat toast with peanut butter   Half apple, sliced, with 1 tbsp (15 mL) peanut or almond butter   5-6 whole grain crackers and 2 tbsp hummus or 1 tbsp peanut or almond butter

## 2022-11-03 NOTE — Assessment & Plan Note (Addendum)
Assessment:  LDL goal: < 70 mg/dl last LDLc  97 mg/dl (21/3086) Intolerance to high intensity statins - Crestor -mood swing and persistent tiredness, Lipitor 40 mg, pravastatin  - persistent tiredness Discussed next potential options (ezetimibe,PCSK-9 inhibitors, bempedoic acid and inclisiran); cost, dosing efficacy, side effects  Need to lower carb intake to improve TG and start doing regular exercise   Plan: Lower dose atorvastatin to 20 mg daily if not tolerated reduce dose to 10 mg daily or every other day  LDLc still above the goal on high intensity statin and with dose reduction, to improve tolerability,will loose some LDLc and TG  lowering efficacy; reasonable to consider PCSK9i as a next step over ezetimibe  Will apply for PA for PCSK9i; will inform patient upon approval  Patient to reduce carbohydrate and fat intake and start exercising regularly will help reduce TG to goal, in future if TG not at goal can consider adding Vascepa 2 gm twice daily  Lipid lab due in 2-3 months after starting [facility]

## 2022-11-03 NOTE — Telephone Encounter (Signed)
Pharmacy Patient Advocate Encounter   Received notification from Pt Calls Messages that prior authorization for repatha is required/requested.   Insurance verification completed.   The patient is insured through [employer] .   Per test claim: The current 11/03/22 day co-pay is, $20.00.  No PA needed at this time. This test claim was processed through [facility]- copay amounts may vary at other pharmacies due to pharmacy/plan contracts, or as the patient moves through the different stages of their insurance plan.

## 2022-11-03 NOTE — Progress Notes (Signed)
Patient ID: Michael Kim                 DOB: [birth year]                    MRN: [id number]      HPI: Michael Kim is a 52 y.o. male patient referred to lipid clinic by Michael Kim. PMH is significant for hypertension, PAF, OSA, HLD, obesity 30-34.9,statin intolerance    Patient presented today for lipid clinic. Reports he does not feel good on the current statin. Feels tired all the time. And muscle cramps occasionally. Never tried half a dose. His diet needs improvement and he does not do any exercise other than being active at work. He has tried Crestor in the past it was affecting his mood and was making him tired.   Reviewed options for lowering LDL cholesterol, including ezetimibe, PCSK-9 inhibitors, bempedoic acid and inclisiran.  Discussed mechanisms of action, dosing, side effects and potential decreases in LDL cholesterol.  Also reviewed cost information and potential options for patient assistance.  Current Medications: atorvastatin 40 mg daily  Intolerances: Atorvastatin pravastatin and Crestor- tiredness and mood swings  Risk Factors: HLD, OSA, family hx of CAD PGF had MI < 57, PGM stroke at age of 26  LDL goal: < 70 mg/dl   Diet: breakfast: protein shakes  Salads, with protein Pizza. Eat out too much   Exercise: walking -8 - 10 k steps 5 days a week at work place   Family History:  Relation Problem Comments  Mother - Michael Kim (Alive) Cancer lymphoma  Lymphoma     Father - Michael Kim (Alive) Colon polyps (Age: 69)   Hyperlipidemia   Kidney disease     Paternal Uncle Colon cancer (Age: 39)     Maternal Grandmother - Michael Kim Cancer (Age: 66) fatal colon  Depression     Paternal Grandfather Heart disease MI at age        Labs: Lipid Panel     Component Value Date/Time   CHOL 196 06/22/2022 0835   TRIG 275.0 (H) 06/22/2022 0835   HDL 66.80 06/22/2022 0835   CHOLHDL 3 06/22/2022 0835   VLDL 55.0 (H) 06/22/2022 0835   LDLCALC 87 10/26/2021 1423   LDLDIRECT 97.0  06/22/2022 0835    Past Medical History:  Diagnosis Date   Atrial fibrillation with rapid ventricular response (HCC)    GERD (gastroesophageal reflux disease)    Hyperlipidemia    on meds   Other testicular hypofunction    Sleep apnea    mouthpiece used nightly   Unspecified vitamin D deficiency     Current Outpatient Medications on File Prior to Visit  Medication Sig Dispense Refill   apixaban (ELIQUIS) 5 MG TABS tablet Take 5 mg by mouth as needed (for heart).     Cholecalciferol (VITAMIN D) 125 MCG (5000 UT) CAPS Take 1 capsule by mouth 2 (two) times daily.     cyclobenzaprine (FLEXERIL) 10 MG tablet Take 10 mg by mouth 3 (three) times daily as needed for muscle spasms.     diltiazem (CARDIZEM) 30 MG tablet Take 1 tablet every 4 hours AS NEEDED for heart rate >100 30 tablet 1   Psyllium (METAMUCIL FIBER PO) Take 1 Dose by mouth daily.     psyllium (METAMUCIL) 58.6 % packet Take 1 packet by mouth daily.     tadalafil (CIALIS) 20 MG tablet Take 1/2 to 1 tablet every 2 to 3 days as needed for  [name] 30 tablet 0   Testosterone 1.62 % GEL APPLY TWO PUMPS EXTERNALLY ON EACH ARM ONCE DAILY. 450 g 0   valACYclovir (VALTREX) 500 MG tablet Take 1 tablet (500 mg total) by mouth 2 (two) times daily as needed. 30 tablet 0   No current facility-administered medications on file prior to visit.    Allergies  Allergen Reactions   Pravachol [Pravastatin] Other (See Comments)    Fatigue   Rosuvastatin Other (See Comments)    "Feel strange"    Assessment/Plan:  1. Hyperlipidemia -  Problem  Mixed Hyperlipidemia   Current Medications: atorvastatin 40 mg daily  Intolerances: Atorvastatin pravastatin and Crestor- tiredness and mood swings  Risk Factors: HLD, OSA, family hx of CAD PGF had MI < 14, PGM stroke at age of 17  LDL goal: < 70 mg/dl     Mixed hyperlipidemia Assessment:  LDL goal: < 70 mg/dl last LDLc  97 mg/dl (96/0454) Intolerance to high intensity statins - Crestor -mood  swing and persistent tiredness, Lipitor 40 mg, pravastatin  - persistent tiredness Discussed next potential options (ezetimibe,PCSK-9 inhibitors, bempedoic acid and inclisiran); cost, dosing efficacy, side effects  Need to lower carb intake to improve TG and start doing regular exercise   Plan: Lower dose atorvastatin to 20 mg daily if not tolerated reduce dose to 10 mg daily or every other day  LDLc still above the goal on high intensity statin and with dose reduction, to improve tolerability,will loose some LDLc and TG  lowering efficacy; reasonable to consider PCSK9i as a next step over ezetimibe  Will apply for PA for PCSK9i; will inform patient upon approval  Patient to reduce carbohydrate and fat intake and start exercising regularly will help reduce TG to goal, in future if TG not at goal can consider adding Vascepa 2 gm twice daily  Lipid lab due in 2-3 months after starting PCSK9i    Thank you,  Michael Kim, Pharm.D [facility] of [facility] [address], [place], [place] [address]  Phone: [phone]; Fax: [phone]

## 2022-11-08 NOTE — Telephone Encounter (Signed)
No PA needed for PCSK9i, patient made aware see other encounter

## 2022-11-09 MED ORDER — REPATHA SURECLICK 140 MG/ML ~~LOC~~ SOAJ: 140.0000 mg | SUBCUTANEOUS | 3 refills | Status: DC

## 2022-11-09 NOTE — Addendum Note (Signed)
Addended by: Tylene Fantasia on: 11/09/2022 09:11 AM   Modules accepted: Orders

## 2022-11-11 ENCOUNTER — Telehealth: Payer: Self-pay | Admitting: Cardiovascular Disease

## 2022-11-11 DIAGNOSIS — I480 Paroxysmal atrial fibrillation: Secondary | ICD-10-CM | POA: Diagnosis not present

## 2022-11-11 DIAGNOSIS — R002 Palpitations: Secondary | ICD-10-CM | POA: Diagnosis not present

## 2022-11-11 MED ORDER — REPATHA SURECLICK 140 MG/ML ~~LOC~~ SOAJ: 140.0000 mg | SUBCUTANEOUS | 3 refills | Status: DC

## 2022-11-11 NOTE — Telephone Encounter (Signed)
*  STAT* If patient is at the pharmacy, call can be transferred to refill team.   1. Which medications need to be refilled? (please list name of each medication and dose if known) Evolocumab (REPATHA SURECLICK) 140 MG/ML SOAJ  2. Which pharmacy/location (including street and city if local pharmacy) is medication to be sent to? [id number] [place], [place] - [address]   3. Do they need a 30 day or 90 day supply?   90 day supply  See previous encounter. [facility].

## 2022-11-15 ENCOUNTER — Other Ambulatory Visit (HOSPITAL_COMMUNITY): Payer: Self-pay

## 2022-11-22 ENCOUNTER — Other Ambulatory Visit: Payer: Self-pay | Admitting: Family Medicine

## 2022-11-22 DIAGNOSIS — E782 Mixed hyperlipidemia: Secondary | ICD-10-CM

## 2022-12-08 ENCOUNTER — Encounter: Payer: Self-pay | Admitting: Physician Assistant

## 2022-12-08 DIAGNOSIS — I471 Supraventricular tachycardia, unspecified: Secondary | ICD-10-CM | POA: Insufficient documentation

## 2022-12-08 HISTORY — DX: Supraventricular tachycardia, unspecified: I47.10

## 2023-02-01 LAB — LAB REPORT - SCANNED: EGFR: 103.4

## 2023-02-09 ENCOUNTER — Telehealth: Payer: Self-pay | Admitting: Pharmacist

## 2023-02-09 DIAGNOSIS — E782 Mixed hyperlipidemia: Secondary | ICD-10-CM

## 2023-02-09 NOTE — Telephone Encounter (Signed)
 Call pt to remind for f/u lipid lab - Repatha  started Oct 2024. Pt states he will go get lab done on Feb 10,2025.

## 2023-02-17 LAB — LIPID PANEL
Chol/HDL Ratio: 3.3 {ratio} (ref 0.0–5.0)
Cholesterol, Total: 227 mg/dL — ABNORMAL HIGH (ref 100–199)
HDL: 69 mg/dL (ref >39)
LDL Chol Calc (NIH): 102 mg/dL — ABNORMAL HIGH (ref 0–99)
Triglycerides: 337 mg/dL — ABNORMAL HIGH (ref 0–149)
VLDL Cholesterol Cal: 56 mg/dL — ABNORMAL HIGH (ref 5–40)

## 2023-02-21 ENCOUNTER — Encounter: Payer: Self-pay | Admitting: Pharmacist

## 2023-02-23 ENCOUNTER — Telehealth: Payer: Self-pay | Admitting: Pharmacist

## 2023-02-23 ENCOUNTER — Other Ambulatory Visit (HOSPITAL_COMMUNITY): Payer: Self-pay

## 2023-02-23 ENCOUNTER — Telehealth: Payer: Self-pay

## 2023-02-23 DIAGNOSIS — E782 Mixed hyperlipidemia: Secondary | ICD-10-CM

## 2023-02-23 MED ORDER — ICOSAPENT ETHYL 1 G PO CAPS: 2.0000 g | ORAL_CAPSULE | Freq: Two times a day (BID) | ORAL | 11 refills | Status: AC

## 2023-02-23 MED ORDER — ATORVASTATIN CALCIUM 40 MG PO TABS: 40.0000 mg | ORAL_TABLET | Freq: Every day | ORAL | 3 refills | Status: DC

## 2023-02-23 NOTE — Telephone Encounter (Signed)
Pharmacy Patient Advocate Encounter   Received notification from Physician's Office ([facility]) that prior authorization for VASCEPA is required/requested.   Insurance verification completed.   The patient is insured through [employer] .   Per test claim: PA required; PA submitted to above mentioned insurance via Prompt PA Key/confirmation #/[id number] Status is pending

## 2023-02-23 NOTE — Telephone Encounter (Signed)
Spoke to patient, Recent lab from urology  AST slightly elevated. will be restarting Lipitor 40 mg and continuing Repatha. Will initiate Vascepa 2 gm twice daily. Will repeat lab in 2 months.  For elevated TG significance of lifestyle interventions (alcohol reduction, regular exercise and heart healthy diet) discussed in depth.

## 2023-02-25 ENCOUNTER — Other Ambulatory Visit (HOSPITAL_COMMUNITY): Payer: Self-pay

## 2023-02-25 NOTE — Telephone Encounter (Signed)
Pharmacy Patient Advocate Encounter  Received notification from [facility] that Prior Authorization for VASCEPA has been APPROVED from 02/24/23 to 02/23/24

## 2023-05-09 NOTE — Addendum Note (Signed)
 Addended by: Elesia Pemberton K on: 05/09/2023 11:38 AM   Modules accepted: Orders

## 2023-05-09 NOTE — Telephone Encounter (Signed)
 Call pt to remind about f/u lab. States from past 2-3 weeks he ash been following healthy whole food diet and alcohol consumption has been improved. He walks 2-3 miles per day most days of the week. He has been taking statin, Repatha  and Vascepa  regularly. He will go for lab on May 9,2025. Will call back to discuss result.

## 2023-05-13 LAB — LIPID PANEL

## 2023-05-14 LAB — LIPID PANEL
Cholesterol, Total: 112 mg/dL (ref 100–199)
HDL: 74 mg/dL (ref >39)
LDL CALC COMMENT:: 1.5 ratio (ref 0.0–5.0)
LDL Chol Calc (NIH): 13 mg/dL (ref 0–99)
Triglycerides: 154 mg/dL — ABNORMAL HIGH (ref 0–149)
VLDL Cholesterol Cal: 25 mg/dL (ref 5–40)

## 2023-05-14 LAB — HEPATIC FUNCTION PANEL
ALT: 38 IU/L (ref 0–44)
AST: 26 IU/L (ref 0–40)
Albumin: 4.7 g/dL (ref 3.8–4.9)
Alkaline Phosphatase: 69 IU/L (ref 44–121)
Bilirubin Total: 0.9 mg/dL (ref 0.0–1.2)
Bilirubin, Direct: 0.34 mg/dL (ref 0.00–0.40)
Total Protein: 6.9 g/dL (ref 6.0–8.5)

## 2023-05-16 MED ORDER — ATORVASTATIN CALCIUM 20 MG PO TABS: 20.0000 mg | ORAL_TABLET | Freq: Every day | ORAL | 3 refills | Status: AC

## 2023-05-16 NOTE — Telephone Encounter (Signed)
 Lipid lab dicussed over the phone. Advised to lower Lipitor to 20 mg. Excellent response from Repatha  LDL is now 13 and Vascepa  lowered TG very close to the goal. Advised to continue Repatha  and Vascepa  2 gm twice daily

## 2023-05-16 NOTE — Addendum Note (Signed)
 Addended by: Hanifah Royse K on: 05/16/2023 09:45 AM   Modules accepted: Orders

## 2023-07-27 ENCOUNTER — Other Ambulatory Visit: Payer: Self-pay

## 2023-07-27 MED ORDER — APIXABAN 5 MG PO TABS: 5.0000 mg | ORAL_TABLET | Freq: Two times a day (BID) | ORAL | 2 refills | Status: DC

## 2023-07-27 NOTE — Telephone Encounter (Signed)
 Received faxed refill request for Eliquis  from [facility] [address] Pt last saw Glendia Ferrier, PA on 11/02/22, last labs 02/01/23 Creat 0.8, age 53, weight 105.2kg, based on specified criteria pt is on appropriate dosage of Eliquis  5mg  BID for afib.  Will refill rx.

## 2023-10-21 ENCOUNTER — Telehealth: Payer: Self-pay | Admitting: Cardiovascular Disease

## 2023-10-21 MED ORDER — REPATHA SURECLICK 140 MG/ML ~~LOC~~ SOAJ: 140.0000 mg | SUBCUTANEOUS | 3 refills | Status: DC

## 2023-10-21 NOTE — Telephone Encounter (Signed)
*  STAT* If patient is at the pharmacy, call can be transferred to refill team.   1. Which medications need to be refilled? (please list name of each medication and dose if known) Evolocumab  (REPATHA  SURECLICK) 140 MG/ML SOAJ    2. Would you like to learn more about the convenience, safety, & potential cost savings by using the [facility]? No    3. Are you open to using the [facility] (Type [facility]. No    4. Which pharmacy/location (including street and city if local pharmacy) is medication to be sent to? [id number] [place], [place] - [address]    5. Do they need a 30 day or 90 day supply? 30 day   Pt has appt 11/11 with J. Cleaver.

## 2023-10-21 NOTE — Telephone Encounter (Signed)
 Refill sent.

## 2023-10-27 ENCOUNTER — Encounter: Payer: Commercial Managed Care - PPO | Admitting: Nurse Practitioner

## 2023-11-13 NOTE — Progress Notes (Unsigned)
 Cardiology Clinic Note   Patient Name: Michael Kim Date of Encounter: 11/13/2023  Primary Care Provider:  Lendia Boby CROME, NP-C Primary Cardiologist:  Aleene Passe, MD (Inactive)  Patient Profile    Michael Kim 53 year old male presents to the clinic today for follow-up evaluation of his paroxysmal atrial fibrillation and precordial chest pain.  Past Medical History    Past Medical History:  Diagnosis Date   Atrial fibrillation with rapid ventricular response (HCC)    GERD (gastroesophageal reflux disease)    Hyperlipidemia    on meds   Other testicular hypofunction    Sleep apnea    mouthpiece used nightly   SVT (supraventricular tachycardia) 12/08/2022   Monitor 12/2022: NSR, 7 episodes of SVT (longest 17 beats, fastest 218 bpm), rare PACs, PVCs, no A-fib    Unspecified vitamin D  deficiency    Past Surgical History:  Procedure Laterality Date   COLONOSCOPY  02/2016   DJ-MAC-prep good-int hems-36yr recall- no polyps   SHOULDER SURGERY Right 2014   UV joint-collar bone    Allergies  Allergies  Allergen Reactions   Pravachol [Pravastatin] Other (See Comments)    Fatigue   Rosuvastatin  Other (See Comments)    Feel strange    History of Present Illness    Michael Kim has a PMH of paroxysmal atrial fibrillation, echocardiogram 05/07/2013 with mild LVH, EF 60-65%, no RWMA, and mild RAE, coronary calcium  score 07/22, hyperlipidemia, OSA, and precordial chest pain.  He was previously followed by Dr. Passe.  He was seen in follow-up by Glendia Ferrier, PA-C on 11/02/2022.  During that time he noted 3 episodes of atrial fibrillation over the prior 10 years.  He reported that each was approximately 3 years apart.  He reported that with the episodes he would feel a bird flying around his chest sensation.  During the episodes he felt that his speech was slowed and he had difficulty focusing.  He keeps a prescription of Eliquis  and diltiazem .  He takes the  medication immediately if episodes occur.  He denied taking the medications daily.  He did not tolerate CPAP for his OSA.  He uses a oral appliance which has improved his sleep significantly.  He occasionally awakes with palpitations if he has bad dreams.  During his previous evaluation he described occasional fleeting chest discomfort that he rated as a 2 or 3 out of 10.  The episodes would happen approximately once a month.  His chest discomfort was not exertional.  He had no associated symptoms.  He reported that he did not exercise regularly.  He denied chest pain with walking type activities.  He also noted that he felt somewhat rundown on a atorvastatin  and had taken other statin medications which also produce similar side effects.  He presents to the clinic today for follow-up evaluation and states***.  *** denies chest pain, shortness of breath, lower extremity edema, fatigue, palpitations, melena, hematuria, hemoptysis, diaphoresis, weakness, presyncope, syncope, orthopnea, and PND.   Paroxysmal atrial fibrillation-EKG today shows***.  Denies recent episodes of accelerated or irregular heartbeat.  CHA2DS2-VASc score 0.  Annual risk of stroke is 0.2%.  Cardiac event monitor 11/02/2022 showed mainly normal sinus rhythm, 7 episodes of SVT, and a rare premature atrial complexes and rare premature ventricular contractions Continue as needed diltiazem  30 mg every 4 hours as needed along with Eliquis  5 mg twice daily for recurrent episodes of atrial fibrillation. Avoid triggers caffeine, chocolate, EtOH, dehydration excetra. Increase physical activity as tolerated  Hyperlipidemia-LDL***. High-fiber diet Continue Repatha  Repeat fasting lipids and LFTs  OSA-reports compliance with oral appliance. Sleep hygiene instructions Avoid supine sleeping  Precordial chest pain-no chest pain today.  Previously had coronary calcium  scoring which produced a score of 0.  (7/22) Heart healthy low-sodium  diet Continue Repatha  Recommend coronary CTA  Disposition: Follow-up with Dr. Barbaraann or me in 9-12 months.  Home Medications    Prior to Admission medications   Medication Sig Start Date End Date Taking? Authorizing Provider  apixaban  (ELIQUIS ) 5 MG TABS tablet Take 1 tablet (5 mg total) by mouth 2 (two) times daily. 07/27/23   Lelon Hamilton T, PA-C  atorvastatin  (LIPITOR) 20 MG tablet Take 1 tablet (20 mg total) by mouth daily. 05/16/23 08/14/23  Nahser, Aleene PARAS, MD  Cholecalciferol (VITAMIN D ) 125 MCG (5000 UT) CAPS Take 1 capsule by mouth 2 (two) times daily.    [provider]  cyclobenzaprine  (FLEXERIL ) 10 MG tablet Take 10 mg by mouth 3 (three) times daily as needed for muscle spasms.    [provider]  diltiazem  (CARDIZEM ) 30 MG tablet Take 1 tablet every 4 hours AS NEEDED for heart rate >100 09/17/22   Nahser, Aleene PARAS, MD  Evolocumab  (REPATHA  SURECLICK) 140 MG/ML SOAJ Inject 140 mg into the skin every 14 (fourteen) days. 10/21/23   Lelon Hamilton T, PA-C  icosapent  Ethyl (VASCEPA ) 1 g capsule Take 2 capsules (2 g total) by mouth 2 (two) times daily. 02/23/23   Nahser, Aleene PARAS, MD  Psyllium (METAMUCIL FIBER PO) Take 1 Dose by mouth daily.    [provider]  psyllium (METAMUCIL) 58.6 % packet Take 1 packet by mouth daily.    [provider]  tadalafil  (CIALIS ) 20 MG tablet Take 1/2 to 1 tablet every 2 to 3 days as needed for [name] 10/24/19   McClanahan, Kyra, NP  Testosterone  1.62 % GEL APPLY TWO PUMPS EXTERNALLY ON EACH ARM ONCE DAILY. 07/16/22   Henson, Vickie L, NP-C  valACYclovir  (VALTREX ) 500 MG tablet Take 1 tablet (500 mg total) by mouth 2 (two) times daily as needed. 08/10/21   Laurice President, NP    Family History    Family History  Problem Relation Age of Onset   Lymphoma Mother    Cancer Mother        lymphoma   Colon polyps Father 52   Hyperlipidemia Father    Kidney disease Father    Colon cancer Paternal Uncle 62   Depression  Maternal Grandmother    Cancer Maternal Grandmother 47       fatal colon   Heart disease Paternal Grandfather    Stomach cancer Neg Hx    Esophageal cancer Neg Hx    Rectal cancer Neg Hx    He indicated that his mother is alive. He indicated that his father is alive. He indicated that the status of his maternal grandmother is unknown. He indicated that the status of his paternal grandfather is unknown. He indicated that the status of his paternal uncle is unknown. He indicated that the status of his neg hx is unknown.  Social History    Social History   Socioeconomic History   Marital status: Significant Other    Spouse name: Not on file   Number of children: Not on file   Years of education: Not on file   Highest education level: Not on file  Occupational History   Occupation: Golf Pro    Employer: [employer]    Comment:  [employer]  Tobacco Use   Smoking status: Former    Types: Cigars    Quit date: 07/29/2006    Years since quitting: 17.3   Smokeless tobacco: Never  Vaping Use   Vaping status: Never Used  Substance and Sexual Activity   Alcohol use: Yes    Alcohol/week: 0.0 - 12.0 standard drinks of alcohol    Comment: 3-4 BEERS PER DAY PER PT   Drug use: No   Sexual activity: Yes    Comment: Vasectomy  Other Topics Concern   Not on file  Social History Narrative   He is a golf pro at a [employer]   Social Drivers of Corporate Investment Banker Strain: Not on file  Food Insecurity: Not on file  Transportation Needs: Not on file  Physical Activity: Not on file  Stress: Not on file  Social Connections: Not on file  Intimate Partner Violence: Not on file     Review of Systems    General:  No chills, fever, night sweats or weight changes.  Cardiovascular:  No chest pain, dyspnea on exertion, edema, orthopnea, palpitations, paroxysmal nocturnal dyspnea. Dermatological: No rash, lesions/masses Respiratory: No cough, dyspnea Urologic: No hematuria,  dysuria Abdominal:   No nausea, vomiting, diarrhea, bright red blood per rectum, melena, or hematemesis Neurologic:  No visual changes, wkns, changes in mental status. All other systems reviewed and are otherwise negative except as noted above.  Physical Exam    VS:  There were no vitals taken for this visit. , BMI There is no height or weight on file to calculate BMI. GEN: Well nourished, well developed, in no acute distress. HEENT: normal. Neck: Supple, no JVD, carotid bruits, or masses. Cardiac: RRR, no murmurs, rubs, or gallops. No clubbing, cyanosis, edema.  Radials/DP/PT 2+ and equal bilaterally.  Respiratory:  Respirations regular and unlabored, clear to auscultation bilaterally. GI: Soft, nontender, nondistended, BS + x 4. MS: no deformity or atrophy. Skin: warm and dry, no rash. Neuro:  Strength and sensation are intact. Psych: Normal affect.  Accessory Clinical Findings    Recent Labs: 05/13/2023: ALT 38   Recent Lipid Panel    Component Value Date/Time   CHOL 112 05/13/2023 0828   TRIG 154 (H) 05/13/2023 0828   HDL 74 05/13/2023 0828   CHOLHDL 1.5 05/13/2023 0828   CHOLHDL 3 06/22/2022 0835   VLDL 55.0 (H) 06/22/2022 0835   LDLCALC 13 05/13/2023 0828   LDLCALC 87 10/26/2021 1423   LDLDIRECT 97.0 06/22/2022 0835    No BP recorded.  {Refresh Note OR Click here to enter BP  :1}***    ECG personally reviewed by me today- ***    Coronary calcium  scoring 07/30/2020  FINDINGS: Coronary Calcium  Score:   Left main: 0   Left anterior descending artery: 0   Left circumflex artery: 0   Right coronary artery: 0   Total: 0   Percentile: NA   Pericardium: Normal.   Ascending Aorta: Normal caliber.   Non-cardiac: See separate report from [facility].   IMPRESSION: Coronary calcium  score of 0.  Cardiac event monitor 12/07/2022    Predominant rhythm is sinus rhythm.   He had 7 episodes of supraventricular tachycardia.  The fastest interval lasted  12 beats with a maximum heart rate of 218.  The longest episode of SVT lasted 17 beats with an average rate of 135.   Rare premature atrial contractions and rare premature ventricular contractions     Patch Wear Time:  13  days and 17 hours (2024-11-07T14:02:12-498 to 2024-11-21T07:18:02-498)   Patient had a min HR of 44 bpm, max HR of 218 bpm, and avg HR of 82 bpm. Predominant underlying rhythm was Sinus Rhythm. 7 Supraventricular Tachycardia runs occurred, the run with the fastest interval lasting 12 beats with a max rate of 218 bpm, the  longest lasting 17 beats with an avg rate of 135 bpm. Isolated SVEs were rare (<1.0%), SVE Couplets were rare (<1.0%), and SVE Triplets were rare (<1.0%). Isolated VEs were rare (<1.0%), and no VE Couplets or VE Triplets were present.     Assessment & Plan   1.  ***   Josefa HERO. Deanne Bedgood NP-C     11/13/2023, 2:33 PM [facility] [address] [place], [place] [address] Office [phone]    Notice: This dictation was prepared with Dragon dictation along with smaller phrase technology. Any transcriptional errors that result from this process are unintentional and may not be corrected upon review.   I spent***minutes examining this patient, reviewing medications, and using patient centered shared decision making involving their cardiac care.   I spent  20 minutes reviewing past medical history,  medications, and prior cardiac tests.

## 2023-11-15 ENCOUNTER — Ambulatory Visit: Attending: General Practice | Admitting: General Practice

## 2023-11-15 ENCOUNTER — Encounter: Payer: Self-pay | Admitting: General Practice

## 2023-11-15 VITALS — BP 110/76 | HR 63 | Ht 72.0 in | Wt 216.0 lb

## 2023-11-15 DIAGNOSIS — I480 Paroxysmal atrial fibrillation: Secondary | ICD-10-CM

## 2023-11-15 DIAGNOSIS — G4733 Obstructive sleep apnea (adult) (pediatric): Secondary | ICD-10-CM

## 2023-11-15 DIAGNOSIS — E782 Mixed hyperlipidemia: Secondary | ICD-10-CM | POA: Diagnosis not present

## 2023-11-15 DIAGNOSIS — R072 Precordial pain: Secondary | ICD-10-CM | POA: Diagnosis not present

## 2023-11-15 MED ORDER — REPATHA SURECLICK 140 MG/ML ~~LOC~~ SOAJ: 140.0000 mg | SUBCUTANEOUS | 3 refills | Status: AC

## 2023-11-15 MED ORDER — DILTIAZEM HCL 30 MG PO TABS: ORAL_TABLET | ORAL | 2 refills | Status: AC

## 2023-11-15 MED ORDER — APIXABAN 5 MG PO TABS: 5.0000 mg | ORAL_TABLET | Freq: Two times a day (BID) | ORAL | 2 refills | Status: AC

## 2023-11-15 NOTE — Patient Instructions (Signed)
 Medication Instructions:  Your physician recommends that you continue on your current medications as directed. Please refer to the Current Medication list given to you today. *If you need a refill on your cardiac medications before your next appointment, please call your pharmacy*  Lab Work: None ordered If you have labs (blood work) drawn today and your tests are completely normal, you will receive your results only by: MyChart Message (if you have MyChart) OR A paper copy in the mail If you have any lab test that is abnormal or we need to change your treatment, we will call you to review the results.  Testing/Procedures: None ordered  Follow-Up: At [facility], you and your health needs are our priority.  As part of our continuing mission to provide you with exceptional heart care, our providers are all part of one team.  This team includes your primary Cardiologist (physician) and Advanced Practice Providers or APPs (Physician Assistants and Nurse Practitioners) who all work together to provide you with the care you need, when you need it.  Your next appointment:   9-12 month(s)  Provider:   Emeline Calender, MD  We recommend signing up for the patient portal called MyChart.  Sign up information is provided on this After Visit Summary.  MyChart is used to connect with patients for Virtual Visits (Telemedicine).  Patients are able to view lab/test results, encounter notes, upcoming appointments, etc.  Non-urgent messages can be sent to your provider as well.   To learn more about what you can do with MyChart, go to [email or web address].   Other Instructions
